# Patient Record
Sex: Female | Born: 1970 | Race: Black or African American | Hispanic: No | Marital: Single | Smoking: Never smoker
Health system: Southern US, Community
[De-identification: ages and names within clinical notes are randomized; demographics above are authoritative.]

## PROBLEM LIST (undated history)

## (undated) DIAGNOSIS — R12 Heartburn: Secondary | ICD-10-CM

## (undated) DIAGNOSIS — M349 Systemic sclerosis, unspecified: Secondary | ICD-10-CM

## (undated) DIAGNOSIS — K5792 Diverticulitis of intestine, part unspecified, without perforation or abscess without bleeding: Secondary | ICD-10-CM

## (undated) DIAGNOSIS — A6009 Herpesviral infection of other urogenital tract: Secondary | ICD-10-CM

## (undated) HISTORY — DX: Diverticulitis of intestine, part unspecified, without perforation or abscess without bleeding: K57.92

## (undated) HISTORY — DX: Systemic sclerosis, unspecified: M34.9

## (undated) HISTORY — PX: DILATION AND CURETTAGE OF UTERUS: SHX78

## (undated) HISTORY — PX: COLONOSCOPY: SHX174

---

## 2000-12-19 ENCOUNTER — Emergency Department (HOSPITAL_COMMUNITY): Admission: EM | Admit: 2000-12-19 | Discharge: 2000-12-19 | Payer: Self-pay | Admitting: Emergency Medicine

## 2000-12-19 ENCOUNTER — Encounter: Payer: Self-pay | Admitting: Emergency Medicine

## 2002-12-27 ENCOUNTER — Encounter: Payer: Self-pay | Admitting: Emergency Medicine

## 2002-12-27 ENCOUNTER — Emergency Department (HOSPITAL_COMMUNITY): Admission: EM | Admit: 2002-12-27 | Discharge: 2002-12-27 | Payer: Self-pay | Admitting: Emergency Medicine

## 2003-04-03 ENCOUNTER — Emergency Department (HOSPITAL_COMMUNITY): Admission: EM | Admit: 2003-04-03 | Discharge: 2003-04-03 | Payer: Self-pay | Admitting: Emergency Medicine

## 2004-10-22 ENCOUNTER — Emergency Department (HOSPITAL_COMMUNITY): Admission: EM | Admit: 2004-10-22 | Discharge: 2004-10-23 | Payer: Self-pay | Admitting: Emergency Medicine

## 2005-10-23 ENCOUNTER — Emergency Department (HOSPITAL_COMMUNITY): Admission: EM | Admit: 2005-10-23 | Discharge: 2005-10-24 | Payer: Self-pay | Admitting: *Deleted

## 2007-04-08 IMAGING — CT CT ABDOMEN W/ CM
1 of 3 series · 14 of 32 positions shown, 19 images · IV contrast (CONTRAST)
Comparison: None

ABDOMEN CT WITH CONTRAST

CLINICAL DATA: Abdominal pain
TECHNIQUE: Multidetector CT imaging of the abdomen and pelvis was performed
following the standard protocol during bolus administration of intravenous
contrast.

Contrast:  100 cc Omnipaque 300

[Series 528: — · axial · 0.63mm/px · z∈[+1219,+1579]mm · 14 of 82 slices shown, 19 images]
[im 5/82  soft-tissue]
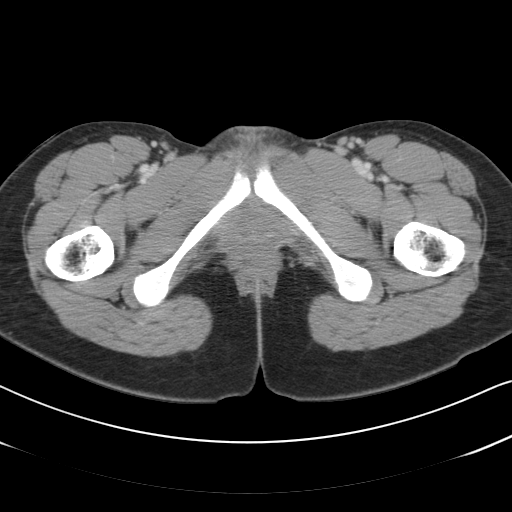
[im 5/82  bone]
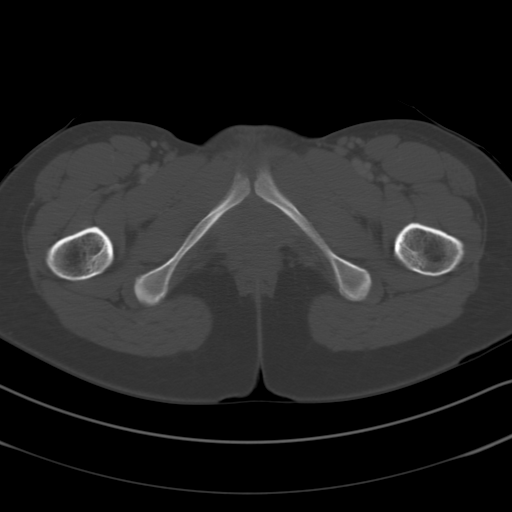
[im 10/82  soft-tissue]
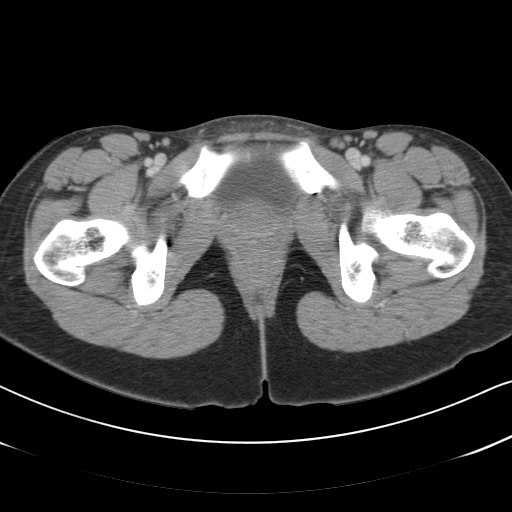
[im 20/82  soft-tissue]
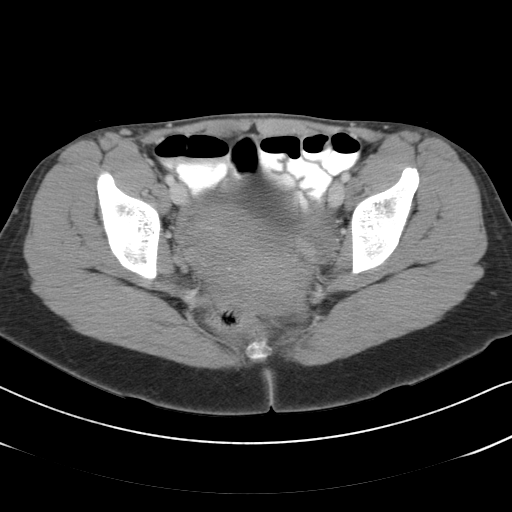
[im 24/82  soft-tissue]
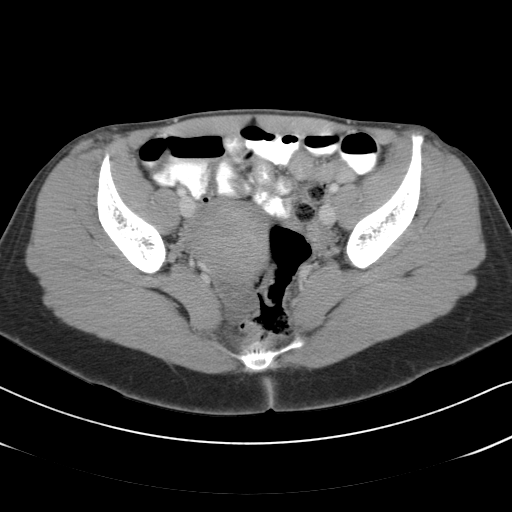
[im 29/82  soft-tissue]
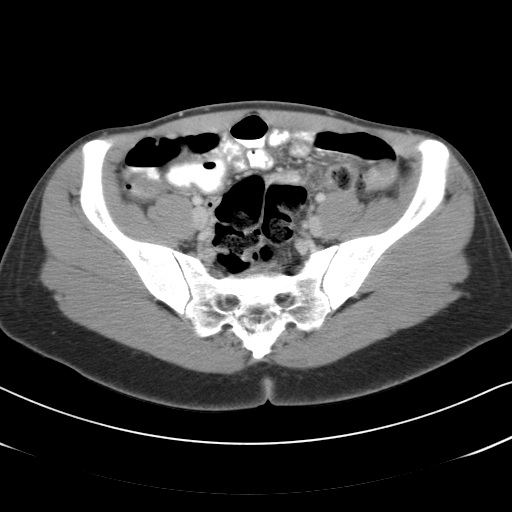
[im 34/82  soft-tissue]
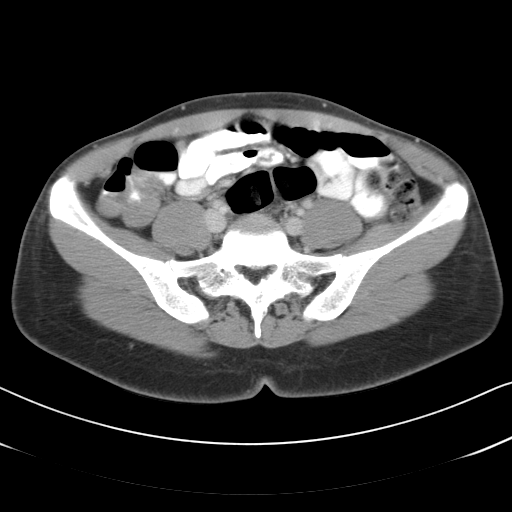
[im 43/82  soft-tissue]
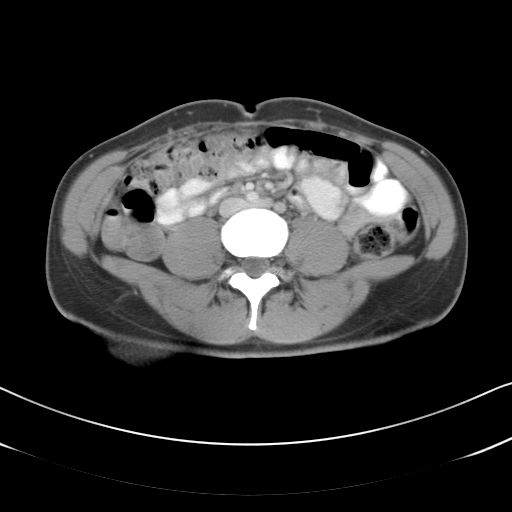
[im 48/82  soft-tissue]
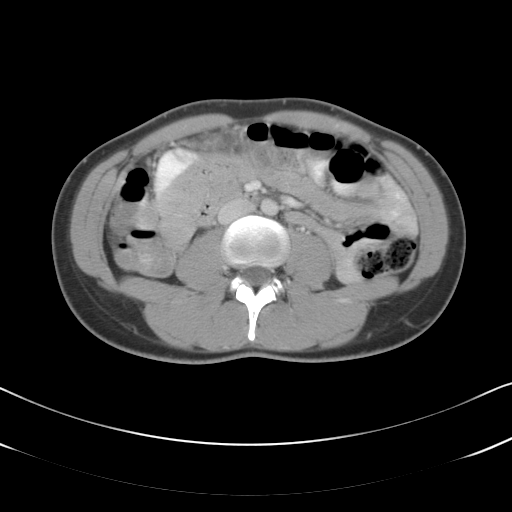
[im 53/82  soft-tissue]
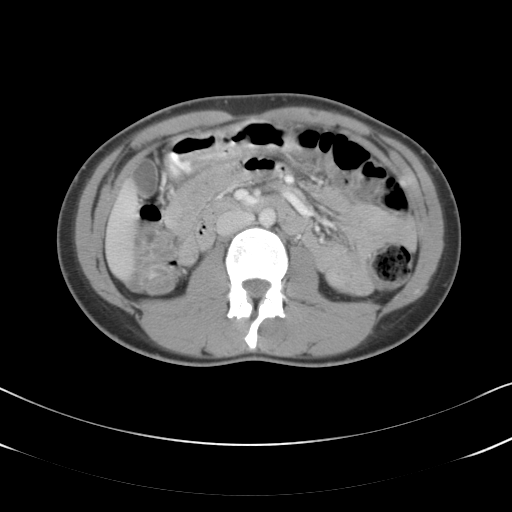
[im 53/82  bone]
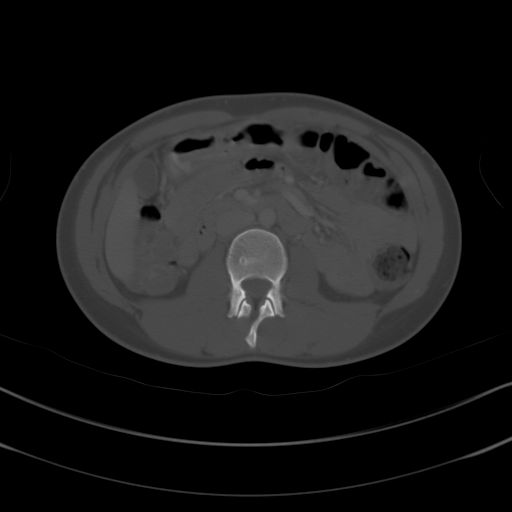
[im 58/82  soft-tissue]
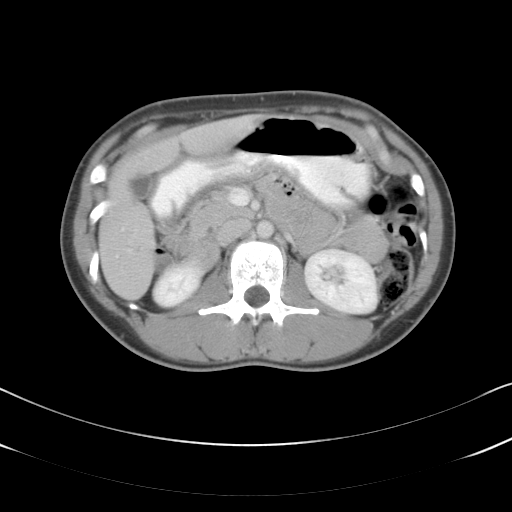
[im 62/82  soft-tissue]
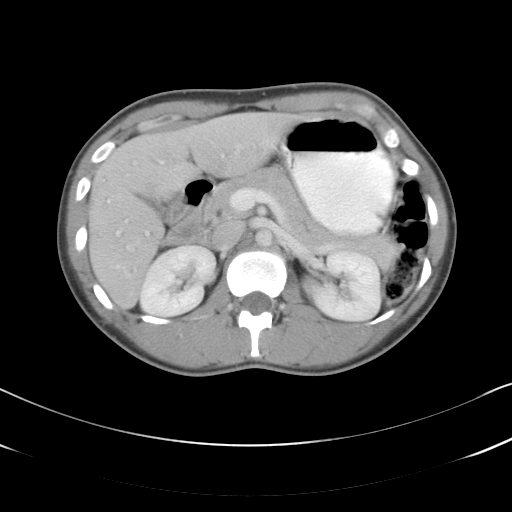
[im 62/82  lung]
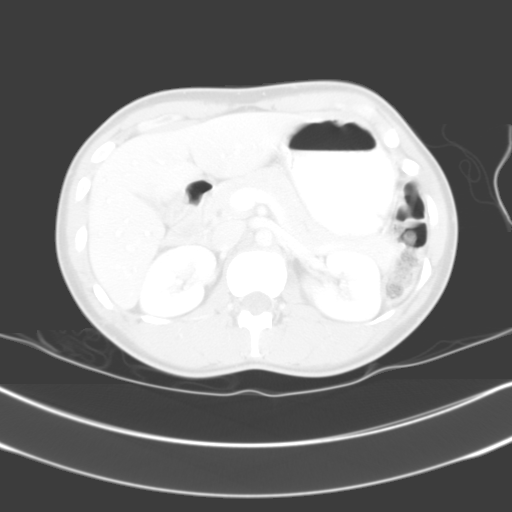
[im 67/82  lung]
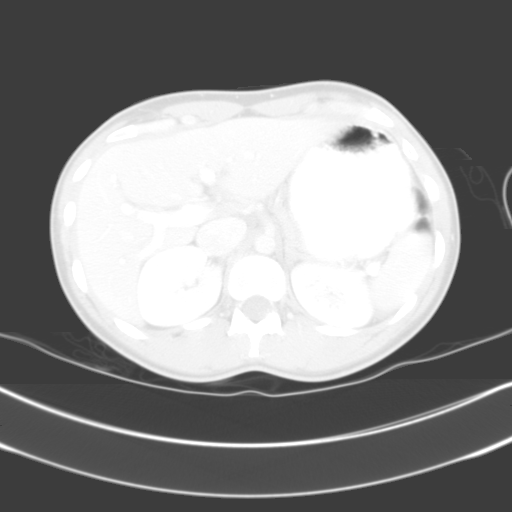
[im 72/82  soft-tissue]
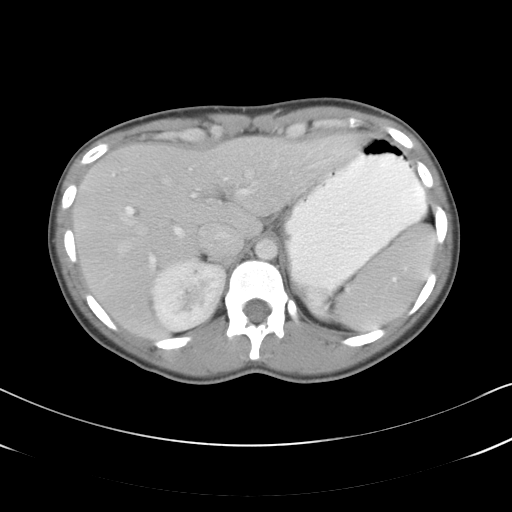
[im 72/82  lung]
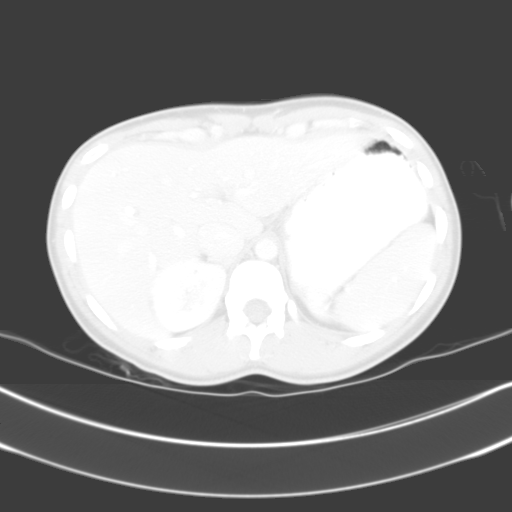
[im 77/82  soft-tissue]
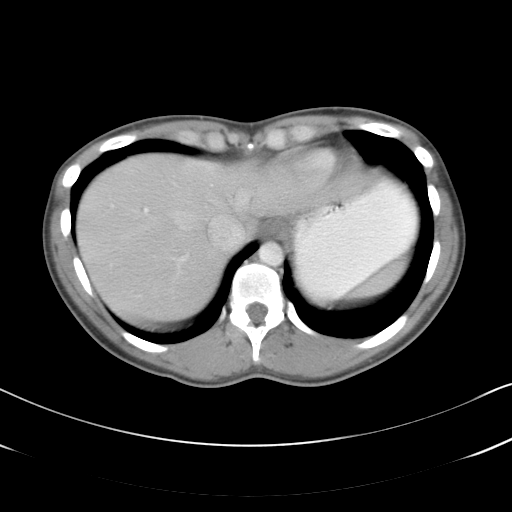
[im 77/82  lung]
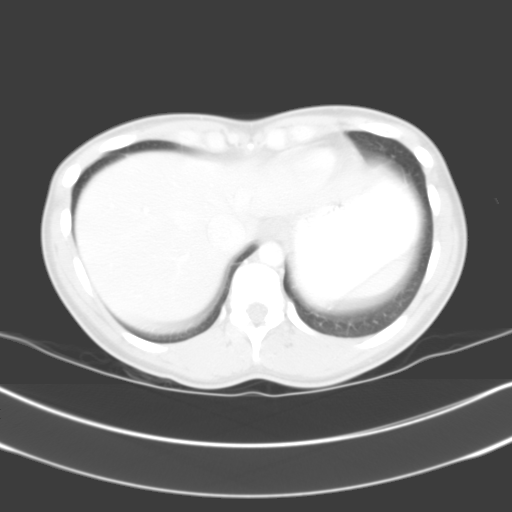

[14 of 32 positions shown; findings below may reference images not displayed]

FINDINGS: Solid organs have an unremarkable appearance. Gallbladder mildly
contracted but grossly unremarkable. Lung bases clear.

Although the colon is Decompressed, there appears to be mild wall thickening
within the right side of the colon and the transverse colon suggesting the
possibility of mild colitis. Moderate amount of stool within the left side of
the colon. No free fluid or free air.

IMPRESSION

Question mild colonic wall thickening in the right side of the colon and
transverse colon. The colon is decompressed and difficult to evaluate.

PELVIS CT WITH CONTRAST
FINDINGS: Appendix is filled with gas and is normal. There is a small to
moderate amount of free fluid in the pelvis. Within the right ovary, there
appears to be a partially collapsed cyst which could represent a ruptured cyst.
This measures 1.4 cm in greatest diameter. Uterus and left adnexa unremarkable.
Bowel grossly unremarkable within the pelvis.

IMPRESSION

Collapsing or ruptured right ovarian cyst. Small to moderate free fluid.

## 2010-04-26 ENCOUNTER — Emergency Department (HOSPITAL_COMMUNITY): Admission: EM | Admit: 2010-04-26 | Discharge: 2010-04-26 | Payer: Self-pay | Admitting: Emergency Medicine

## 2011-06-28 NOTE — L&D Delivery Note (Signed)
Delivery Note At 4:56 PM a viable female was delivered via Vaginal, Spontaneous Delivery (Presentation: Left Occiput Anterior).  APGAR: 8, 9; weight .   Placenta status: Intact, Spontaneous.  Cord: 3 vessels with the following complications: .    Anesthesia: Epidural  Episiotomy: None Lacerations: None Suture Repair: none Est. Blood Loss (mL): 300  Mom to postpartum.  Baby to mother's skin and stable.  Felix Pacini 05/02/2012, 5:30 PM

## 2011-10-31 ENCOUNTER — Other Ambulatory Visit (HOSPITAL_COMMUNITY)
Admission: RE | Admit: 2011-10-31 | Discharge: 2011-10-31 | Disposition: A | Discharge status: Home / Self Care | Payer: Medicaid Other | Source: Ambulatory Visit | Attending: Obstetrics and Gynecology | Admitting: Obstetrics and Gynecology

## 2011-10-31 DIAGNOSIS — Z1159 Encounter for screening for other viral diseases: Secondary | ICD-10-CM | POA: Insufficient documentation

## 2011-10-31 DIAGNOSIS — Z113 Encounter for screening for infections with a predominantly sexual mode of transmission: Secondary | ICD-10-CM | POA: Insufficient documentation

## 2011-10-31 DIAGNOSIS — Z01419 Encounter for gynecological examination (general) (routine) without abnormal findings: Secondary | ICD-10-CM | POA: Insufficient documentation

## 2011-10-31 LAB — OB RESULTS CONSOLE HEPATITIS B SURFACE ANTIGEN: Hepatitis B Surface Ag: NEGATIVE

## 2011-10-31 LAB — OB RESULTS CONSOLE ANTIBODY SCREEN: Antibody Screen: NEGATIVE

## 2011-10-31 LAB — OB RESULTS CONSOLE GBS: GBS: NEGATIVE

## 2011-10-31 LAB — OB RESULTS CONSOLE ABO/RH: RH Type: NEGATIVE

## 2011-10-31 LAB — OB RESULTS CONSOLE GC/CHLAMYDIA: Gonorrhea: NEGATIVE

## 2011-10-31 LAB — OB RESULTS CONSOLE RUBELLA ANTIBODY, IGM: Rubella: IMMUNE

## 2012-05-02 ENCOUNTER — Inpatient Hospital Stay (HOSPITAL_COMMUNITY)
Admission: AD | Admit: 2012-05-02 | Discharge: 2012-05-04 | DRG: 767 | Disposition: A | Discharge status: Home / Self Care | Payer: Medicaid Other | Source: Ambulatory Visit | Attending: Family Medicine | Admitting: Family Medicine

## 2012-05-02 ENCOUNTER — Encounter (HOSPITAL_COMMUNITY): Payer: Self-pay

## 2012-05-02 ENCOUNTER — Encounter (HOSPITAL_COMMUNITY): Payer: Self-pay | Admitting: Anesthesiology

## 2012-05-02 ENCOUNTER — Inpatient Hospital Stay (HOSPITAL_COMMUNITY): Payer: Medicaid Other | Admitting: Anesthesiology

## 2012-05-02 DIAGNOSIS — O09529 Supervision of elderly multigravida, unspecified trimester: Secondary | ICD-10-CM

## 2012-05-02 DIAGNOSIS — O4290 Premature rupture of membranes, unspecified as to length of time between rupture and onset of labor, unspecified weeks of gestation: Principal | ICD-10-CM | POA: Diagnosis present

## 2012-05-02 DIAGNOSIS — Z302 Encounter for sterilization: Secondary | ICD-10-CM

## 2012-05-02 HISTORY — DX: Herpesviral infection of other urogenital tract: A60.09

## 2012-05-02 LAB — POCT FERN TEST: POCT Fern Test: POSITIVE

## 2012-05-02 LAB — CBC
Hemoglobin: 11 g/dL — ABNORMAL LOW (ref 12.0–15.0)
RBC: 3.68 MIL/uL — ABNORMAL LOW (ref 3.87–5.11)
WBC: 8.7 10*3/uL (ref 4.0–10.5)

## 2012-05-02 LAB — RPR: RPR Ser Ql: NONREACTIVE

## 2012-05-02 MED ORDER — IBUPROFEN 600 MG PO TABS: 600.0000 mg | ORAL_TABLET | Freq: Four times a day (QID) | ORAL | Status: DC | PRN

## 2012-05-02 MED ORDER — EPHEDRINE 5 MG/ML INJ
10.0000 mg | INTRAVENOUS | Status: DC | PRN
  Filled 2012-05-02: qty 4

## 2012-05-02 MED ORDER — DIPHENHYDRAMINE HCL 25 MG PO CAPS: 25.0000 mg | ORAL_CAPSULE | Freq: Four times a day (QID) | ORAL | Status: DC | PRN

## 2012-05-02 MED ORDER — ACETAMINOPHEN 325 MG PO TABS
650.0000 mg | ORAL_TABLET | ORAL | Status: DC | PRN
Start: 2012-05-02 — End: 2012-05-02

## 2012-05-02 MED ORDER — LACTATED RINGERS IV SOLN
INTRAVENOUS | Status: DC
  Administered 2012-05-02 (×3): via INTRAVENOUS
  Administered 2012-05-02: 300 mL/h via INTRAVENOUS

## 2012-05-02 MED ORDER — PHENYLEPHRINE 40 MCG/ML (10ML) SYRINGE FOR IV PUSH (FOR BLOOD PRESSURE SUPPORT): 80.0000 ug | PREFILLED_SYRINGE | INTRAVENOUS | Status: DC | PRN

## 2012-05-02 MED ORDER — LIDOCAINE HCL (PF) 1 % IJ SOLN
INTRAMUSCULAR | Status: DC | PRN
  Administered 2012-05-02 (×4): 4 mL

## 2012-05-02 MED ORDER — FENTANYL 2.5 MCG/ML BUPIVACAINE 1/10 % EPIDURAL INFUSION (WH - ANES)
14.0000 mL/h | INTRAMUSCULAR | Status: DC
  Administered 2012-05-02: 14 mL/h via EPIDURAL
  Filled 2012-05-02: qty 125

## 2012-05-02 MED ORDER — PHENYLEPHRINE 40 MCG/ML (10ML) SYRINGE FOR IV PUSH (FOR BLOOD PRESSURE SUPPORT)
80.0000 ug | PREFILLED_SYRINGE | INTRAVENOUS | Status: DC | PRN
  Filled 2012-05-02: qty 5

## 2012-05-02 MED ORDER — WITCH HAZEL-GLYCERIN EX PADS: 1.0000 "application " | MEDICATED_PAD | CUTANEOUS | Status: DC | PRN

## 2012-05-02 MED ORDER — SENNOSIDES-DOCUSATE SODIUM 8.6-50 MG PO TABS
2.0000 | ORAL_TABLET | Freq: Every day | ORAL | Status: DC
  Administered 2012-05-02 – 2012-05-03 (×2): 2 via ORAL

## 2012-05-02 MED ORDER — LANOLIN HYDROUS EX OINT: TOPICAL_OINTMENT | CUTANEOUS | Status: DC | PRN

## 2012-05-02 MED ORDER — OXYCODONE-ACETAMINOPHEN 5-325 MG PO TABS: 1.0000 | ORAL_TABLET | ORAL | Status: DC | PRN

## 2012-05-02 MED ORDER — TETANUS-DIPHTH-ACELL PERTUSSIS 5-2.5-18.5 LF-MCG/0.5 IM SUSP: 0.5000 mL | Freq: Once | INTRAMUSCULAR | Status: DC

## 2012-05-02 MED ORDER — ONDANSETRON HCL 4 MG/2ML IJ SOLN: 4.0000 mg | Freq: Four times a day (QID) | INTRAMUSCULAR | Status: DC | PRN

## 2012-05-02 MED ORDER — OXYTOCIN 40 UNITS IN LACTATED RINGERS INFUSION - SIMPLE MED
1.0000 m[IU]/min | INTRAVENOUS | Status: DC
  Administered 2012-05-02: 2 m[IU]/min via INTRAVENOUS
  Filled 2012-05-02: qty 1000

## 2012-05-02 MED ORDER — LACTATED RINGERS IV SOLN: 500.0000 mL | Freq: Once | INTRAVENOUS | Status: DC

## 2012-05-02 MED ORDER — OXYTOCIN BOLUS FROM INFUSION: 500.0000 mL | INTRAVENOUS | Status: DC

## 2012-05-02 MED ORDER — LACTATED RINGERS IV SOLN: 500.0000 mL | INTRAVENOUS | Status: DC | PRN

## 2012-05-02 MED ORDER — ONDANSETRON HCL 4 MG PO TABS: 4.0000 mg | ORAL_TABLET | ORAL | Status: DC | PRN

## 2012-05-02 MED ORDER — IBUPROFEN 600 MG PO TABS
600.0000 mg | ORAL_TABLET | Freq: Four times a day (QID) | ORAL | Status: DC
  Administered 2012-05-02 – 2012-05-04 (×5): 600 mg via ORAL
  Filled 2012-05-02 (×5): qty 1

## 2012-05-02 MED ORDER — FLEET ENEMA 7-19 GM/118ML RE ENEM: 1.0000 | ENEMA | RECTAL | Status: DC | PRN

## 2012-05-02 MED ORDER — BENZOCAINE-MENTHOL 20-0.5 % EX AERO: 1.0000 "application " | INHALATION_SPRAY | CUTANEOUS | Status: DC | PRN

## 2012-05-02 MED ORDER — ZOLPIDEM TARTRATE 5 MG PO TABS: 5.0000 mg | ORAL_TABLET | Freq: Every evening | ORAL | Status: DC | PRN

## 2012-05-02 MED ORDER — ONDANSETRON HCL 4 MG/2ML IJ SOLN: 4.0000 mg | INTRAMUSCULAR | Status: DC | PRN

## 2012-05-02 MED ORDER — PRENATAL MULTIVITAMIN CH
1.0000 | ORAL_TABLET | Freq: Every day | ORAL | Status: DC
  Administered 2012-05-03 – 2012-05-04 (×2): 1 via ORAL
  Filled 2012-05-02 (×4): qty 1

## 2012-05-02 MED ORDER — DIPHENHYDRAMINE HCL 50 MG/ML IJ SOLN: 12.5000 mg | INTRAMUSCULAR | Status: DC | PRN

## 2012-05-02 MED ORDER — CITRIC ACID-SODIUM CITRATE 334-500 MG/5ML PO SOLN: 30.0000 mL | ORAL | Status: DC | PRN

## 2012-05-02 MED ORDER — LIDOCAINE HCL (PF) 1 % IJ SOLN
30.0000 mL | INTRAMUSCULAR | Status: DC | PRN
  Filled 2012-05-02: qty 30

## 2012-05-02 MED ORDER — EPHEDRINE 5 MG/ML INJ: 10.0000 mg | INTRAVENOUS | Status: DC | PRN

## 2012-05-02 MED ORDER — DIBUCAINE 1 % RE OINT: 1.0000 "application " | TOPICAL_OINTMENT | RECTAL | Status: DC | PRN

## 2012-05-02 MED ORDER — TERBUTALINE SULFATE 1 MG/ML IJ SOLN: 0.2500 mg | Freq: Once | INTRAMUSCULAR | Status: DC | PRN

## 2012-05-02 MED ORDER — SIMETHICONE 80 MG PO CHEW
80.0000 mg | CHEWABLE_TABLET | ORAL | Status: DC | PRN
Start: 2012-05-02 — End: 2012-05-04

## 2012-05-02 MED ORDER — OXYTOCIN 40 UNITS IN LACTATED RINGERS INFUSION - SIMPLE MED: 62.5000 mL/h | INTRAVENOUS | Status: DC

## 2012-05-02 NOTE — MAU Provider Note (Signed)
History     CSN: [id number]  Arrival date and time: 05/02/12 0234   None     Chief Complaint  Patient presents with  . Labor Eval   HPI Patient is a 41 yo G3P1011 at 39.0 presenting for ROM. Patient states she had a gush of clear fluid at 0100 and has been leaking since then. She denies contractions other than some irregular cramping. Good fetal movement, no frank bleeding. Patient is followed by [facility]. She is GBS negative, Rh negative and HSV positive. She did receive her 28 week RhoGam and has been on Valtrex since 34 weeks with no sign of an outbreak. We will need to obtain updated prenatal records from [facility] during clinic hours as patient states she has signed BTL papers.   She would like to breast feed. Her Pediatrician is in [place], [place]. She desires BTL for contraception.   OB History    Grav Para Term Preterm Abortions TAB SAB Ect Mult Living   3 1 1  1  1          Past Medical History  Diagnosis Date  . Herpes simplex of female genitalia     Past Surgical History  Procedure Date  . Dilation and curettage of uterus     History reviewed. No pertinent family history.  History  Substance Use Topics  . Smoking status: Never Smoker   . Smokeless tobacco: Not on file  . Alcohol Use: No    Allergies:  Allergies  Allergen Reactions  . Penicillins Swelling    Prescriptions prior to admission  Medication Sig Dispense Refill  . ferrous fumarate (FERRO-SEQUELS) 50 MG CR tablet Take 50 mg by mouth 3 (three) times daily with meals.      Marland Kitchen omeprazole (PRILOSEC) 10 MG capsule Take 10 mg by mouth daily.      . Prenatal Vit-Fe Fumarate-FA (MULTIVITAMIN-PRENATAL) 27-0.8 MG TABS Take 1 tablet by mouth daily.      . valACYclovir (VALTREX) 500 MG tablet Take 500 mg by mouth 2 (two) times daily.        Review of Systems  Constitutional: Negative for fever and chills.  Respiratory: Negative for shortness of breath.   Cardiovascular: Negative for chest  pain.  Gastrointestinal: Negative for nausea and vomiting.  Genitourinary: Negative for dysuria.  Musculoskeletal: Positive for back pain.  Skin: Negative for rash.  Neurological: Negative for dizziness and headaches.   Physical Exam   Blood pressure 125/78, pulse 88, temperature 97.8 F (36.6 C), temperature source Oral, resp. rate 18, height 5\' 5"  (1.651 m), weight 75.116 kg (165 lb 9.6 oz), last menstrual period 08/03/2011, SpO2 100.00%.  Physical Exam  Constitutional: She is oriented to person, place, and time. She appears well-developed and well-nourished. No distress.  HENT:  Head: Normocephalic and atraumatic.  Cardiovascular: Normal rate and regular rhythm.   Respiratory: Effort normal and breath sounds normal.  GI: Soft.       Gravid. Toco in place.  Musculoskeletal: Normal range of motion. She exhibits no edema and no tenderness.  Neurological: She is alert and oriented to person, place, and time.  Skin: Skin is dry.  Psychiatric: She has a normal mood and affect.    MAU Course  Procedures  Results for orders placed during the hospital encounter of 05/02/12 (from the past 24 hour(s))  POCT FERN TEST     Status: Normal   Collection Time   05/02/12  3:23 AM      Component  Value Range   POCT Fern Test Positive = ruptured amniotic membanes     SVE deferred since she is grossly ruptured without any pressure. Bedside ultrasound confirms vertex.  MDM Will admit to L&D  Assessment and Plan  41 yo G3P1011 at 39.0 admitted for ROM  Admit to L&D, attending Dr. Shawnie Pons Will need RhoGam for RhNeg Will get remainder of prenatal records during [facility] business hours for BTL papers and confirm GBS Expectant management, anticipate NSVD Discussed with Philipp Deputy, CNM   HAIRFORD, AMBER 05/02/2012, 3:44 AM   I have seen and examined this patient and I agree with the above. Cam Hai 7:38 AM 05/02/2012

## 2012-05-02 NOTE — H&P (Signed)
Chart reviewed and agree with management and plan.  

## 2012-05-02 NOTE — Progress Notes (Signed)
Patient ID: Erin Higgins, female   DOB: February 21, 1971, 41 y.o.   MRN: [id number] Erin Higgins is a 41 y.o. G3P1011 at [redacted]w[redacted]d by ultrasound admitted for rupture of membranes  Subjective: Patient is comfortable with epidural.   Objective: BP 101/66  Pulse 95  Temp 98 F (36.7 C) (Oral)  Resp 18  Ht 5\' 5"  (1.651 m)  Wt 75.099 kg (165 lb 9 oz)  BMI 27.55 kg/m2  SpO2 100%  LMP 08/03/2011     FHT:  FHR: 130's bpm, variability: moderate,  accelerations:  Present,  decelerations:  Absent UC:   irregular SVE:   Dilation: 6 Effacement (%): 80 Station: 0 Exam by:: B.Smith RN  Labs: Lab Results  Component Value Date   WBC 8.7 05/02/2012   HGB 11.0* 05/02/2012   HCT 32.1* 05/02/2012   MCV 87.2 05/02/2012   PLT 202 05/02/2012    Assessment / Plan: PROM Pitocin augmentation started on admission  Labor: Progressing normally and pitocin  Preeclampsia:  labs stable Fetal Wellbeing:  Category I Pain Control: epidural I/D:  n/a Anticipated MOD:  NSVD  Erin Higgins 05/02/2012, 4:34 PM

## 2012-05-02 NOTE — MAU Provider Note (Signed)
Chart reviewed and agree with management and plan.  

## 2012-05-02 NOTE — MAU Note (Signed)
Pt states she thinks her water broke around 0100. Pt states the fluid was clear with a tinge of blood. States she is having some contractions about every 15 minutes

## 2012-05-02 NOTE — Anesthesia Procedure Notes (Signed)
Epidural Patient location during procedure: OB Start time: 05/02/2012 1:37 PM  Staffing Performed by: anesthesiologist   Preanesthetic Checklist Completed: patient identified, site marked, surgical consent, pre-op evaluation, timeout performed, IV checked, risks and benefits discussed and monitors and equipment checked  Epidural Patient position: sitting Prep: site prepped and draped and DuraPrep Patient monitoring: continuous pulse ox and blood pressure Approach: midline Injection technique: LOR air  Needle:  Needle type: Tuohy  Needle gauge: 17 G Needle length: 9 cm and 9 Needle insertion depth: 5 cm cm Catheter type: closed end flexible Catheter size: 19 Gauge Catheter at skin depth: 10 cm Test dose: negative  Assessment Events: blood not aspirated, injection not painful, no injection resistance, negative IV test and no paresthesia  Additional Notes Discussed risk of headache, infection, bleeding, nerve injury and failed or incomplete block.  Patient voices understanding and wishes to proceed. Reason for block:procedure for pain

## 2012-05-02 NOTE — Progress Notes (Signed)
Erin Higgins is a 41 y.o. G3P1011 at [redacted]w[redacted]d by ultrasound admitted for rupture of membranes  Subjective: Patient is comfortable. Anticipating delivery, her last delivery was 12 years ago.   Objective: BP 112/71  Pulse 85  Temp 98.4 F (36.9 C) (Oral)  Resp 18  Ht 5\' 5"  (1.651 m)  Wt 75.099 kg (165 lb 9 oz)  BMI 27.55 kg/m2  SpO2 100%  LMP 08/03/2011      FHT:  FHR: 130's bpm, variability: moderate,  accelerations:  Present,  decelerations:  Absent UC:   irregular SVE:   Dilation: 2 Effacement (%): 50 Station: -3 Exam by:: B.Smith RN  Labs: Lab Results  Component Value Date   WBC 8.7 05/02/2012   HGB 11.0* 05/02/2012   HCT 32.1* 05/02/2012   MCV 87.2 05/02/2012   PLT 202 05/02/2012    Assessment / Plan: PROM Start Pitocin augmantation  Labor: pitocin started Preeclampsia:  labs stable Fetal Wellbeing:  Category I Pain Control:  IV pain meds available  I/D:  n/a Anticipated MOD:  NSVD  Kuneff, Renee 05/02/2012, 11:01 AM

## 2012-05-02 NOTE — Anesthesia Preprocedure Evaluation (Signed)
Anesthesia Evaluation  Patient identified by MRN, date of birth, ID band Patient awake    Reviewed: Allergy & Precautions, H&P , NPO status , Patient's Chart, lab work & pertinent test results, reviewed documented beta blocker date and time   History of Anesthesia Complications Negative for: history of anesthetic complications  Airway Mallampati: III TM Distance: >3 FB Neck ROM: full    Dental  (+) Teeth Intact   Pulmonary neg pulmonary ROS,  breath sounds clear to auscultation        Cardiovascular negative cardio ROS  Rhythm:regular Rate:Normal     Neuro/Psych negative neurological ROS  negative psych ROS   GI/Hepatic Neg liver ROS, GERD-  Medicated,  Endo/Other  negative endocrine ROS  Renal/GU negative Renal ROS     Musculoskeletal   Abdominal   Peds  Hematology negative hematology ROS (+)   Anesthesia Other Findings   Reproductive/Obstetrics (+) Pregnancy                           Anesthesia Physical Anesthesia Plan  ASA: II  Anesthesia Plan: Epidural   Post-op Pain Management:    Induction:   Airway Management Planned:   Additional Equipment:   Intra-op Plan:   Post-operative Plan:   Informed Consent: I have reviewed the patients History and Physical, chart, labs and discussed the procedure including the risks, benefits and alternatives for the proposed anesthesia with the patient or authorized representative who has indicated his/her understanding and acceptance.     Plan Discussed with:   Anesthesia Plan Comments:         Anesthesia Quick Evaluation  

## 2012-05-02 NOTE — H&P (Signed)
Erin Higgins is a 41 y.o. female G3P1011 at 39.0 presenting for ROM. Patient states she had a gush of clear fluid at 0100 and has been leaking since then. She denies contractions other than some irregular cramping. Good fetal movement, no frank bleeding. Patient is followed by [facility]. She is GBS negative, Rh negative and HSV positive. She did receive her 28 week RhoGam and has been on Valtrex since 34 weeks with no sign of an outbreak. We will need to obtain updated prenatal records from [facility] during clinic hours as patient states she has signed BTL papers.   She would like to breast feed. Her Pediatrician is in [place], [place]. She desires BTL for contraception. . Maternal Medical History:  Reason for admission: Reason for Admission:   nausea  OB History    Grav Para Term Preterm Abortions TAB SAB Ect Mult Living   3 1 1  1  1   1      Past Medical History  Diagnosis Date  . Herpes simplex of female genitalia    Past Surgical History  Procedure Date  . Dilation and curettage of uterus    Family History: family history is not on file. Social History:  reports that she has never smoked. She does not have any smokeless tobacco history on file. She reports that she does not drink alcohol or use illicit drugs.  Prenatal Transfer Tool  Maternal Diabetes: No Genetic Screening: Normal Maternal Ultrasounds/Referrals: Normal Fetal Ultrasounds or other Referrals:  None Maternal Substance Abuse:  No Significant Maternal Medications:  None Significant Maternal Lab Results:  Lab values include: Group B Strep negative, Rh negative, Other: HSV positive Other Comments:  None  Review of Systems  Constitutional: Negative for fever and chills.  Respiratory: Negative for shortness of breath.   Cardiovascular: Negative for chest pain.  Gastrointestinal: Negative for nausea and vomiting.  Genitourinary: Negative for dysuria.  Musculoskeletal: Positive for back pain.  Skin:  Negative for rash.  Neurological: Negative for dizziness and headaches.      Blood pressure 125/78, pulse 88, temperature 97.8 F (36.6 C), temperature source Oral, resp. rate 18, height 5\' 5"  (1.651 m), weight 75.116 kg (165 lb 9.6 oz), last menstrual period 08/03/2011, SpO2 100.00%. Exam Physical Exam  Constitutional: She is oriented to person, place, and time. She appears well-developed and well-nourished. No distress.  HENT:  Head: Normocephalic and atraumatic.  Neck: Normal range of motion.  Cardiovascular: Normal rate and regular rhythm.   No murmur heard. Respiratory: Effort normal and breath sounds normal. She has no wheezes.  GI: Soft.       Gravid. Toco in place.  Musculoskeletal: Normal range of motion. She exhibits no edema and no tenderness.  Neurological: She is alert and oriented to person, place, and time. No cranial nerve deficit.  Skin: Skin is dry. No rash noted.    Prenatal labs: ABO, Rh:  O neg (received 28 week Rhogam) Antibody:  Neg Rubella:  Immune RPR:   Neg HBsAg:   Neg HIV:   Neg GBS:   Neg  Assessment/Plan: 41 yo G3P1011 at 39.0 admitted for ROM Admit to L&D, attending Dr. Andrey Cota confirmed with bedside ultrasound Will need RhoGam for RhNeg  Will get remainder of prenatal records during [facility] business hours for BTL papers and confirm GBS  Breast feed Faculty peds Would like IV pain medications Expectant management, anticipate NSVD  Discussed with Philipp Deputy, CNM   HAIRFORD, AMBER 05/02/2012, 4:08  AM  I have seen and examined this patient and I agree with the above. Cam Hai 7:33 AM 05/02/2012

## 2012-05-03 ENCOUNTER — Inpatient Hospital Stay (HOSPITAL_COMMUNITY): Payer: Medicaid Other | Admitting: Anesthesiology

## 2012-05-03 ENCOUNTER — Encounter (HOSPITAL_COMMUNITY)
Admission: AD | Disposition: A | Discharge status: Home / Self Care | Payer: Self-pay | Source: Ambulatory Visit | Attending: Family Medicine

## 2012-05-03 ENCOUNTER — Encounter (HOSPITAL_COMMUNITY): Payer: Self-pay | Admitting: Anesthesiology

## 2012-05-03 DIAGNOSIS — Z302 Encounter for sterilization: Secondary | ICD-10-CM

## 2012-05-03 HISTORY — PX: TUBAL LIGATION: SHX77

## 2012-05-03 LAB — ABO/RH: ABO/RH(D): O NEG

## 2012-05-03 LAB — CBC
MCH: 27.9 pg (ref 26.0–34.0)
MCHC: 32.1 g/dL (ref 30.0–36.0)
MCV: 86.8 fL (ref 78.0–100.0)
Platelets: 192 10*3/uL (ref 150–400)
RDW: 15.5 % (ref 11.5–15.5)

## 2012-05-03 SURGERY — LIGATION, FALLOPIAN TUBE, POSTPARTUM
Anesthesia: Epidural | Site: Abdomen | Laterality: Bilateral | Wound class: Clean

## 2012-05-03 MED ORDER — MIDAZOLAM HCL 2 MG/2ML IJ SOLN: 0.5000 mg | Freq: Once | INTRAMUSCULAR | Status: DC | PRN

## 2012-05-03 MED ORDER — MIDAZOLAM HCL 5 MG/5ML IJ SOLN
INTRAMUSCULAR | Status: DC | PRN
  Administered 2012-05-03: 2 mg via INTRAVENOUS

## 2012-05-03 MED ORDER — MEPERIDINE HCL 25 MG/ML IJ SOLN: 6.2500 mg | INTRAMUSCULAR | Status: DC | PRN

## 2012-05-03 MED ORDER — LIDOCAINE-EPINEPHRINE (PF) 2 %-1:200000 IJ SOLN
INTRAMUSCULAR | Status: AC
  Filled 2012-05-03: qty 20

## 2012-05-03 MED ORDER — SODIUM BICARBONATE 8.4 % IV SOLN
INTRAVENOUS | Status: AC
  Filled 2012-05-03: qty 50

## 2012-05-03 MED ORDER — PROMETHAZINE HCL 25 MG/ML IJ SOLN: 6.2500 mg | INTRAMUSCULAR | Status: DC | PRN

## 2012-05-03 MED ORDER — METOCLOPRAMIDE HCL 10 MG PO TABS
10.0000 mg | ORAL_TABLET | Freq: Once | ORAL | Status: AC
  Administered 2012-05-03: 10 mg via ORAL
  Filled 2012-05-03: qty 1

## 2012-05-03 MED ORDER — SODIUM CHLORIDE 0.9 % IV SOLN: INTRAVENOUS | Status: DC

## 2012-05-03 MED ORDER — BUPIVACAINE HCL (PF) 0.5 % IJ SOLN
INTRAMUSCULAR | Status: AC
  Filled 2012-05-03: qty 30

## 2012-05-03 MED ORDER — SODIUM BICARBONATE 8.4 % IV SOLN
INTRAVENOUS | Status: DC | PRN
  Administered 2012-05-03: 5 mL via EPIDURAL
  Administered 2012-05-03: 3 mL via EPIDURAL

## 2012-05-03 MED ORDER — KETOROLAC TROMETHAMINE 30 MG/ML IJ SOLN: 15.0000 mg | Freq: Once | INTRAMUSCULAR | Status: DC | PRN

## 2012-05-03 MED ORDER — BUPIVACAINE HCL 0.5 % IJ SOLN
INTRAMUSCULAR | Status: DC | PRN
  Administered 2012-05-03: 10 mL

## 2012-05-03 MED ORDER — 0.9 % SODIUM CHLORIDE (POUR BTL) OPTIME
TOPICAL | Status: DC | PRN
  Administered 2012-05-03: 1000 mL

## 2012-05-03 MED ORDER — KETOROLAC TROMETHAMINE 30 MG/ML IJ SOLN
INTRAMUSCULAR | Status: DC | PRN
  Administered 2012-05-03: 30 mg via INTRAVENOUS

## 2012-05-03 MED ORDER — KETOROLAC TROMETHAMINE 30 MG/ML IJ SOLN
INTRAMUSCULAR | Status: AC
  Filled 2012-05-03: qty 1

## 2012-05-03 MED ORDER — RHO D IMMUNE GLOBULIN 1500 UNIT/2ML IJ SOLN
300.0000 ug | Freq: Once | INTRAMUSCULAR | Status: AC
  Administered 2012-05-03: 300 ug via INTRAMUSCULAR
  Filled 2012-05-03: qty 2

## 2012-05-03 MED ORDER — FAMOTIDINE 20 MG PO TABS
40.0000 mg | ORAL_TABLET | Freq: Once | ORAL | Status: AC
  Administered 2012-05-03: 40 mg via ORAL
  Filled 2012-05-03 (×2): qty 1

## 2012-05-03 MED ORDER — FENTANYL CITRATE 0.05 MG/ML IJ SOLN: 25.0000 ug | INTRAMUSCULAR | Status: DC | PRN

## 2012-05-03 MED ORDER — MIDAZOLAM HCL 2 MG/2ML IJ SOLN
INTRAMUSCULAR | Status: AC
  Filled 2012-05-03: qty 2

## 2012-05-03 MED ORDER — LACTATED RINGERS IV SOLN
INTRAVENOUS | Status: DC
  Administered 2012-05-03: 15:00:00 via INTRAVENOUS

## 2012-05-03 SURGICAL SUPPLY — 26 items
ADH SKN CLS APL DERMABOND .7 (GAUZE/BANDAGES/DRESSINGS) ×1
APL SKNCLS STERI-STRIP NONHPOA (GAUZE/BANDAGES/DRESSINGS) ×1
BENZOIN TINCTURE PRP APPL 2/3 (GAUZE/BANDAGES/DRESSINGS) ×1 IMPLANT
CATH ROBINSON RED A/P 16FR (CATHETERS) ×2 IMPLANT
CHLORAPREP W/TINT 26ML (MISCELLANEOUS) ×2 IMPLANT
CLIP FILSHIE TUBAL LIGA STRL (Clip) ×1 IMPLANT
CLOTH BEACON ORANGE TIMEOUT ST (SAFETY) ×2 IMPLANT
CONTAINER PREFILL 10% NBF 15ML (MISCELLANEOUS) ×2 IMPLANT
DERMABOND ADVANCED (GAUZE/BANDAGES/DRESSINGS) ×1
DERMABOND ADVANCED .7 DNX12 (GAUZE/BANDAGES/DRESSINGS) ×1 IMPLANT
DRSG COVADERM PLUS 2X2 (GAUZE/BANDAGES/DRESSINGS) ×1 IMPLANT
GLOVE ECLIPSE 7.0 STRL STRAW (GLOVE) ×2 IMPLANT
GLOVE INDICATOR 7.0 STRL GRN (GLOVE) ×4 IMPLANT
GOWN PREVENTION PLUS LG XLONG (DISPOSABLE) ×2 IMPLANT
GOWN STRL REIN XL XLG (GOWN DISPOSABLE) ×2 IMPLANT
NDL HYPO 25X1 1.5 SAFETY (NEEDLE) IMPLANT
NEEDLE HYPO 25X1 1.5 SAFETY (NEEDLE) ×2 IMPLANT
NS IRRIG 1000ML POUR BTL (IV SOLUTION) ×2 IMPLANT
PACK ABDOMINAL MINOR (CUSTOM PROCEDURE TRAY) ×2 IMPLANT
STRIP CLOSURE SKIN 1/4X4 (GAUZE/BANDAGES/DRESSINGS) ×1 IMPLANT
SUT VIC AB 0 CT1 27 (SUTURE) ×2
SUT VIC AB 0 CT1 27XBRD ANBCTR (SUTURE) ×1 IMPLANT
SUT VIC AB 4-0 PS2 27 (SUTURE) ×2 IMPLANT
SYR CONTROL 10ML LL (SYRINGE) ×1 IMPLANT
TOWEL OR 17X24 6PK STRL BLUE (TOWEL DISPOSABLE) ×4 IMPLANT
WATER STERILE IRR 1000ML POUR (IV SOLUTION) ×1 IMPLANT

## 2012-05-03 NOTE — Anesthesia Postprocedure Evaluation (Signed)
Anesthesia Post Note  Patient: Erin Higgins  Procedure(s) Performed: Procedure(s) (LRB): POST PARTUM TUBAL LIGATION (Bilateral)  Anesthesia type: Epidural  Patient location: PACU  Post pain: Pain level controlled  Post assessment: Post-op Vital signs reviewed  Last Vitals:  Filed Vitals:   05/03/12 1700  BP: 96/60  Pulse: 64  Temp:   Resp: 17    Post vital signs: Reviewed  Level of consciousness: awake  Complications: No apparent anesthesia complications

## 2012-05-03 NOTE — Op Note (Signed)
05/02/2012 - 05/03/2012  3:57 PM  PATIENT:  Erin Higgins  41 y.o. female  PRE-OPERATIVE DIAGNOSIS:  desire sterilization  POST-OPERATIVE DIAGNOSIS:  desire sterilization  PROCEDURE:  Procedure(s) (LRB) with comments: POST PARTUM TUBAL LIGATION (Bilateral) - with filshie clips  SURGEON:  Surgeon(s) and Role:    * Willodean Rosenthal, MD - Primary  PHYSICIAN ASSISTANT:   ASSISTANTS: none  ANESTHESIA:   epidural  EBL:  Total I/O In: 400 [I.V.:400] Out: 603 [Urine:600; Blood:3]  BLOOD ADMINISTERED:none  DRAINS: none   LOCAL MEDICATIONS USED:  MARCAINE     SPECIMEN:  No Specimen  DISPOSITION OF SPECIMEN:  N/A  COUNTS:  YES  TOURNIQUET:  * No tourniquets in log *  DICTATION: .Note written in EPIC  PLAN OF CARE: transfer back to mothe/baby unit    PATIENT DISPOSITION:  PACU - hemodynamically stable.   Delay start of Pharmacological VTE agent (>24hrs) due to surgical blood loss or risk of bleeding: yes  URINE OUTPUT:  100 ml of clear urine.  INDICATIONS: 41 y.o. [id number]  with undesired fertility,status post vaginal delivery, desires permanent sterilization.  Other reversible forms of contraception were discussed with patient; she declines all other modalities. Risks of procedure discussed with patient including but not limited to: risk of regret, permanence of method, bleeding, infection, injury to surrounding organs and need for additional procedures.  Failure risk of 0.5-1% with increased risk of ectopic gestation if pregnancy occurs was also discussed with patient.     FINDINGS:  Normal uterus, tubes, and ovaries.  PROCEDURE DETAILS: The patient was taken to the operating room where her epidural anesthesia was dosed up to surgical level and found to be adequate.  She was then placed in the dorsal supine position and prepped and draped in sterile fashion.  After an adequate timeout was performed, attention was turned to the patient's abdomen where a small  transverse skin incision was made under the umbilical fold. The incision was taken down to the layer of fascia using the scalpel, and fascia was incised, and extended bilaterally using Mayo scissors. The peritoneum was entered in a sharp fashion. Attention was then turned to the patient's uterus, and left fallopian tube was identified and followed out to the fimbriated end.  A Filshie clip was placed on the left fallopian tube about 3 cm from the cornual attachment, with care given to incorporate the underlying mesosalpinx.  A similar process was carried out on the right side allowing for bilateral tubal sterilization.  Good hemostasis was noted overall.  The instruments were then removed from the patient's abdomen and the fascial incision was repaired with 0 Vicryl, and the skin was closed with a 4-0 Vicryl subcuticular stitch. The patient tolerated the procedure well.  Instrument, sponge, and needle counts were correct times two.  The patient was then taken to the recovery room awake and in stable condition.

## 2012-05-03 NOTE — Transfer of Care (Signed)
Immediate Anesthesia Transfer of Care Note  Patient: Erin Higgins  Procedure(s) Performed: Procedure(s) (LRB) with comments: POST PARTUM TUBAL LIGATION (Bilateral) - with filshie clips  Patient Location: PACU  Anesthesia Type:Epidural  Level of Consciousness: oriented, sedated and patient cooperative  Airway & Oxygen Therapy: Patient Spontanous Breathing  Post-op Assessment: Report given to PACU RN and Post -op Vital signs reviewed and stable  Post vital signs: Reviewed and stable  Complications: No apparent anesthesia complications

## 2012-05-03 NOTE — Progress Notes (Signed)
Post Partum Day 1 Subjective: no complaints, up ad lib, voiding and NPO for Tubal ligation this afternoon at 2:30 pm. Consent signed and 30-day papers copy in chart. Patient confirms her  Desire for permanent sterilization.   Objective: Blood pressure 102/69, pulse 79, temperature 97.9 F (36.6 C), temperature source Oral, resp. rate 18, height 5\' 5"  (1.651 m), weight 165 lb 9 oz (75.099 kg), last menstrual period 08/03/2011, SpO2 100.00%, unknown if currently breastfeeding.  Physical Exam:  General: alert, cooperative and no distress Lochia: appropriate Uterine Fundus: deep in pelvis, u-4 Incision: n/a DVT Evaluation: No evidence of DVT seen on physical exam.   Basename 05/03/12 0535 05/02/12 0445  HGB 9.5* 11.0*  HCT 29.6* 32.1*    Assessment/Plan: Plan for discharge tomorrow and Contraception Tubal ligation this p.m. at 2:30.   LOS: 1 day   Erin Higgins V 05/03/2012, 7:37 AM

## 2012-05-03 NOTE — Progress Notes (Signed)
UR chart review completed.  

## 2012-05-03 NOTE — Plan of Care (Signed)
Problem: Consults Goal: Skin Care Protocol Initiated - if Braden Score 18 or less If consults are not indicated, leave blank or document N/A  Outcome: Completed/Met Date Met:  05/03/12 Small tubal incision covered with 2x2's

## 2012-05-03 NOTE — Anesthesia Preprocedure Evaluation (Signed)
Anesthesia Evaluation  Patient identified by MRN, date of birth, ID band Patient awake    Reviewed: Allergy & Precautions, H&P , NPO status , Patient's Chart, lab work & pertinent test results, reviewed documented beta blocker date and time   History of Anesthesia Complications Negative for: history of anesthetic complications  Airway Mallampati: II TM Distance: >3 FB Neck ROM: full    Dental  (+) Teeth Intact   Pulmonary neg pulmonary ROS,  breath sounds clear to auscultation  Pulmonary exam normal       Cardiovascular negative cardio ROS  Rhythm:regular Rate:Normal     Neuro/Psych negative neurological ROS  negative psych ROS   GI/Hepatic Neg liver ROS, GERD-  Medicated,  Endo/Other  negative endocrine ROS  Renal/GU negative Renal ROS     Musculoskeletal   Abdominal   Peds  Hematology negative hematology ROS (+)   Anesthesia Other Findings   Reproductive/Obstetrics (+) Pregnancy                           Anesthesia Physical  Anesthesia Plan  ASA: II  Anesthesia Plan: Epidural   Post-op Pain Management:    Induction:   Airway Management Planned:   Additional Equipment:   Intra-op Plan:   Post-operative Plan:   Informed Consent: I have reviewed the patients History and Physical, chart, labs and discussed the procedure including the risks, benefits and alternatives for the proposed anesthesia with the patient or authorized representative who has indicated his/her understanding and acceptance.   Dental Advisory Given  Plan Discussed with: CRNA and Surgeon  Anesthesia Plan Comments:         Anesthesia Quick Evaluation

## 2012-05-04 ENCOUNTER — Encounter (HOSPITAL_COMMUNITY): Payer: Self-pay | Admitting: Obstetrics & Gynecology

## 2012-05-04 LAB — RH IG WORKUP (INCLUDES ABO/RH): Unit division: 0

## 2012-05-04 MED ORDER — DIBUCAINE 1 % RE OINT: 1.0000 "application " | TOPICAL_OINTMENT | RECTAL | Status: DC | PRN

## 2012-05-04 MED ORDER — LANOLIN HYDROUS EX OINT: 1.0000 "application " | TOPICAL_OINTMENT | CUTANEOUS | Status: DC | PRN

## 2012-05-04 MED ORDER — BENZOCAINE-MENTHOL 20-0.5 % EX AERO: 1.0000 "application " | INHALATION_SPRAY | CUTANEOUS | Status: DC | PRN

## 2012-05-04 MED ORDER — WITCH HAZEL-GLYCERIN EX PADS: 1.0000 "application " | MEDICATED_PAD | CUTANEOUS | Status: DC | PRN

## 2012-05-04 MED ORDER — IBUPROFEN 600 MG PO TABS: 600.0000 mg | ORAL_TABLET | Freq: Four times a day (QID) | ORAL | Status: DC

## 2012-05-04 NOTE — Discharge Summary (Signed)
Obstetric Discharge Summary Reason for Admission: rupture of membranes Prenatal Procedures: ultrasound Intrapartum Procedures: spontaneous vaginal delivery Postpartum Procedures: P.P. tubal ligation Complications-Operative and Postpartum: none Hemoglobin  Date Value Range Status  05/03/2012 9.5* 12.0 - 15.0 g/dL Final     HCT  Date Value Range Status  05/03/2012 29.6* 36.0 - 46.0 % Final    Physical Exam:  General: alert and cooperative Lochia: appropriate Uterine Fundus: firm Incision: healing well, no significant drainage, no dehiscence, no significant erythema (BTL) DVT Evaluation: No evidence of DVT seen on physical exam. Negative Homan's sign. No cords or calf tenderness.  Discharge Diagnoses: Term Pregnancy-delivered  Discharge Information: Date: 05/04/2012 Activity: pelvic rest Diet: routine Medications: Ibuprofen Condition: stable Instructions: AVS Discharge to: home Follow-up Information    Schedule an appointment as soon as possible for a visit in 5 weeks to follow up. (make an appt wit your provider in 5-6 weeks)          Newborn Data: Live born female  Birth Weight: 6 lb 4 oz (2835 g) APGAR: 8, 9 Outpatient circ  Home with mother.  Felix Pacini 05/04/2012, 7:50 AM

## 2012-05-04 NOTE — Discharge Summary (Signed)
Attestation of Attending Supervision of Advanced Practitioner (CNM/NP): Evaluation and management procedures were performed by the Advanced Practitioner under my supervision and collaboration.  I have reviewed the Advanced Practitioner's note and chart, and I agree with the management and plan.  HARRAWAY-SMITH, Natasja Niday 8:00 AM     

## 2012-05-04 NOTE — Anesthesia Postprocedure Evaluation (Signed)
  Anesthesia Post-op Note  Patient: Erin Higgins  Procedure(s) Performed: * No procedures listed *  Patient Location: Mother/Baby  Anesthesia Type:Epidural  Level of Consciousness: awake  Airway and Oxygen Therapy: Patient Spontanous Breathing  Post-op Pain: none  Post-op Assessment: Patient's Cardiovascular Status Stable, Respiratory Function Stable, Patent Airway, No signs of Nausea or vomiting, Adequate PO intake, Pain level controlled, No headache, No backache, No residual numbness and No residual motor weakness  Post-op Vital Signs: Reviewed and stable  Complications: No apparent anesthesia complications

## 2012-05-18 DIAGNOSIS — Z302 Encounter for sterilization: Secondary | ICD-10-CM

## 2013-01-28 ENCOUNTER — Encounter (HOSPITAL_COMMUNITY): Payer: Self-pay | Admitting: *Deleted

## 2013-01-28 ENCOUNTER — Emergency Department (HOSPITAL_COMMUNITY)
Admission: EM | Admit: 2013-01-28 | Discharge: 2013-01-28 | Disposition: A | Discharge status: Home / Self Care | Payer: Self-pay | Attending: Emergency Medicine | Admitting: Emergency Medicine

## 2013-01-28 ENCOUNTER — Emergency Department (HOSPITAL_COMMUNITY): Payer: Self-pay

## 2013-01-28 DIAGNOSIS — R12 Heartburn: Secondary | ICD-10-CM | POA: Insufficient documentation

## 2013-01-28 DIAGNOSIS — Z880 Allergy status to penicillin: Secondary | ICD-10-CM | POA: Insufficient documentation

## 2013-01-28 DIAGNOSIS — R109 Unspecified abdominal pain: Secondary | ICD-10-CM | POA: Insufficient documentation

## 2013-01-28 DIAGNOSIS — Z3202 Encounter for pregnancy test, result negative: Secondary | ICD-10-CM | POA: Insufficient documentation

## 2013-01-28 DIAGNOSIS — Z8619 Personal history of other infectious and parasitic diseases: Secondary | ICD-10-CM | POA: Insufficient documentation

## 2013-01-28 DIAGNOSIS — N390 Urinary tract infection, site not specified: Secondary | ICD-10-CM | POA: Insufficient documentation

## 2013-01-28 DIAGNOSIS — Z79899 Other long term (current) drug therapy: Secondary | ICD-10-CM | POA: Insufficient documentation

## 2013-01-28 HISTORY — DX: Heartburn: R12

## 2013-01-28 LAB — COMPREHENSIVE METABOLIC PANEL WITH GFR
ALT: 10 U/L (ref 0–35)
Alkaline Phosphatase: 69 U/L (ref 39–117)
CO2: 26 meq/L (ref 19–32)
Calcium: 9.3 mg/dL (ref 8.4–10.5)
Chloride: 105 meq/L (ref 96–112)
GFR calc Af Amer: >90 mL/min (ref >90)
GFR calc non Af Amer: >90 mL/min (ref >90)
Glucose, Bld: 87 mg/dL (ref 70–99)
Potassium: 3.7 meq/L (ref 3.5–5.1)
Sodium: 140 meq/L (ref 135–145)
Total Bilirubin: 0.7 mg/dL (ref 0.3–1.2)

## 2013-01-28 LAB — CBC WITH DIFFERENTIAL/PLATELET
Basophils Absolute: 0 10*3/uL (ref 0.0–0.1)
Basophils Relative: 0 % (ref 0–1)
Eosinophils Absolute: 0 10*3/uL (ref 0.0–0.7)
Eosinophils Relative: 0 % (ref 0–5)
HCT: 36.5 % (ref 36.0–46.0)
Hemoglobin: 12.1 g/dL (ref 12.0–15.0)
Lymphocytes Relative: 29 % (ref 12–46)
Lymphs Abs: 1.4 K/uL (ref 0.7–4.0)
MCH: 28.7 pg (ref 26.0–34.0)
MCHC: 33.2 g/dL (ref 30.0–36.0)
MCV: 86.5 fL (ref 78.0–100.0)
Monocytes Absolute: 0.5 10*3/uL (ref 0.1–1.0)
Monocytes Relative: 11 % (ref 3–12)
Neutro Abs: 3 K/uL (ref 1.7–7.7)
Neutrophils Relative %: 60 % (ref 43–77)
Platelets: 271 K/uL (ref 150–400)
RBC: 4.22 MIL/uL (ref 3.87–5.11)
RDW: 13.5 % (ref 11.5–15.5)
WBC: 5 K/uL (ref 4.0–10.5)

## 2013-01-28 LAB — COMPREHENSIVE METABOLIC PANEL
AST: 15 U/L (ref 0–37)
Albumin: 3.1 g/dL — ABNORMAL LOW (ref 3.5–5.2)
BUN: 9 mg/dL (ref 6–23)
Creatinine, Ser: 0.67 mg/dL (ref 0.50–1.10)
Total Protein: 7.8 g/dL (ref 6.0–8.3)

## 2013-01-28 LAB — URINALYSIS, ROUTINE W REFLEX MICROSCOPIC
Glucose, UA: NEGATIVE mg/dL
Hgb urine dipstick: NEGATIVE
Ketones, ur: 15 mg/dL — AB
Nitrite: NEGATIVE
Protein, ur: 30 mg/dL — AB
Specific Gravity, Urine: 1.034 — ABNORMAL HIGH (ref 1.005–1.030)
Urobilinogen, UA: 2 mg/dL — ABNORMAL HIGH (ref 0.0–1.0)
pH: 6 (ref 5.0–8.0)

## 2013-01-28 LAB — URINE MICROSCOPIC-ADD ON

## 2013-01-28 LAB — LIPASE, BLOOD: Lipase: 27 U/L (ref 11–59)

## 2013-01-28 LAB — POCT PREGNANCY, URINE: Preg Test, Ur: NEGATIVE

## 2013-01-28 MED ORDER — CEPHALEXIN 500 MG PO CAPS: 500.0000 mg | ORAL_CAPSULE | Freq: Four times a day (QID) | ORAL | Status: DC

## 2013-01-28 MED ORDER — PANTOPRAZOLE SODIUM 20 MG PO TBEC
20.0000 mg | DELAYED_RELEASE_TABLET | Freq: Once | ORAL | Status: AC
  Administered 2013-01-28: 20 mg via ORAL
  Filled 2013-01-28: qty 1

## 2013-01-28 MED ORDER — DOCUSATE SODIUM 100 MG PO CAPS: 100.0000 mg | ORAL_CAPSULE | Freq: Two times a day (BID) | ORAL | Status: DC

## 2013-01-28 NOTE — ED Notes (Signed)
Pt c/o diffuse abd pain x 2 weeks.  Originally started as if it was gas.  It has progressed to where her entire abd is tender to touch.  Pt takes nexium.

## 2013-01-28 NOTE — ED Provider Notes (Signed)
CSN: [id number]     Arrival date & time 01/28/13  1114 History     First MD Initiated Contact with Patient 01/28/13 1130     Chief Complaint  Patient presents with  . Abdominal Pain   (Consider location/radiation/quality/duration/timing/severity/associated sxs/prior Treatment) HPI Comments: 42 year old female the past medical history of heartburn presents to the emergency department complaining of generalized upper abdominal pain over the past 2 weeks. Patient states 2 weeks ago she began her menses, had normal menstrual cycle, but she does have menstrual cramping, however).this can be continued. Describes the pain as "gassy feeling" and cramping, radiating across her upper abdomen. She tried taking Nexium without relief. She is currentlyv her baby. Denies eating any spicy or acidic foods. Mom has a history of gallbladder issues. Recently gave birth. Denies associated nausea, vomiting, fever or chills. States she did have some diarrhea last week, however now her stools are more hard, she is not constipated but states her stool seems more hard.  Patient is a 42 y.o. female presenting with abdominal pain. The history is provided by the patient.  Abdominal Pain Associated symptoms include abdominal pain. Pertinent negatives include no chills, fever, nausea or vomiting.    Past Medical History  Diagnosis Date  . Herpes simplex of female genitalia   . Heartburn    Past Surgical History  Procedure Laterality Date  . Dilation and curettage of uterus    . Tubal ligation  05/03/2012    Procedure: POST PARTUM TUBAL LIGATION;  Surgeon: Willodean Rosenthal, MD;  Location: [facility];  Service: Gynecology;  Laterality: Bilateral;  with filshie clips   No family history on file. History  Substance Use Topics  . Smoking status: Never Smoker   . Smokeless tobacco: Not on file  . Alcohol Use: No   OB History   Grav Para Term Preterm Abortions TAB SAB Ect Mult Living   3 2 2  1  1   2      Review  of Systems  Constitutional: Negative for fever and chills.  Gastrointestinal: Positive for abdominal pain. Negative for nausea and vomiting.  Genitourinary: Negative for dysuria, urgency, frequency, vaginal bleeding, vaginal discharge, difficulty urinating, vaginal pain and menstrual problem.  All other systems reviewed and are negative.    Allergies  Penicillins  Home Medications   Current Outpatient Rx  Name  Route  Sig  Dispense  Refill  . benzocaine-Menthol (DERMOPLAST) 20-0.5 % AERO   Topical   Apply 1 application topically as needed (perineal discomfort).         . dibucaine (NUPERCAINAL) 1 % OINT   Rectal   Place 1 application rectally as needed.         Marland Kitchen ibuprofen (ADVIL,MOTRIN) 600 MG tablet   Oral   Take 1 tablet (600 mg total) by mouth every 6 (six) hours.   60 tablet   0   . lanolin OINT   Topical   Apply 1 application topically as needed (for breast care).         Marland Kitchen omeprazole (PRILOSEC) 10 MG capsule   Oral   Take 10 mg by mouth daily.         . valACYclovir (VALTREX) 500 MG tablet   Oral   Take 500 mg by mouth 2 (two) times daily.         Marland Kitchen witch hazel-glycerin (TUCKS) pad   Topical   Apply 1 application topically as needed.   40 each  BP 129/77  Temp(Src) 98.2 F (36.8 C) (Oral)  Resp 20  SpO2 97%  LMP 01/14/2013  Breastfeeding? Yes Physical Exam  Nursing note and vitals reviewed. Constitutional: She is oriented to person, place, and time. She appears well-developed and well-nourished. No distress.  HENT:  Head: Normocephalic and atraumatic.  Mouth/Throat: Oropharynx is clear and moist.  Eyes: Conjunctivae are normal.  Neck: Normal range of motion. Neck supple.  Cardiovascular: Normal rate, regular rhythm and normal heart sounds.   Pulmonary/Chest: Effort normal and breath sounds normal.  Abdominal: Soft. Normal appearance and bowel sounds are normal. She exhibits no distension and no mass. There is tenderness. There is  no rigidity, no rebound, no guarding, no tenderness at McBurney's point and negative Murphy's sign.    No peritoneal signs.  Musculoskeletal: Normal range of motion. She exhibits no edema.  Neurological: She is alert and oriented to person, place, and time.  Skin: Skin is warm and dry. She is not diaphoretic.  Psychiatric: She has a normal mood and affect. Her behavior is normal.    ED Course   Procedures (including critical care time)  Labs Reviewed  COMPREHENSIVE METABOLIC PANEL - Abnormal; Notable for the following:    Albumin 3.1 (*)    All other components within normal limits  URINALYSIS, ROUTINE W REFLEX MICROSCOPIC - Abnormal; Notable for the following:    Color, Urine AMBER (*)    APPearance CLOUDY (*)    Specific Gravity, Urine 1.034 (*)    Bilirubin Urine SMALL (*)    Ketones, ur 15 (*)    Protein, ur 30 (*)    Urobilinogen, UA 2.0 (*)    Leukocytes, UA MODERATE (*)    All other components within normal limits  URINE MICROSCOPIC-ADD ON - Abnormal; Notable for the following:    Squamous Epithelial / LPF MANY (*)    Bacteria, UA MANY (*)    All other components within normal limits  URINE CULTURE  CBC WITH DIFFERENTIAL  LIPASE, BLOOD  POCT PREGNANCY, URINE   US Abdomen Complete  01/28/2013   *RADIOLOGY REPORT*  Abdominal ultrasound  History:  Abdominal pain  Comparison:  CT abdomen October 23, 2005  Findings:  Gallbladder is visualized in multiple projections. There are no gallstones, gallbladder wall thickening, pericholecystic fluid collection.  There is no intrahepatic, common hepatic, common bile duct dilatation.  Pancreas is normal in sonographic appearance.  No focal liver lesions are identified. Spleen is normal in size and homogeneous in echotexture.  Kidneys bilaterally appear normal.  There is no ascites.  Aorta is nonaneurysmal.  Inferior vena cava appears normal.  Conclusion:  Study within normal limits.   Original Report Authenticated By: Bretta Bang,  M.D.   1. Abdominal pain   2. UTI (urinary tract infection)     MDM  Patient with upper abdominal pain, moreso on R. Recently gave birth. She is in NAD, normal vital signs. Negative Murphy's. She is at increased risk of gallbladder problems, will obtain abdominal US. Labs pending- cbc, cmp, lipase, ua. 1:29 PM Abdominal US unremarkable. Labs normal, urine with slight infection. Will treat with 5 days of keflex. For her abdominal pain, advised ibuprofen, increased fiber and stool softeners. Abdomen soft, some improvement in tenderness, no peritoneal signs. She is stable for discharge. Return precautions discussed. Patient states understanding of plan and is agreeable.  Allergy noted to keflex. Will treat with macrobid as she is breastfeeding.  Trevor Mace, PA-C 01/28/13 1331  Trevor Mace, PA-C 01/28/13  1459 

## 2013-01-30 LAB — URINE CULTURE

## 2013-01-30 NOTE — ED Provider Notes (Signed)
Medical screening examination/treatment/procedure(s) were performed by non-physician practitioner and as supervising physician I was immediately available for consultation/collaboration.  Candyce Churn, MD 01/30/13 [phone]

## 2014-04-28 ENCOUNTER — Encounter (HOSPITAL_COMMUNITY): Payer: Self-pay | Admitting: *Deleted

## 2014-07-14 IMAGING — US US ABDOMEN COMPLETE
1 series · 14 of 25 positions shown · non-contrast
Comparison: CT abdomen October 23, 2005

Abdominal ultrasound
HISTORY: Abdominal pain

[Series 1: us abdomen complete · 0.20mm/px · 14 of 77 slices shown]
[im 1/77]
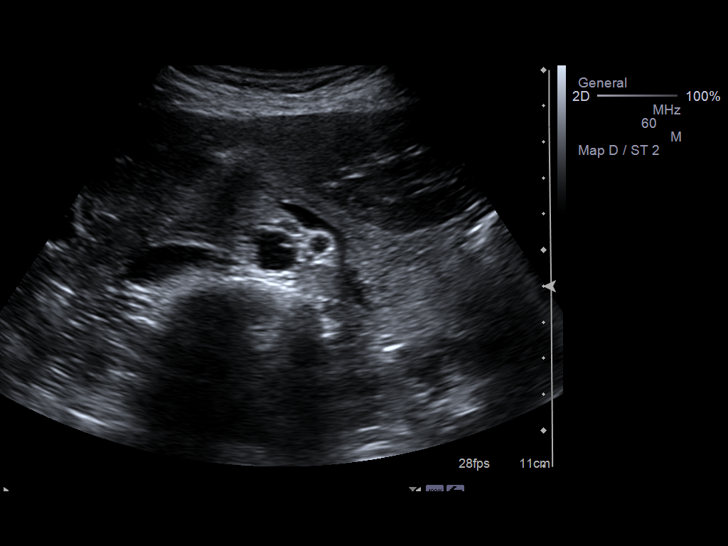
[im 7/77]
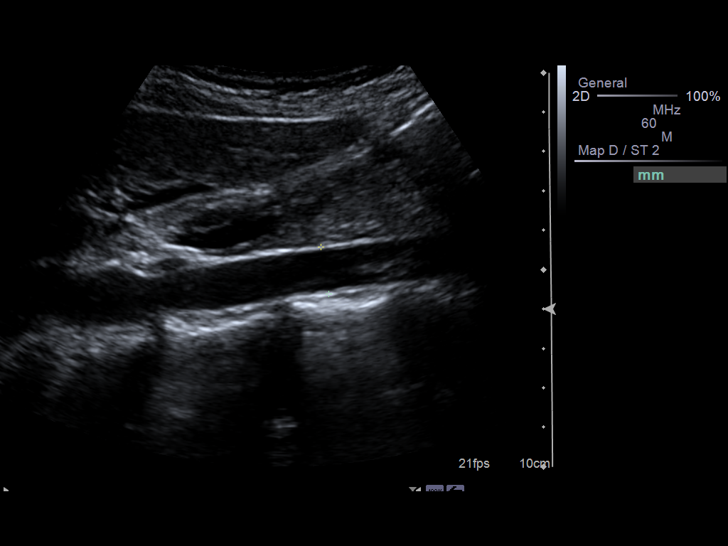
[im 13/77]
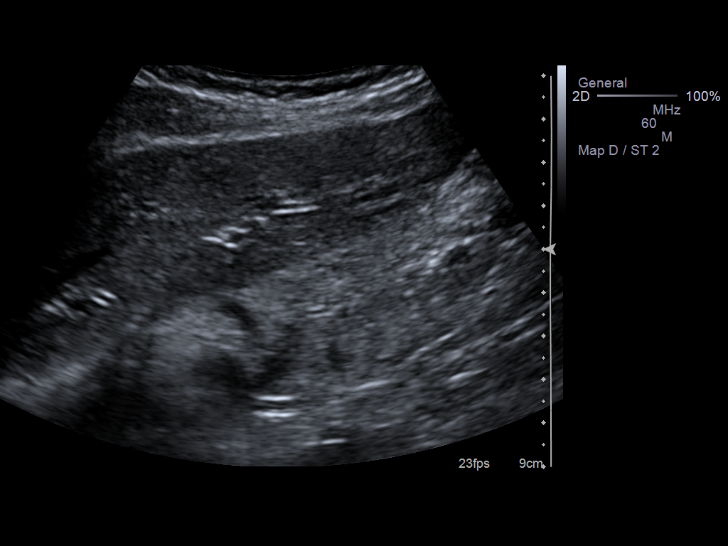
[im 20/77]
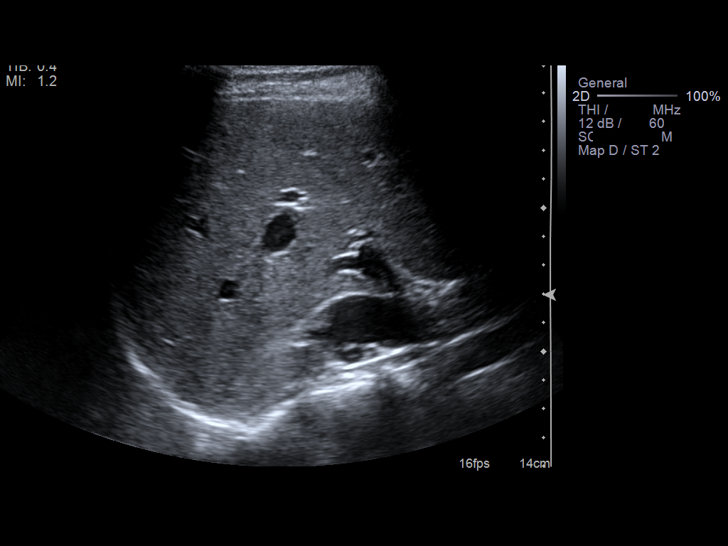
[im 26/77]
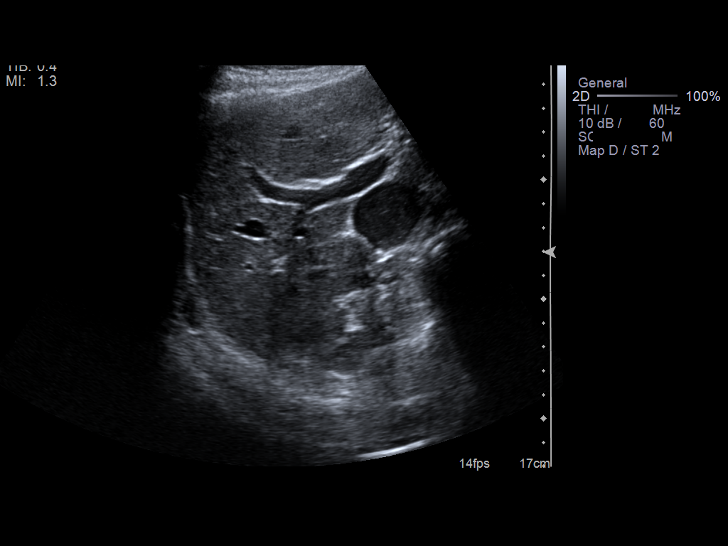
[im 29/77]
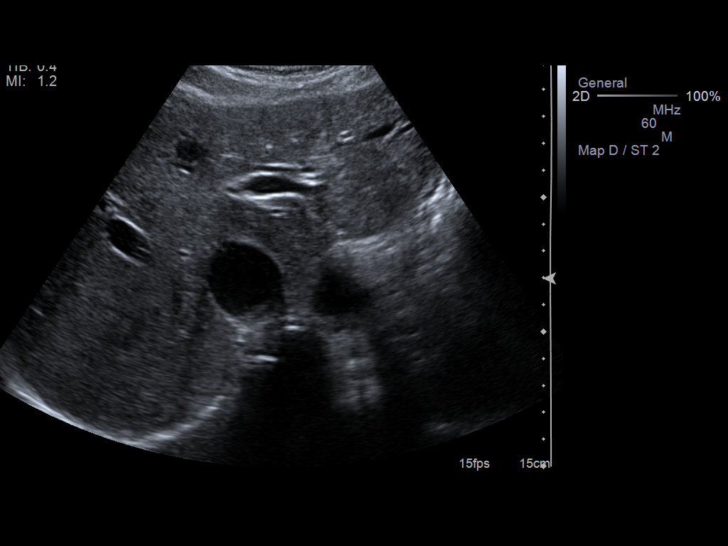
[im 35/77]
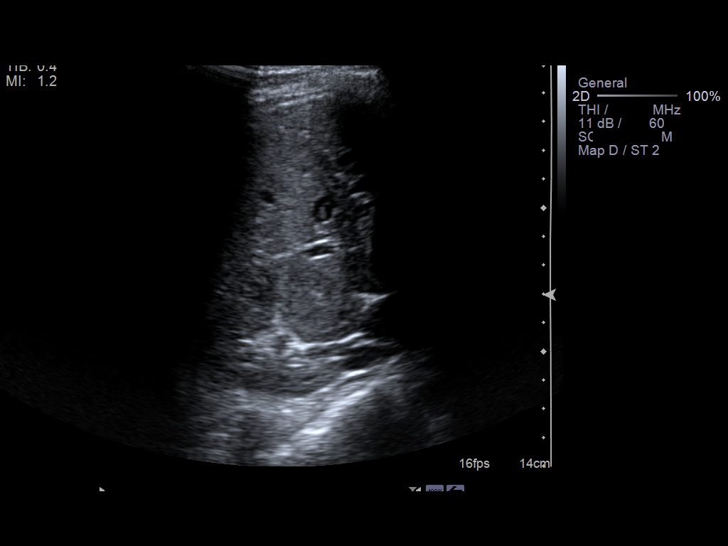
[im 42/77]
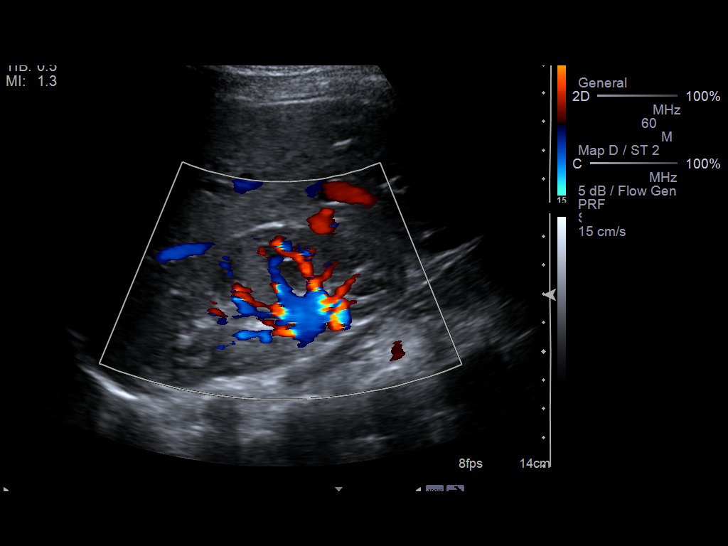
[im 48/77]
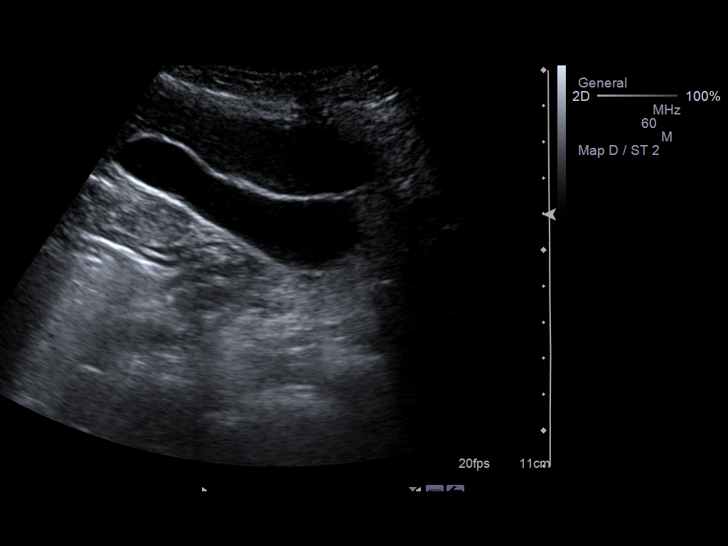
[im 51/77]
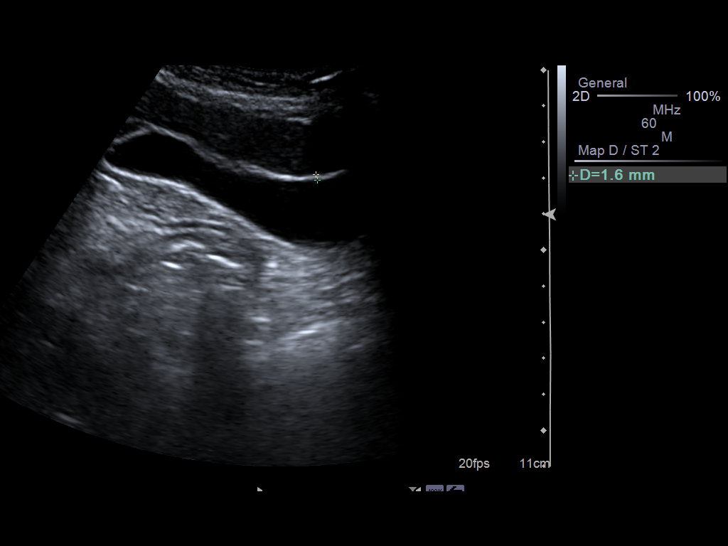
[im 58/77]
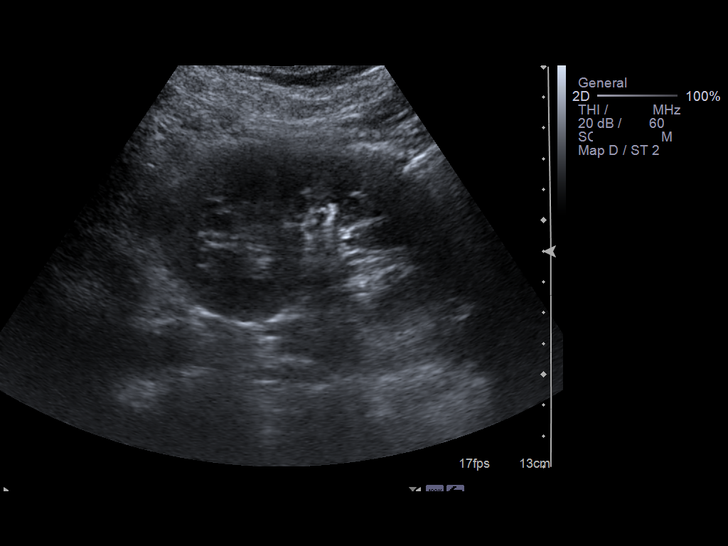
[im 64/77]
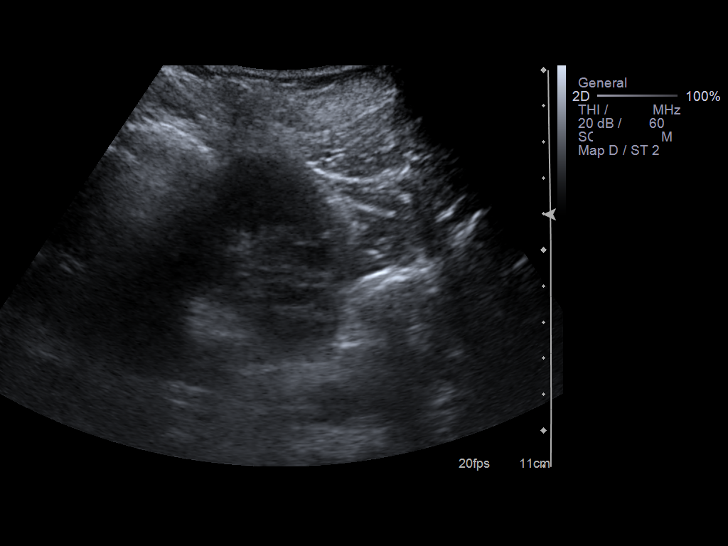
[im 70/77]
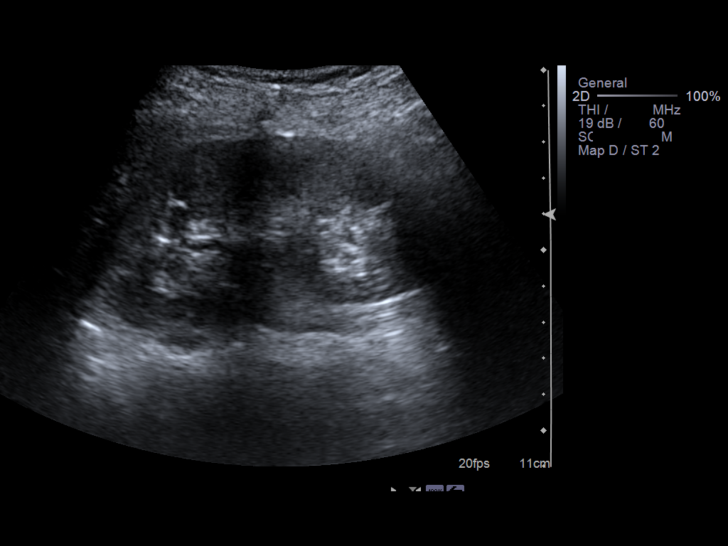
[im 77/77]
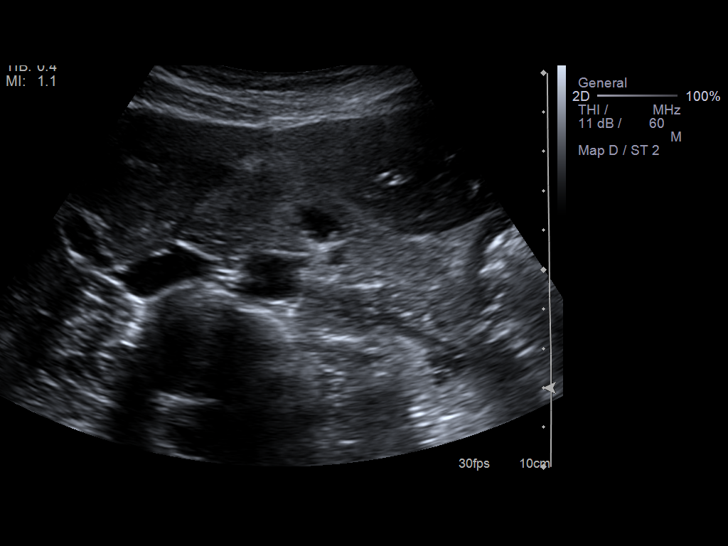

[14 of 25 positions shown; findings below may reference images not displayed]

FINDINGS: Gallbladder is visualized in multiple projections.
There are no gallstones, gallbladder wall thickening,
pericholecystic fluid collection.  There is no intrahepatic, common
hepatic, common bile duct dilatation.  Pancreas is normal in
sonographic appearance.  No focal liver lesions are identified.
Spleen is normal in size and homogeneous in echotexture.  Kidneys
bilaterally appear normal.  There is no ascites.  Aorta is
nonaneurysmal.  Inferior vena cava appears normal.
CONCLUSION: Study within normal limits.

## 2016-07-11 ENCOUNTER — Emergency Department (HOSPITAL_COMMUNITY)
Admission: EM | Admit: 2016-07-11 | Discharge: 2016-07-12 | Disposition: A | Discharge status: Home / Self Care | Payer: Self-pay | Attending: Emergency Medicine | Admitting: Emergency Medicine

## 2016-07-11 ENCOUNTER — Emergency Department (HOSPITAL_COMMUNITY): Payer: Self-pay

## 2016-07-11 ENCOUNTER — Encounter (HOSPITAL_COMMUNITY): Payer: Self-pay | Admitting: Emergency Medicine

## 2016-07-11 DIAGNOSIS — R079 Chest pain, unspecified: Secondary | ICD-10-CM | POA: Insufficient documentation

## 2016-07-11 DIAGNOSIS — R197 Diarrhea, unspecified: Secondary | ICD-10-CM | POA: Insufficient documentation

## 2016-07-11 DIAGNOSIS — Z79899 Other long term (current) drug therapy: Secondary | ICD-10-CM | POA: Insufficient documentation

## 2016-07-11 DIAGNOSIS — R1032 Left lower quadrant pain: Secondary | ICD-10-CM | POA: Insufficient documentation

## 2016-07-11 DIAGNOSIS — R109 Unspecified abdominal pain: Secondary | ICD-10-CM

## 2016-07-11 DIAGNOSIS — R112 Nausea with vomiting, unspecified: Secondary | ICD-10-CM

## 2016-07-11 DIAGNOSIS — R0981 Nasal congestion: Secondary | ICD-10-CM | POA: Insufficient documentation

## 2016-07-11 LAB — URINALYSIS, ROUTINE W REFLEX MICROSCOPIC
BILIRUBIN URINE: NEGATIVE
Glucose, UA: NEGATIVE mg/dL
Hgb urine dipstick: NEGATIVE
KETONES UR: NEGATIVE mg/dL
Leukocytes, UA: NEGATIVE
NITRITE: NEGATIVE
Protein, ur: NEGATIVE mg/dL
Specific Gravity, Urine: 1.003 — ABNORMAL LOW (ref 1.005–1.030)
pH: 7 (ref 5.0–8.0)

## 2016-07-11 LAB — COMPREHENSIVE METABOLIC PANEL
ALBUMIN: 3.4 g/dL — AB (ref 3.5–5.0)
ALK PHOS: 37 U/L — AB (ref 38–126)
ALT: 13 U/L — ABNORMAL LOW (ref 14–54)
AST: 19 U/L (ref 15–41)
Anion gap: 4 — ABNORMAL LOW (ref 5–15)
BILIRUBIN TOTAL: 0.3 mg/dL (ref 0.3–1.2)
BUN: 7 mg/dL (ref 6–20)
CALCIUM: 9.1 mg/dL (ref 8.9–10.3)
CO2: 28 mmol/L (ref 22–32)
CREATININE: 0.8 mg/dL (ref 0.44–1.00)
Chloride: 105 mmol/L (ref 101–111)
GFR calc Af Amer: >60 mL/min (ref >60)
GFR calc non Af Amer: >60 mL/min (ref >60)
GLUCOSE: 88 mg/dL (ref 65–99)
Potassium: 3.8 mmol/L (ref 3.5–5.1)
Sodium: 137 mmol/L (ref 135–145)
TOTAL PROTEIN: 6.8 g/dL (ref 6.5–8.1)

## 2016-07-11 LAB — CBC
HCT: 31.1 % — ABNORMAL LOW (ref 36.0–46.0)
Hemoglobin: 10.2 g/dL — ABNORMAL LOW (ref 12.0–15.0)
MCH: 27.1 pg (ref 26.0–34.0)
MCHC: 32.8 g/dL (ref 30.0–36.0)
MCV: 82.7 fL (ref 78.0–100.0)
Platelets: 261 10*3/uL (ref 150–400)
RBC: 3.76 MIL/uL — ABNORMAL LOW (ref 3.87–5.11)
RDW: 14 % (ref 11.5–15.5)
WBC: 4.6 10*3/uL (ref 4.0–10.5)

## 2016-07-11 LAB — PREGNANCY, URINE: Preg Test, Ur: NEGATIVE

## 2016-07-11 LAB — LIPASE, BLOOD: Lipase: 22 U/L (ref 11–51)

## 2016-07-11 MED ORDER — IOPAMIDOL (ISOVUE-300) INJECTION 61%
100.0000 mL | Freq: Once | INTRAVENOUS | Status: AC | PRN
  Administered 2016-07-11: 100 mL via INTRAVENOUS

## 2016-07-11 MED ORDER — MORPHINE SULFATE (PF) 4 MG/ML IV SOLN
4.0000 mg | Freq: Once | INTRAVENOUS | Status: AC
  Administered 2016-07-11: 4 mg via INTRAVENOUS
  Filled 2016-07-11: qty 1

## 2016-07-11 MED ORDER — FENTANYL CITRATE (PF) 100 MCG/2ML IJ SOLN
50.0000 ug | Freq: Once | INTRAMUSCULAR | Status: AC
Start: 2016-07-11 — End: 2016-07-11
  Administered 2016-07-11: 50 ug via INTRAVENOUS
  Filled 2016-07-11: qty 2

## 2016-07-11 MED ORDER — SODIUM CHLORIDE 0.9 % IV BOLUS (SEPSIS)
1000.0000 mL | Freq: Once | INTRAVENOUS | Status: AC
  Administered 2016-07-11: 1000 mL via INTRAVENOUS

## 2016-07-11 MED ORDER — IOPAMIDOL (ISOVUE-300) INJECTION 61%
INTRAVENOUS | Status: AC
  Administered 2016-07-11: 30 mL
  Filled 2016-07-11: qty 30

## 2016-07-11 MED ORDER — ONDANSETRON HCL 4 MG/2ML IJ SOLN
4.0000 mg | Freq: Once | INTRAMUSCULAR | Status: AC
  Administered 2016-07-11: 4 mg via INTRAVENOUS
  Filled 2016-07-11: qty 2

## 2016-07-11 NOTE — ED Provider Notes (Signed)
[facility] Provider Note   CSN: [id number] Arrival date & time: 07/11/16  1804     History   Chief Complaint Chief Complaint  Patient presents with  . Abdominal Pain    HPI Erin Higgins is a 46 y.o. female.  HPI  Patient presents with nausea vomiting diarrhea and abdominal pain. Has had it for around the last 5 days. States started with the nausea vomiting diarrhea then quickly had increased pain. States the pain felt like 1000 needles stabbing her on the inside. Also had some pain felt like her menses. States his initiated anything she would have diarrhea.  Eating increase the pain. She had some chills. Continued pains. Pain is worse in her lower left abdomen But she states that the pain is everywhere on her abdomen.. She has a son that it had diarrhea also. No recent antibiotics. No recent travel. No vaginal bl   Past Medical History:  Diagnosis Date  . Heartburn   . Herpes simplex of female genitalia     Patient Active Problem List   Diagnosis Date Noted  . Sterilization 05/03/2012    Past Surgical History:  Procedure Laterality Date  . DILATION AND CURETTAGE OF UTERUS    . TUBAL LIGATION  05/03/2012   Procedure: POST PARTUM TUBAL LIGATION;  Surgeon: Willodean Rosenthal, MD;  Location: [facility];  Service: Gynecology;  Laterality: Bilateral;  with filshie clips    OB History    Gravida Para Term Preterm AB Living   3 2 2   1 2    SAB TAB Ectopic Multiple Live Births   1       1       Home Medications    Prior to Admission medications   Medication Sig Start Date End Date Taking? Authorizing Provider  Phenyleph-CPM-DM-Aspirin (ALKA-SELTZER PLUS COLD & COUGH) 7.01-26-09-325 MG TBEF Take 1 tablet by mouth once as needed (for symptoms).   Yes Historical Provider, MD    Family History History reviewed. No pertinent family history.  Social History Social History  Substance Use Topics  . Smoking status: Never Smoker  . Smokeless tobacco: Never Used    . Alcohol use Yes     Comment: occasionally     Allergies   Penicillins   Review of Systems Review of Systems  Constitutional: Positive for appetite change.  HENT: Positive for congestion.   Respiratory: Negative for shortness of breath.   Cardiovascular: Positive for chest pain.  Gastrointestinal: Positive for abdominal pain, diarrhea, nausea and vomiting.  Genitourinary: Negative for dysuria.  Musculoskeletal: Negative for back pain.  Skin: Negative for wound.  Neurological: Negative for numbness.  Hematological: Negative for adenopathy.  Psychiatric/Behavioral: Negative for confusion.     Physical Exam Updated Vital Signs BP 100/88   Pulse (!) 56   Temp 98.1 F (36.7 C) (Oral)   Resp 18   Ht 5\' 5"  (1.651 m)   Wt 120 lb (54.4 kg)   LMP 07/04/2016   SpO2 100%   BMI 19.97 kg/m   Physical Exam  Constitutional: She appears well-developed.  HENT:  Head: Normocephalic.  Eyes: Pupils are equal, round, and reactive to light.  Neck: Neck supple.  Cardiovascular: Normal rate.   Pulmonary/Chest: Effort normal.  Abdominal: There is tenderness.  Mild diffuse tenderness, worse in the left lower quadrant.  Musculoskeletal: She exhibits no edema.  Neurological: She is alert.  Skin: Skin is warm. Capillary refill takes less than 2 seconds.     ED Treatments /  Results  Labs (all labs ordered are listed, but only abnormal results are displayed) Labs Reviewed  COMPREHENSIVE METABOLIC PANEL - Abnormal; Notable for the following:       Result Value   Albumin 3.4 (*)    ALT 13 (*)    Alkaline Phosphatase 37 (*)    Anion gap 4 (*)    All other components within normal limits  CBC - Abnormal; Notable for the following:    RBC 3.76 (*)    Hemoglobin 10.2 (*)    HCT 31.1 (*)    All other components within normal limits  URINALYSIS, ROUTINE W REFLEX MICROSCOPIC - Abnormal; Notable for the following:    Color, Urine COLORLESS (*)    Specific Gravity, Urine 1.003 (*)     All other components within normal limits  LIPASE, BLOOD    EKG  EKG Interpretation None       Radiology No results found.  Procedures Procedures (including critical care time)  Medications Ordered in ED Medications  sodium chloride 0.9 % bolus 1,000 mL (1,000 mLs Intravenous New Bag/Given 07/11/16 2110)  iopamidol (ISOVUE-300) 61 % injection (not administered)  sodium chloride 0.9 % bolus 1,000 mL (0 mLs Intravenous Stopped 07/11/16 2035)  ondansetron (ZOFRAN) injection 4 mg (4 mg Intravenous Given 07/11/16 1935)  fentaNYL (SUBLIMAZE) injection 50 mcg (50 mcg Intravenous Given 07/11/16 2110)     Initial Impression / Assessment and Plan / ED Course  I have reviewed the triage vital signs and the nursing notes.  Pertinent labs & imaging results that were available during my care of the patient were reviewed by me and considered in my medical decision making (see chart for details).  Clinical Course     Patient with nausea vomiting diarrhea. Also abdominal pain. Lab work reassuring but continues to have pain. With the amount pain will get CT scan. Care turned over to Dr. Juleen ChinaKohut.  Final Clinical Impressions(s) / ED Diagnoses   Final diagnoses:  Nausea vomiting and diarrhea  Abdominal pain, unspecified abdominal location    New Prescriptions New Prescriptions   No medications on file     Benjiman CoreNathan Loden Laurent, MD 07/11/16 2116

## 2016-07-11 NOTE — ED Notes (Signed)
Patient requesting something for pain.  States pain increased after drinking po contrast. New verbal orders given by Dr. Juleen ChinaKohut.

## 2016-07-11 NOTE — ED Triage Notes (Signed)
Patient complaining of abdominal pain x 5 days. Also complaining of nausea.

## 2016-07-12 MED ORDER — OXYCODONE-ACETAMINOPHEN 5-325 MG PO TABS
ORAL_TABLET | ORAL | Status: AC
  Filled 2016-07-12: qty 2

## 2016-07-12 MED ORDER — TRAMADOL HCL 50 MG PO TABS: 50.0000 mg | ORAL_TABLET | Freq: Four times a day (QID) | ORAL | Status: DC | PRN

## 2016-07-12 MED ORDER — ONDANSETRON HCL 4 MG PO TABS: 4.0000 mg | ORAL_TABLET | Freq: Four times a day (QID) | ORAL | 0 refills | Status: DC

## 2016-07-12 MED ORDER — TRAMADOL HCL 50 MG PO TABS: 50.0000 mg | ORAL_TABLET | Freq: Four times a day (QID) | ORAL | 0 refills | Status: DC | PRN

## 2016-07-12 MED ORDER — OXYCODONE-ACETAMINOPHEN 5-325 MG PO TABS
2.0000 | ORAL_TABLET | Freq: Once | ORAL | Status: AC
  Administered 2016-07-12: 2 via ORAL

## 2016-07-12 NOTE — ED Notes (Signed)
In to discharge pt, pt states she "feels worse and my pain is worse than when I got here". Kohut notified. Med orders received and given, see EMAR. Dr. Juleen ChinaKohut in room speaking with patient.

## 2017-03-28 ENCOUNTER — Emergency Department (HOSPITAL_COMMUNITY)
Admission: EM | Admit: 2017-03-28 | Discharge: 2017-03-28 | Disposition: A | Discharge status: Home Health | Payer: Self-pay | Attending: Emergency Medicine | Admitting: Emergency Medicine

## 2017-03-28 ENCOUNTER — Encounter (HOSPITAL_COMMUNITY): Payer: Self-pay

## 2017-03-28 ENCOUNTER — Emergency Department (HOSPITAL_COMMUNITY): Payer: Self-pay

## 2017-03-28 DIAGNOSIS — F419 Anxiety disorder, unspecified: Secondary | ICD-10-CM | POA: Insufficient documentation

## 2017-03-28 DIAGNOSIS — Z79899 Other long term (current) drug therapy: Secondary | ICD-10-CM | POA: Insufficient documentation

## 2017-03-28 DIAGNOSIS — R0789 Other chest pain: Secondary | ICD-10-CM | POA: Insufficient documentation

## 2017-03-28 LAB — CBC WITH DIFFERENTIAL/PLATELET
Basophils Absolute: 0 10*3/uL (ref 0.0–0.1)
Basophils Relative: 0 %
EOS ABS: 0 10*3/uL (ref 0.0–0.7)
Eosinophils Relative: 0 %
HEMATOCRIT: 36.2 % (ref 36.0–46.0)
HEMOGLOBIN: 11.5 g/dL — AB (ref 12.0–15.0)
LYMPHS PCT: 51 %
Lymphs Abs: 2.3 10*3/uL (ref 0.7–4.0)
MCH: 26.3 pg (ref 26.0–34.0)
MCHC: 31.8 g/dL (ref 30.0–36.0)
MCV: 82.8 fL (ref 78.0–100.0)
MONOS PCT: 7 %
Monocytes Absolute: 0.3 10*3/uL (ref 0.1–1.0)
NEUTROS ABS: 1.9 10*3/uL (ref 1.7–7.7)
NEUTROS PCT: 42 %
Platelets: 258 10*3/uL (ref 150–400)
RBC: 4.37 MIL/uL (ref 3.87–5.11)
RDW: 14.4 % (ref 11.5–15.5)
WBC: 4.5 10*3/uL (ref 4.0–10.5)

## 2017-03-28 LAB — BASIC METABOLIC PANEL
Anion gap: 7 (ref 5–15)
BUN: 10 mg/dL (ref 6–20)
CHLORIDE: 106 mmol/L (ref 101–111)
CO2: 24 mmol/L (ref 22–32)
CREATININE: 0.81 mg/dL (ref 0.44–1.00)
Calcium: 9.1 mg/dL (ref 8.9–10.3)
GFR calc Af Amer: >60 mL/min (ref >60)
GFR calc non Af Amer: >60 mL/min (ref >60)
GLUCOSE: 95 mg/dL (ref 65–99)
POTASSIUM: 3.6 mmol/L (ref 3.5–5.1)
Sodium: 137 mmol/L (ref 135–145)

## 2017-03-28 LAB — TROPONIN I

## 2017-03-28 LAB — D-DIMER, QUANTITATIVE: D-Dimer, Quant: <0.27 ug/mL-FEU (ref 0.00–0.50)

## 2017-03-28 MED ORDER — GI COCKTAIL ~~LOC~~
30.0000 mL | Freq: Once | ORAL | Status: AC
  Administered 2017-03-28: 30 mL via ORAL
  Filled 2017-03-28: qty 30

## 2017-03-28 MED ORDER — HYDROXYZINE HCL 25 MG PO TABS
25.0000 mg | ORAL_TABLET | Freq: Once | ORAL | Status: AC
  Administered 2017-03-28: 25 mg via ORAL
  Filled 2017-03-28: qty 1

## 2017-03-28 MED ORDER — IBUPROFEN 600 MG PO TABS: 600.0000 mg | ORAL_TABLET | Freq: Four times a day (QID) | ORAL | 0 refills | Status: DC | PRN

## 2017-03-28 MED ORDER — HYDROXYZINE HCL 25 MG PO TABS: 25.0000 mg | ORAL_TABLET | Freq: Four times a day (QID) | ORAL | 0 refills | Status: DC | PRN

## 2017-03-28 NOTE — Discharge Instructions (Signed)
You have been evaluated for your chest discomfort.  No evidence to suggest that you are having a heart attack or having blood clot in your lungs.  Take vistaril as needed as this may be an anxiety attack.  Follow up with a primary care provider for further care.  Return if your condition worsen or if you have other concerns.

## 2017-03-28 NOTE — ED Triage Notes (Signed)
Pt reports has been pulling up carpet for the past 3 days and woke up at 5 am feeling like someone is standing on her chest.  Reports pain is worse with movement and radiates down left arm.  Reports sob.  Pt tearful.  Pt also says is under a lot of stress.
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
## 2018-09-11 IMAGING — DX DG CHEST 2V
2 series · 2 of 2 positions shown · non-contrast
Comparison: None.

CLINICAL DATA: Chest pain

EXAM:
CHEST  2 VIEW

[chest pa]
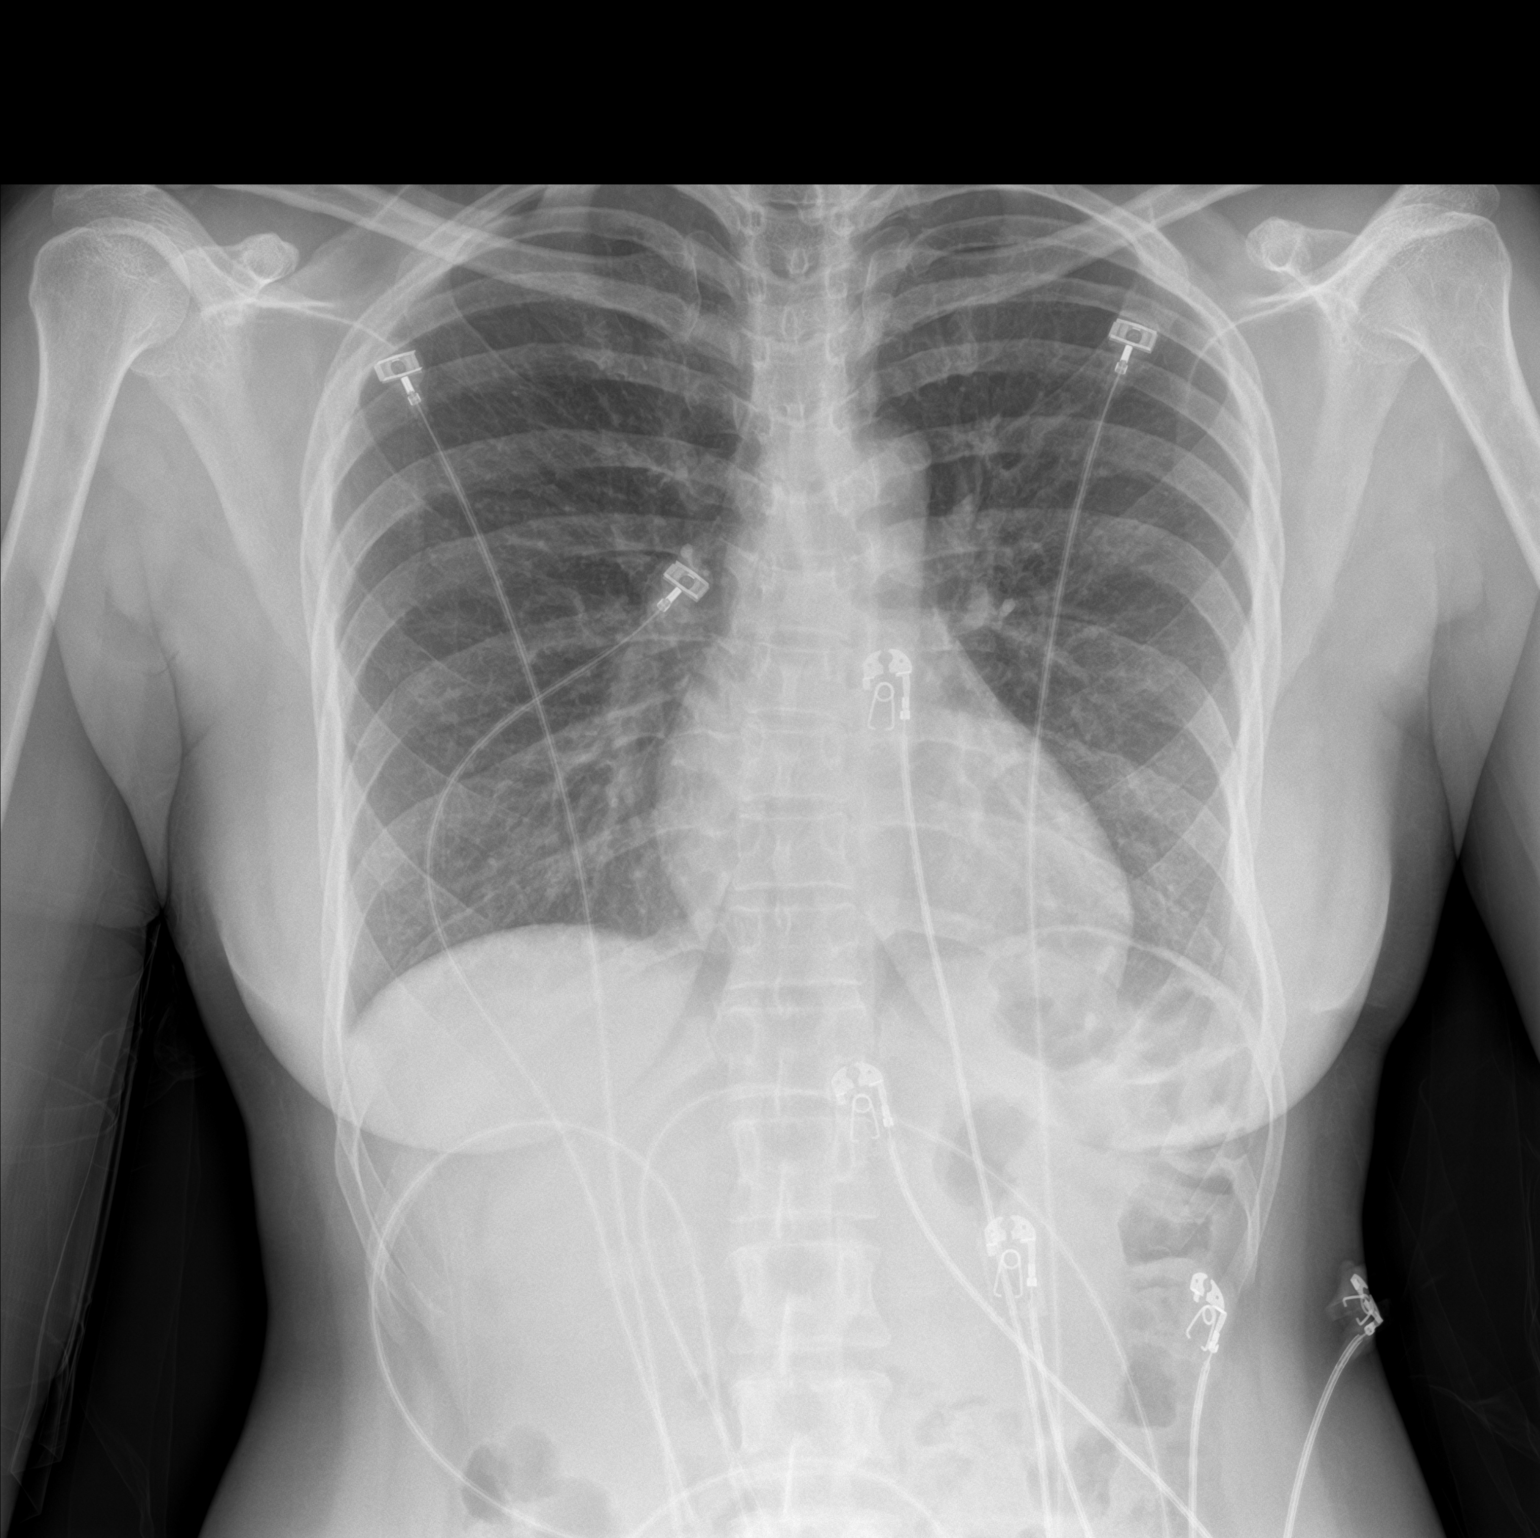

[chest lat]
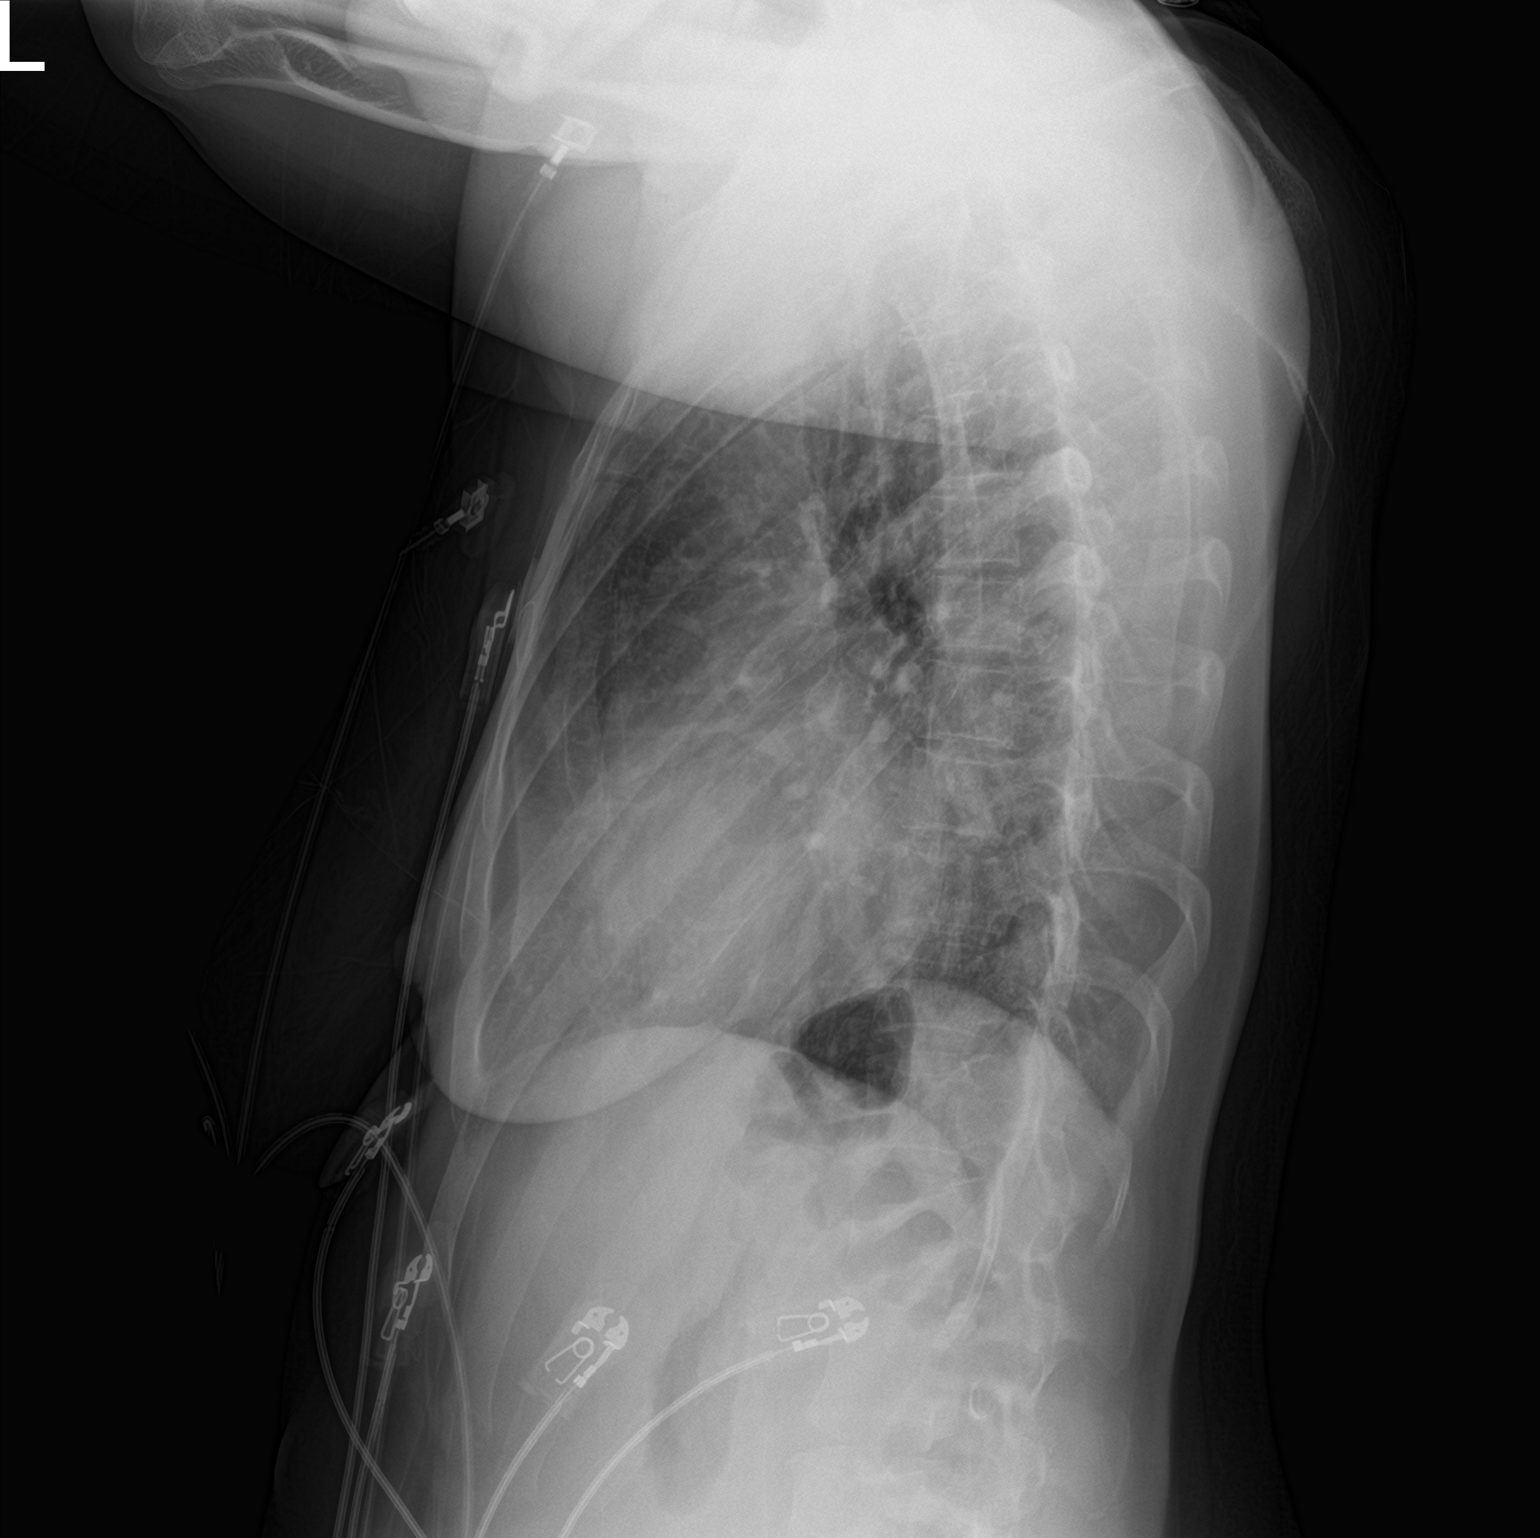

[2 of 2 positions shown; findings below may reference images not displayed]

FINDINGS: Lungs are clear. Heart size and pulmonary vascularity are normal. No
adenopathy. No pneumothorax. No bone lesions.
IMPRESSION: No edema or consolidation.

## 2021-11-25 ENCOUNTER — Encounter (HOSPITAL_COMMUNITY): Payer: Self-pay | Admitting: Emergency Medicine

## 2021-11-25 ENCOUNTER — Emergency Department (HOSPITAL_COMMUNITY)
Admission: EM | Admit: 2021-11-25 | Discharge: 2021-11-26 | Disposition: A | Discharge status: Home / Self Care | Payer: Self-pay | Attending: Emergency Medicine | Admitting: Emergency Medicine

## 2021-11-25 ENCOUNTER — Other Ambulatory Visit: Payer: Self-pay

## 2021-11-25 DIAGNOSIS — Z7982 Long term (current) use of aspirin: Secondary | ICD-10-CM | POA: Insufficient documentation

## 2021-11-25 DIAGNOSIS — D649 Anemia, unspecified: Secondary | ICD-10-CM | POA: Insufficient documentation

## 2021-11-25 DIAGNOSIS — R609 Edema, unspecified: Secondary | ICD-10-CM

## 2021-11-25 DIAGNOSIS — R600 Localized edema: Secondary | ICD-10-CM | POA: Insufficient documentation

## 2021-11-25 NOTE — ED Notes (Signed)
Pt not seen in room  

## 2021-11-25 NOTE — ED Triage Notes (Signed)
Pt c/o intermittent swelling noted to feet/hands and under eyes with dark circles x 5 months. Swelling noted to top of feet at this time. Mild swelling noted under eyes. Denies pain/sob

## 2021-11-26 LAB — URINALYSIS, ROUTINE W REFLEX MICROSCOPIC
Bilirubin Urine: NEGATIVE
Glucose, UA: NEGATIVE mg/dL
Hgb urine dipstick: NEGATIVE
Ketones, ur: NEGATIVE mg/dL
Leukocytes,Ua: NEGATIVE
Nitrite: NEGATIVE
Protein, ur: NEGATIVE mg/dL
Specific Gravity, Urine: 1.013 (ref 1.005–1.030)
pH: 5 (ref 5.0–8.0)

## 2021-11-26 LAB — COMPREHENSIVE METABOLIC PANEL
ALT: 16 U/L (ref 0–44)
AST: 19 U/L (ref 15–41)
Albumin: 3.6 g/dL (ref 3.5–5.0)
Alkaline Phosphatase: 45 U/L (ref 38–126)
Anion gap: 5 (ref 5–15)
BUN: 12 mg/dL (ref 6–20)
CO2: 25 mmol/L (ref 22–32)
Calcium: 8.8 mg/dL — ABNORMAL LOW (ref 8.9–10.3)
Chloride: 108 mmol/L (ref 98–111)
Creatinine, Ser: 0.66 mg/dL (ref 0.44–1.00)
GFR, Estimated: >60 mL/min (ref >60)
Glucose, Bld: 99 mg/dL (ref 70–99)
Potassium: 4.3 mmol/L (ref 3.5–5.1)
Sodium: 138 mmol/L (ref 135–145)
Total Bilirubin: 0.6 mg/dL (ref 0.3–1.2)
Total Protein: 6.9 g/dL (ref 6.5–8.1)

## 2021-11-26 LAB — CBC WITH DIFFERENTIAL/PLATELET
Abs Immature Granulocytes: 0.01 10*3/uL (ref 0.00–0.07)
Basophils Absolute: 0 10*3/uL (ref 0.0–0.1)
Basophils Relative: 0 %
Eosinophils Absolute: 0.1 10*3/uL (ref 0.0–0.5)
Eosinophils Relative: 1 %
HCT: 37.1 % (ref 36.0–46.0)
Hemoglobin: 11.8 g/dL — ABNORMAL LOW (ref 12.0–15.0)
Immature Granulocytes: 0 %
Lymphocytes Relative: 37 %
Lymphs Abs: 2.6 10*3/uL (ref 0.7–4.0)
MCH: 28 pg (ref 26.0–34.0)
MCHC: 31.8 g/dL (ref 30.0–36.0)
MCV: 87.9 fL (ref 80.0–100.0)
Monocytes Absolute: 0.9 10*3/uL (ref 0.1–1.0)
Monocytes Relative: 13 %
Neutro Abs: 3.3 10*3/uL (ref 1.7–7.7)
Neutrophils Relative %: 49 %
Platelets: 269 10*3/uL (ref 150–400)
RBC: 4.22 MIL/uL (ref 3.87–5.11)
RDW: 13.9 % (ref 11.5–15.5)
WBC: 6.8 10*3/uL (ref 4.0–10.5)
nRBC: 0 % (ref 0.0–0.2)

## 2021-11-26 LAB — PREGNANCY, URINE: Preg Test, Ur: NEGATIVE

## 2021-11-26 NOTE — ED Provider Notes (Signed)
[facility] EMERGENCY DEPARTMENT Provider Note   CSN: [id number] Arrival date & time: 11/25/21  2108     History  Chief Complaint  Patient presents with   Foot Swelling    Erin Higgins is a 51 y.o. female.  The history is provided by the patient and a parent.  Patient presents for multiple complaints She reports since November she has had swelling in her hands and feet.  She also reports she has been developing dark circles under both eyes.  Prior to this occurring she was on no medications.  For the past month she has used a variety of supplements over-the-counter that have improved her symptoms.  No chest pain or shortness of breath.  No night sweats or weight loss.  No travel. Her family became concerned about her tonight and asked her to be evaluated in the ER No previous cardiac disease.  No history of VTE    Home Medications Prior to Admission medications   Medication Sig Start Date End Date Taking? Authorizing Provider  aspirin 81 MG chewable tablet Chew 81 mg by mouth daily.    [provider]      Allergies    Penicillins    Review of Systems   Review of Systems  Constitutional:  Negative for diaphoresis, fatigue, fever and unexpected weight change.  Respiratory:  Negative for shortness of breath.   Cardiovascular:  Positive for leg swelling. Negative for chest pain.  Gastrointestinal:  Negative for blood in stool, diarrhea and vomiting.  Genitourinary:  Negative for vaginal bleeding.       Last menstrual cycle March 2022   Physical Exam Updated Vital Signs BP 130/75 (BP Location: Right Arm)   Pulse 73   Temp 98 F (36.7 C) (Oral)   Resp 17   LMP 07/28/2020   SpO2 100%  Physical Exam CONSTITUTIONAL: Well developed/well nourished HEAD: Normocephalic/atraumatic EYES: EOMI/PERRL, darkening noted under both eyes, but no edema No scleral icterus ENMT: Mucous membranes moist NECK: supple no meningeal signs SPINE/BACK:entire spine  nontender CV: S1/S2 noted, no murmurs/rubs/gallops noted LUNGS: Lungs are clear to auscultation bilaterally, no apparent distress ABDOMEN: soft, nontender, no distention NEURO: Pt is awake/alert/appropriate, moves all extremitiesx4.  No facial droop.   EXTREMITIES: pulses normal/equal, full ROM, minimal symmetric edema noted to bilateral lower extremities. There is no calf tenderness SKIN: warm, color normal PSYCH: no abnormalities of mood noted, alert and oriented to situation  ED Results / Procedures / Treatments   Labs (all labs ordered are listed, but only abnormal results are displayed) Labs Reviewed  CBC WITH DIFFERENTIAL/PLATELET - Abnormal; Notable for the following components:      Result Value   Hemoglobin 11.8 (*)    All other components within normal limits  COMPREHENSIVE METABOLIC PANEL - Abnormal; Notable for the following components:   Calcium 8.8 (*)    All other components within normal limits  URINALYSIS, ROUTINE W REFLEX MICROSCOPIC - Abnormal; Notable for the following components:   APPearance HAZY (*)    All other components within normal limits  PREGNANCY, URINE    EKG None  Radiology No results found.  Procedures Procedures    Medications Ordered in ED Medications - No data to display  ED Course/ Medical Decision Making/ A&P                           Medical Decision Making Amount and/or Complexity of Data Reviewed Labs: ordered.   This  patient presents to the ED for concern of leg swelling, this involves an extensive number of treatment options, and is a complaint that carries with it a high risk of complications and morbidity.  The differential diagnosis includes but is not limited to CHF, nephrotic syndrome, liver disease, DVT  Social Determinants of Health: Patient's impaired access to primary care  increases the complexity of managing their presentation  Additional history obtained: Additional history obtained from family   Lab  Tests: I Ordered, and personally interpreted labs.  The pertinent results include: Mild anemia   After the interventions noted above, I reevaluated the patient and found that they have :stayed the same  Complexity of problems addressed: Patient's presentation is most consistent with  acute complicated illness/injury requiring diagnostic workup  Disposition: After consideration of the diagnostic results and the patient's response to treatment,  I feel that the patent would benefit from discharge   .   Labs overall reassuring.  Patient is in no acute distress.  She was given outpatient resources.  She is appropriate for discharge home        Final Clinical Impression(s) / ED Diagnoses Final diagnoses:  Peripheral edema    Rx / DC Orders ED Discharge Orders     None         Zadie Rhine, MD 11/26/21 [phone]

## 2022-03-04 ENCOUNTER — Encounter (HOSPITAL_COMMUNITY): Payer: Self-pay | Admitting: *Deleted

## 2022-03-04 ENCOUNTER — Other Ambulatory Visit: Payer: Self-pay

## 2022-03-04 DIAGNOSIS — S0993XA Unspecified injury of face, initial encounter: Secondary | ICD-10-CM | POA: Insufficient documentation

## 2022-03-04 DIAGNOSIS — L03211 Cellulitis of face: Secondary | ICD-10-CM | POA: Insufficient documentation

## 2022-03-04 DIAGNOSIS — Y92000 Kitchen of unspecified non-institutional (private) residence as  the place of occurrence of the external cause: Secondary | ICD-10-CM | POA: Insufficient documentation

## 2022-03-04 DIAGNOSIS — W19XXXA Unspecified fall, initial encounter: Secondary | ICD-10-CM | POA: Insufficient documentation

## 2022-03-04 NOTE — ED Triage Notes (Signed)
Pt fell Monday causing loose teeth to left lower and left lower lip swelling, swelling and pain is worse. Pt states she has made an appt with dentist.  Pt fell when the dog stepped on the back of her flip flop causing pt to fall against the wall. Denies any LOC

## 2022-03-05 ENCOUNTER — Emergency Department (HOSPITAL_COMMUNITY): Payer: Self-pay

## 2022-03-05 ENCOUNTER — Emergency Department (HOSPITAL_COMMUNITY)
Admission: EM | Admit: 2022-03-05 | Discharge: 2022-03-05 | Disposition: A | Discharge status: Home / Self Care | Payer: Self-pay | Attending: Emergency Medicine | Admitting: Emergency Medicine

## 2022-03-05 DIAGNOSIS — L03211 Cellulitis of face: Secondary | ICD-10-CM

## 2022-03-05 DIAGNOSIS — S0993XA Unspecified injury of face, initial encounter: Secondary | ICD-10-CM

## 2022-03-05 MED ORDER — ONDANSETRON 4 MG PO TBDP
4.0000 mg | ORAL_TABLET | Freq: Once | ORAL | Status: AC
  Administered 2022-03-05: 4 mg via ORAL
  Filled 2022-03-05: qty 1

## 2022-03-05 MED ORDER — HYDROMORPHONE HCL 1 MG/ML IJ SOLN
1.0000 mg | Freq: Once | INTRAMUSCULAR | Status: AC
  Administered 2022-03-05: 1 mg via INTRAMUSCULAR
  Filled 2022-03-05: qty 1

## 2022-03-05 MED ORDER — OXYCODONE HCL 5 MG PO TABS: 5.0000 mg | ORAL_TABLET | ORAL | 0 refills | Status: DC | PRN

## 2022-03-05 MED ORDER — NAPROXEN 500 MG PO TABS: 500.0000 mg | ORAL_TABLET | Freq: Two times a day (BID) | ORAL | 0 refills | Status: DC

## 2022-03-05 MED ORDER — ONDANSETRON 4 MG PO TBDP: 4.0000 mg | ORAL_TABLET | Freq: Three times a day (TID) | ORAL | 0 refills | Status: DC | PRN

## 2022-03-05 MED ORDER — AMOXICILLIN-POT CLAVULANATE 875-125 MG PO TABS
1.0000 | ORAL_TABLET | Freq: Once | ORAL | Status: AC
  Administered 2022-03-05: 1 via ORAL
  Filled 2022-03-05: qty 1

## 2022-03-05 MED ORDER — AMOXICILLIN-POT CLAVULANATE 875-125 MG PO TABS: 1.0000 | ORAL_TABLET | Freq: Two times a day (BID) | ORAL | 0 refills | Status: AC

## 2022-03-05 NOTE — ED Notes (Signed)
Patient transported to CT 

## 2022-03-05 NOTE — ED Provider Notes (Signed)
[facility]-EMERGENCY DEPT [facility] Emergency Department Provider Note MRN:  [id number]  Arrival date & time: 03/05/22     Chief Complaint   Fall   History of Present Illness   Erin Higgins is a 51 y.o. year-old female with no pertinent past medical history presenting to the ED with chief complaint of fall.  There was a crowded kitchen with dogs and family members and patient's niece stepped on her flip-flop causing her to fall forward striking her face on the wall.  She has pain to the lower lip, her jaw has a lot of pain and does not feel properly aligned, she feels like some of her teeth are loose.  This trauma occurred several days ago.  No headache or vomiting.  No neck pain, no other injuries or complaints.  Review of Systems  A thorough review of systems was obtained and all systems are negative except as noted in the HPI and PMH.   Patient's Health History    Past Medical History:  Diagnosis Date   Heartburn    Herpes simplex of female genitalia     Past Surgical History:  Procedure Laterality Date   DILATION AND CURETTAGE OF UTERUS     TUBAL LIGATION  05/03/2012   Procedure: POST PARTUM TUBAL LIGATION;  Surgeon: Willodean Rosenthal, MD;  Location: [facility];  Service: Gynecology;  Laterality: Bilateral;  with filshie clips    History reviewed. No pertinent family history.  Social History   Socioeconomic History   Marital status: Single    Spouse name: Not on file   Number of children: Not on file   Years of education: Not on file   Highest education level: Not on file  Occupational History   Not on file  Tobacco Use   Smoking status: Never   Smokeless tobacco: Never  Substance and Sexual Activity   Alcohol use: Yes    Comment: occasionally   Drug use: No   Sexual activity: Yes  Other Topics Concern   Not on file  Social History Narrative   Not on file   Social Determinants of Health   Financial Resource Strain: Not on file  Food Insecurity: Not  on file  Transportation Needs: Not on file  Physical Activity: Not on file  Stress: Not on file  Social Connections: Not on file  Intimate Partner Violence: Not on file     Physical Exam   Vitals:   03/05/22 0300 03/05/22 0330  BP: 129/77 110/75  Pulse: 98 86  Resp:  18  Temp:    SpO2: 91% 95%    CONSTITUTIONAL: Well-appearing, NAD NEURO/PSYCH:  Alert and oriented x 3, no focal deficits EYES:  eyes equal and reactive ENT/NECK:  no LAD, no JVD CARDIO: Regular rate, well-perfused, normal S1 and S2 PULM:  CTAB no wheezing or rhonchi GI/GU:  non-distended, non-tender MSK/SPINE:  No gross deformities, no edema SKIN: The left lower lip is prominently swollen and tender to palpation, mildly erythematous   *Additional and/or pertinent findings included in MDM below  Diagnostic and Interventional Summary    EKG Interpretation  Date/Time:    Ventricular Rate:    PR Interval:    QRS Duration:   QT Interval:    QTC Calculation:   R Axis:     Text Interpretation:         Labs Reviewed - No data to display  CT HEAD WO CONTRAST ( )  Final Result    CT MAXILLOFACIAL WO CONTRAST  Final  Result      Medications  HYDROmorphone (DILAUDID) injection 1 mg (1 mg Intramuscular Given 03/05/22 0241)  amoxicillin-clavulanate (AUGMENTIN) 875-125 MG per tablet 1 tablet (1 tablet Oral Given 03/05/22 0350)     Procedures  /  Critical Care Procedures  ED Course and Medical Decision Making  Initial Impression and Ddx Differential diagnosis includes mandible fracture, clinically she has a facial or lip cellulitis that will need to be treated with antibiotics.  Awaiting imaging.  Past medical/surgical history that increases complexity of ED encounter: None  Interpretation of Diagnostics CT imaging is reassuring, no fractures or intracranial bleeding.  No signs of abscess within the lip.  Patient Reassessment and Ultimate Disposition/Management     Patient is sitting comfortably  on reassessment with normal vital signs, appropriate for discharge on antibiotics for facial cellulitis.  Patient management required discussion with the following services or consulting groups:  None  Complexity of Problems Addressed Acute illness or injury that poses threat of life of bodily function  Additional Data Reviewed and Analyzed Further history obtained from: None  Additional Factors Impacting ED Encounter Risk Prescriptions  Elmer Sow. Pilar Plate, MD [facility] Emergency Medicine [facility] [email or web address]  Final Clinical Impressions(s) / ED Diagnoses     ICD-10-CM   1. Facial injury, initial encounter  S09.93XA     2. Cellulitis of face  L03.211       ED Discharge Orders          Ordered    naproxen (NAPROSYN) 500 MG tablet  2 times daily        03/05/22 0410    amoxicillin-clavulanate (AUGMENTIN) 875-125 MG tablet  Every 12 hours        03/05/22 0410    oxyCODONE (ROXICODONE) 5 MG immediate release tablet  Every 4 hours PRN        03/05/22 0410             Discharge Instructions Discussed with and Provided to Patient:     Discharge Instructions      You were evaluated in the Emergency Department and after careful evaluation, we did not find any emergent condition requiring admission or further testing in the hospital.  Your exam/testing today is overall reassuring.  Swelling seems to be due to an infection.  Take the Naprosyn twice daily for pain.  Take the Augmentin antibiotic as directed to clear the infection.  Can use the oxycodone for more significant pain keeping you from sleeping at night.  Please return to the Emergency Department if you experience any worsening of your condition.   Thank you for allowing [place] to be a part of your care.       Sabas Sous, MD 03/05/22 [phone]

## 2022-03-05 NOTE — Discharge Instructions (Signed)
You were evaluated in the Emergency Department and after careful evaluation, we did not find any emergent condition requiring admission or further testing in the hospital.  Your exam/testing today is overall reassuring.  Swelling seems to be due to an infection.  Take the Naprosyn twice daily for pain.  Take the Augmentin antibiotic as directed to clear the infection.  Can use the oxycodone for more significant pain keeping you from sleeping at night.  Please return to the Emergency Department if you experience any worsening of your condition.   Thank you for allowing [place] to be a part of your care.

## 2022-03-05 NOTE — ED Notes (Signed)
Patient verbalizes understanding of discharge instructions. Opportunity for questioning and answers were provided. Armband removed by staff, pt discharged from ED. Wheeled out to lobby, leaving with family

## 2022-03-05 NOTE — ED Notes (Signed)
Patient states that she got up to walk and started vomiting. Told patient to go back to room and would make RN aware

## 2022-05-23 ENCOUNTER — Emergency Department (HOSPITAL_COMMUNITY)
Admission: EM | Admit: 2022-05-23 | Discharge: 2022-05-23 | Disposition: A | Discharge status: Home / Self Care | Payer: Self-pay | Attending: Emergency Medicine | Admitting: Emergency Medicine

## 2022-05-23 ENCOUNTER — Emergency Department (HOSPITAL_COMMUNITY): Payer: Self-pay

## 2022-05-23 ENCOUNTER — Other Ambulatory Visit: Payer: Self-pay

## 2022-05-23 DIAGNOSIS — R609 Edema, unspecified: Secondary | ICD-10-CM

## 2022-05-23 DIAGNOSIS — R600 Localized edema: Secondary | ICD-10-CM | POA: Insufficient documentation

## 2022-05-23 LAB — BASIC METABOLIC PANEL
Anion gap: 5 (ref 5–15)
BUN: 12 mg/dL (ref 6–20)
CO2: 26 mmol/L (ref 22–32)
Calcium: 9 mg/dL (ref 8.9–10.3)
Chloride: 108 mmol/L (ref 98–111)
Creatinine, Ser: 0.72 mg/dL (ref 0.44–1.00)
GFR, Estimated: >60 mL/min (ref >60)
Glucose, Bld: 100 mg/dL — ABNORMAL HIGH (ref 70–99)
Potassium: 3.7 mmol/L (ref 3.5–5.1)
Sodium: 139 mmol/L (ref 135–145)

## 2022-05-23 LAB — BRAIN NATRIURETIC PEPTIDE: B Natriuretic Peptide: 55 pg/mL (ref 0.0–100.0)

## 2022-05-23 LAB — URINALYSIS, ROUTINE W REFLEX MICROSCOPIC
Bilirubin Urine: NEGATIVE
Glucose, UA: NEGATIVE mg/dL
Hgb urine dipstick: NEGATIVE
Ketones, ur: NEGATIVE mg/dL
Leukocytes,Ua: NEGATIVE
Nitrite: NEGATIVE
Protein, ur: NEGATIVE mg/dL
Specific Gravity, Urine: 1.015 (ref 1.005–1.030)
pH: 5 (ref 5.0–8.0)

## 2022-05-23 LAB — HEPATIC FUNCTION PANEL
ALT: 12 U/L (ref 0–44)
AST: 17 U/L (ref 15–41)
Albumin: 3.6 g/dL (ref 3.5–5.0)
Alkaline Phosphatase: 43 U/L (ref 38–126)
Bilirubin, Direct: 0.1 mg/dL (ref 0.0–0.2)
Indirect Bilirubin: 0.7 mg/dL (ref 0.3–0.9)
Total Bilirubin: 0.8 mg/dL (ref 0.3–1.2)
Total Protein: 7.4 g/dL (ref 6.5–8.1)

## 2022-05-23 LAB — CBC WITH DIFFERENTIAL/PLATELET
Abs Immature Granulocytes: 0.01 10*3/uL (ref 0.00–0.07)
Basophils Absolute: 0 10*3/uL (ref 0.0–0.1)
Basophils Relative: 0 %
Eosinophils Absolute: 0 10*3/uL (ref 0.0–0.5)
Eosinophils Relative: 1 %
HCT: 37.7 % (ref 36.0–46.0)
Hemoglobin: 11.7 g/dL — ABNORMAL LOW (ref 12.0–15.0)
Immature Granulocytes: 0 %
Lymphocytes Relative: 38 %
Lymphs Abs: 2.3 10*3/uL (ref 0.7–4.0)
MCH: 27 pg (ref 26.0–34.0)
MCHC: 31 g/dL (ref 30.0–36.0)
MCV: 86.9 fL (ref 80.0–100.0)
Monocytes Absolute: 0.7 10*3/uL (ref 0.1–1.0)
Monocytes Relative: 12 %
Neutro Abs: 2.9 10*3/uL (ref 1.7–7.7)
Neutrophils Relative %: 49 %
Platelets: 271 10*3/uL (ref 150–400)
RBC: 4.34 MIL/uL (ref 3.87–5.11)
RDW: 13.9 % (ref 11.5–15.5)
WBC: 5.9 10*3/uL (ref 4.0–10.5)
nRBC: 0 % (ref 0.0–0.2)

## 2022-05-23 LAB — TSH: TSH: 2.971 u[IU]/mL (ref 0.350–4.500)

## 2022-05-23 LAB — HEPATITIS PANEL, ACUTE
HCV Ab: NONREACTIVE
Hep A IgM: NONREACTIVE
Hep B C IgM: NONREACTIVE
Hepatitis B Surface Ag: NONREACTIVE

## 2022-05-23 LAB — PROTIME-INR
INR: 1 (ref 0.8–1.2)
Prothrombin Time: 13 seconds (ref 11.4–15.2)

## 2022-05-23 LAB — HCG, SERUM, QUALITATIVE: Preg, Serum: NEGATIVE

## 2022-05-23 LAB — LIPASE, BLOOD: Lipase: 32 U/L (ref 11–51)

## 2022-05-23 MED ORDER — FUROSEMIDE 20 MG PO TABS: 20.0000 mg | ORAL_TABLET | Freq: Every day | ORAL | 0 refills | Status: DC

## 2022-05-23 NOTE — Discharge Instructions (Addendum)
It was a pleasure caring for you today in the emergency department. ° °Please return to the emergency department for any worsening or worrisome symptoms. ° ° °

## 2022-05-23 NOTE — ED Notes (Signed)
Pt requested vitals before triage. O2 99% Pulse- 74 and BP-127/80

## 2022-05-23 NOTE — ED Triage Notes (Signed)
Pt presents with facial edema, hand and foot swelling, discoloration of skin overall, changes have been occurring over several months, pt doesn't have PCP.

## 2022-05-31 NOTE — ED Provider Notes (Signed)
[facility] EMERGENCY DEPARTMENT Provider Note   CSN: [id number] Arrival date & time: 05/23/22  0945     History  Chief Complaint  Patient presents with   Edema    Erin Higgins is a 51 y.o. female.  Patient as above with significant medical history as below, including gerd, herpes who presents to the ED with complaint of swelling Pt here with report of facial/extremity swelling over last 6 mos No dib, no weakness, no weight changes that she feels are significant, no recent medication changes No orthopnea No urination changes, no frothy urine  No abd pain, no flank pain Was seen for same complaint last June , discharged in stable condition, has not f/u with pcp since that visit      Past Medical History:  Diagnosis Date   Heartburn    Herpes simplex of female genitalia     Past Surgical History:  Procedure Laterality Date   DILATION AND CURETTAGE OF UTERUS     TUBAL LIGATION  05/03/2012   Procedure: POST PARTUM TUBAL LIGATION;  Surgeon: Willodean Rosenthal, MD;  Location: [facility];  Service: Gynecology;  Laterality: Bilateral;  with filshie clips     The history is provided by the patient and a relative. No language interpreter was used.       Home Medications Prior to Admission medications   Medication Sig Start Date End Date Taking? Authorizing Provider  furosemide (LASIX) 20 MG tablet Take 1 tablet (20 mg total) by mouth daily for 14 doses. 05/23/22 06/06/22 Yes Sloan Leiter, DO      Allergies    Penicillins    Review of Systems   Review of Systems  Constitutional:  Negative for chills and fever.  HENT:  Positive for facial swelling. Negative for trouble swallowing.   Eyes:  Negative for photophobia and visual disturbance.  Respiratory:  Negative for cough and shortness of breath.   Cardiovascular:  Negative for chest pain and palpitations.  Gastrointestinal:  Negative for abdominal pain, nausea and vomiting.  Endocrine: Negative for polydipsia  and polyuria.  Genitourinary:  Negative for difficulty urinating and hematuria.  Musculoskeletal:  Negative for gait problem and joint swelling.  Skin:  Negative for pallor and rash.  Neurological:  Negative for syncope and headaches.  Psychiatric/Behavioral:  Negative for agitation and confusion.     Physical Exam Updated Vital Signs BP 101/64   Pulse 84   Temp 98.5 F (36.9 C) (Oral)   Resp 16   Ht 5\' 5"  (1.651 m)   Wt 73.1 kg   SpO2 100%   BMI 26.83 kg/m  Physical Exam Vitals and nursing note reviewed.  Constitutional:      General: She is not in acute distress.    Appearance: Normal appearance.  HENT:     Head: Normocephalic and atraumatic.     Comments: No angioedema No sig facial swelling noted     Right Ear: External ear normal.     Left Ear: External ear normal.     Nose: Nose normal.     Mouth/Throat:     Mouth: Mucous membranes are moist.  Eyes:     General: No scleral icterus.       Right eye: No discharge.        Left eye: No discharge.  Cardiovascular:     Rate and Rhythm: Normal rate and regular rhythm.     Pulses: Normal pulses.     Heart sounds: Normal heart sounds.  Pulmonary:  Effort: Pulmonary effort is normal. No respiratory distress.     Breath sounds: Normal breath sounds.  Abdominal:     General: Abdomen is flat.     Tenderness: There is no abdominal tenderness. There is no guarding or rebound.  Musculoskeletal:        General: Normal range of motion.     Cervical back: Normal range of motion.     Right lower leg: No edema.     Left lower leg: No edema.     Comments: No lymphedema on exam No pitting edema No obvious swelling    Skin:    General: Skin is warm and dry.     Capillary Refill: Capillary refill takes less than 2 seconds.  Neurological:     Mental Status: She is alert.  Psychiatric:        Mood and Affect: Mood normal.        Behavior: Behavior normal.     ED Results / Procedures / Treatments   Labs (all labs  ordered are listed, but only abnormal results are displayed) Labs Reviewed  CBC WITH DIFFERENTIAL/PLATELET - Abnormal; Notable for the following components:      Result Value   Hemoglobin 11.7 (*)    All other components within normal limits  BASIC METABOLIC PANEL - Abnormal; Notable for the following components:   Glucose, Bld 100 (*)    All other components within normal limits  URINALYSIS, ROUTINE W REFLEX MICROSCOPIC  HEPATIC FUNCTION PANEL  LIPASE, BLOOD  PROTIME-INR  HCG, SERUM, QUALITATIVE  BRAIN NATRIURETIC PEPTIDE  TSH  HEPATITIS PANEL, ACUTE    EKG None  Radiology No results found.  Procedures Procedures    Medications Ordered in ED Medications - No data to display  ED Course/ Medical Decision Making/ A&P                           Medical Decision Making Amount and/or Complexity of Data Reviewed Labs: ordered. Radiology: ordered.  Risk Prescription drug management.   This patient presents to the ED with chief complaint(s) of swelling with pertinent past medical history of as above which further complicates the presenting complaint. The complaint involves an extensive differential diagnosis and also carries with it a high risk of complications and morbidity.    The differential diagnosis includes but not limited to renal abnormality, chf, endocrine, thyroid, metabolic, etc. Serious etiologies were considered.   The initial plan is to screening labs    Additional history obtained: Additional history obtained from family Records reviewed  prior ed visits, prior labs imaging  Independent labs interpretation:  The following labs were independently interpreted:  Cbc stable Bmp stable, cr wnl Ua wnl Bnp wnl Lipase wnl Hepatic function panel is also wnl Tsh wnl  Independent visualization of imaging: - I independently visualized the following imaging with scope of interpretation limited to determining acute life threatening conditions related to  emergency care: cxr, which revealed no acute process  Cardiac monitoring was reviewed and interpreted by myself which shows nsr  Treatment and Reassessment: Stayed the same  Consultation: - Consulted or discussed management/test interpretation w/ external professional: na  Consideration for admission or further workup: Admission was considered   Pt here with report of swelling to face and extremities, this is not visualized on exam. Lab workup here is stable. No resp complaints, no pulm edema on imaging. No unilateral leg swelling that would suggest dvt. Unclear etiology of her complaints  today but does not appear to be a life threatening event. Will start on trial dose of lasix and have her f/u with pcp. RTED if worse.   The patient improved significantly and was discharged in stable condition. Detailed discussions were had with the patient regarding current findings, and need for close f/u with PCP or on call doctor. The patient has been instructed to return immediately if the symptoms worsen in any way for re-evaluation. Patient verbalized understanding and is in agreement with current care plan. All questions answered prior to discharge.    Social Determinants of health: Social History   Tobacco Use   Smoking status: Never   Smokeless tobacco: Never  Substance Use Topics   Alcohol use: Yes    Comment: occasionally   Drug use: No            Final Clinical Impression(s) / ED Diagnoses Final diagnoses:  Edema, unspecified type    Rx / DC Orders ED Discharge Orders          Ordered    furosemide (LASIX) 20 MG tablet  Daily        05/23/22 1638              Sloan Leiter, DO 05/31/22 1628

## 2022-07-12 ENCOUNTER — Emergency Department (HOSPITAL_COMMUNITY): Payer: Medicaid Other

## 2022-07-12 ENCOUNTER — Other Ambulatory Visit: Payer: Self-pay

## 2022-07-12 ENCOUNTER — Emergency Department (HOSPITAL_COMMUNITY)
Admission: EM | Admit: 2022-07-12 | Discharge: 2022-07-12 | Disposition: A | Discharge status: Home / Self Care | Payer: Medicaid Other | Attending: Emergency Medicine | Admitting: Emergency Medicine

## 2022-07-12 ENCOUNTER — Encounter (HOSPITAL_COMMUNITY): Payer: Self-pay

## 2022-07-12 DIAGNOSIS — R0789 Other chest pain: Secondary | ICD-10-CM | POA: Diagnosis present

## 2022-07-12 DIAGNOSIS — I4510 Unspecified right bundle-branch block: Secondary | ICD-10-CM | POA: Insufficient documentation

## 2022-07-12 DIAGNOSIS — R079 Chest pain, unspecified: Secondary | ICD-10-CM | POA: Insufficient documentation

## 2022-07-12 LAB — BASIC METABOLIC PANEL
Anion gap: 9 (ref 5–15)
BUN: 15 mg/dL (ref 6–20)
CO2: 24 mmol/L (ref 22–32)
Calcium: 8.7 mg/dL — ABNORMAL LOW (ref 8.9–10.3)
Chloride: 103 mmol/L (ref 98–111)
Creatinine, Ser: 0.7 mg/dL (ref 0.44–1.00)
GFR, Estimated: >60 mL/min (ref >60)
Glucose, Bld: 84 mg/dL (ref 70–99)
Potassium: 3.7 mmol/L (ref 3.5–5.1)
Sodium: 136 mmol/L (ref 135–145)

## 2022-07-12 LAB — CBG MONITORING, ED: Glucose-Capillary: 96 mg/dL (ref 70–99)

## 2022-07-12 LAB — CBC
HCT: 37.2 % (ref 36.0–46.0)
Hemoglobin: 11.7 g/dL — ABNORMAL LOW (ref 12.0–15.0)
MCH: 27.2 pg (ref 26.0–34.0)
MCHC: 31.5 g/dL (ref 30.0–36.0)
MCV: 86.5 fL (ref 80.0–100.0)
Platelets: 225 10*3/uL (ref 150–400)
RBC: 4.3 MIL/uL (ref 3.87–5.11)
RDW: 15 % (ref 11.5–15.5)
WBC: 4.7 10*3/uL (ref 4.0–10.5)
nRBC: 0 % (ref 0.0–0.2)

## 2022-07-12 LAB — TROPONIN I (HIGH SENSITIVITY)
Troponin I (High Sensitivity): 7 ng/L (ref <18)
Troponin I (High Sensitivity): 7 ng/L (ref <18)

## 2022-07-12 LAB — D-DIMER, QUANTITATIVE: D-Dimer, Quant: 0.3 ug/mL-FEU (ref 0.00–0.50)

## 2022-07-12 LAB — POC URINE PREG, ED: Preg Test, Ur: NEGATIVE

## 2022-07-12 MED ORDER — KETOROLAC TROMETHAMINE 30 MG/ML IJ SOLN
15.0000 mg | Freq: Once | INTRAMUSCULAR | Status: AC
  Administered 2022-07-12: 15 mg via INTRAVENOUS
  Filled 2022-07-12: qty 1

## 2022-07-12 MED ORDER — IBUPROFEN 600 MG PO TABS: 600.0000 mg | ORAL_TABLET | Freq: Three times a day (TID) | ORAL | 0 refills | Status: DC

## 2022-07-12 MED ORDER — ALUM & MAG HYDROXIDE-SIMETH 200-200-20 MG/5ML PO SUSP
30.0000 mL | Freq: Once | ORAL | Status: AC
  Administered 2022-07-12: 30 mL via ORAL
  Filled 2022-07-12: qty 30

## 2022-07-12 MED ORDER — ASPIRIN 81 MG PO CHEW
324.0000 mg | CHEWABLE_TABLET | Freq: Once | ORAL | Status: AC
  Administered 2022-07-12: 324 mg via ORAL
  Filled 2022-07-12: qty 4

## 2022-07-12 NOTE — ED Triage Notes (Signed)
Pt presents to ED with complaints of constant pain under left breast, started this am about 0430, feels like pressure. Pt states she has been under a lot of stress lately and it may be from that

## 2022-07-12 NOTE — ED Provider Notes (Signed)
[facility] EMERGENCY DEPARTMENT Provider Note   CSN: [id number] Arrival date & time: 07/12/22  1011     History  Chief Complaint  Patient presents with   Chest Pain    Erin Higgins is a 52 y.o. female with no significant past medical history other than heartburn, herpes simplex, presenting for evaluation of left chest pressure which she noted when she first woke up today around 5 AM.  Her symptoms have been persistent without relief, she tried eating and then tried to burp, thinking this was gas collection, did not relieve her symptoms.  She has noted this symptom is more pronounced when she stretches backwards and raises her left arm, however denies reproducible chest wall pain.  She denies nausea vomiting, diaphoresis, palpitations, also no abdominal pain, shortness of breath or other complaints.  She denies any personal or family history of early cardiac disease or risk factors for cardiac disease.  She does endorse being under a lot of family stressors over the past several months.  The history is provided by the patient.       Home Medications Prior to Admission medications   Medication Sig Start Date End Date Taking? Authorizing Provider  ibuprofen (ADVIL) 600 MG tablet Take 1 tablet (600 mg total) by mouth 3 (three) times daily. 07/12/22  Yes Ritesh Opara, Almyra Free, PA-C  OVER THE COUNTER MEDICATION Take by mouth daily. Pt states that she takes 2 vitamin B12 gummies daily.   Yes [provider]  OVER THE COUNTER MEDICATION Take by mouth daily. Pt states that she takes 2 vitamin D3 gummies daily.   Yes [provider]      Allergies    Latex and Penicillins    Review of Systems   Review of Systems  Constitutional:  Negative for chills and fever.  HENT:  Negative for congestion and sore throat.   Eyes: Negative.   Respiratory:  Negative for cough, chest tightness and shortness of breath.   Cardiovascular:  Positive for chest pain. Negative for palpitations.   Gastrointestinal:  Negative for abdominal pain, nausea and vomiting.  Genitourinary: Negative.   Musculoskeletal:  Negative for arthralgias, joint swelling and neck pain.  Skin: Negative.  Negative for rash and wound.  Neurological:  Negative for dizziness, weakness, light-headedness, numbness and headaches.  Psychiatric/Behavioral: Negative.    All other systems reviewed and are negative.   Physical Exam Updated Vital Signs BP (!) 97/59   Pulse 82   Temp 97.8 F (36.6 C) (Oral)   Resp 18   Ht 5\' 4"  (1.626 m)   Wt 63.5 kg   SpO2 98%   BMI 24.03 kg/m  Physical Exam Vitals and nursing note reviewed.  Constitutional:      Appearance: She is well-developed.  HENT:     Head: Normocephalic and atraumatic.  Eyes:     Conjunctiva/sclera: Conjunctivae normal.  Cardiovascular:     Rate and Rhythm: Normal rate and regular rhythm.     Heart sounds: Normal heart sounds.  Pulmonary:     Effort: Pulmonary effort is normal.     Breath sounds: Normal breath sounds. No wheezing or rhonchi.  Abdominal:     General: Bowel sounds are normal.     Palpations: Abdomen is soft.     Tenderness: There is no abdominal tenderness.  Musculoskeletal:        General: Normal range of motion.     Cervical back: Normal range of motion.     Right lower leg: No  tenderness. No edema.     Left lower leg: No tenderness. No edema.  Skin:    General: Skin is warm and dry.  Neurological:     General: No focal deficit present.     Mental Status: She is alert.     ED Results / Procedures / Treatments   Labs (all labs ordered are listed, but only abnormal results are displayed) Labs Reviewed  BASIC METABOLIC PANEL - Abnormal; Notable for the following components:      Result Value   Calcium 8.7 (*)    All other components within normal limits  CBC - Abnormal; Notable for the following components:   Hemoglobin 11.7 (*)    All other components within normal limits  D-DIMER, QUANTITATIVE  CBG  MONITORING, ED  POC URINE PREG, ED  TROPONIN I (HIGH SENSITIVITY)  TROPONIN I (HIGH SENSITIVITY)    EKG None ED ECG REPORT   Date: 07/12/2022  Rate: 72  Rhythm: normal sinus rhythm  QRS Axis: normal  Intervals: normal  ST/T Wave abnormalities: nonspecific ST changes  Conduction Disutrbances:right bundle branch block  Narrative Interpretation: incomplete RBBB, unchanged from prior  Old EKG Reviewed: unchanged  I have personally reviewed the EKG tracing and agree with the computerized printout as noted.  Radiology DG Chest Portable 1 View  Result Date: 07/12/2022 CLINICAL DATA:  Chest pain.  Shortness of breath. EXAM: PORTABLE CHEST 1 VIEW COMPARISON:  Prior chest radiographs 05/23/2022 and earlier. FINDINGS: Heart size at the upper limits of normal. No appreciable airspace consolidation or pulmonary edema. No evidence of pleural effusion or pneumothorax. No acute bony abnormality identified. Levocurvature of the thoracic spine. IMPRESSION: No evidence of active cardiopulmonary disease. Levocurvature of the thoracic spine. Electronically Signed   By: Kellie Simmering D.O.   On: 07/12/2022 11:38    Procedures Procedures    Medications Ordered in ED Medications  aspirin chewable tablet 324 mg (324 mg Oral Given 07/12/22 1103)  ketorolac (TORADOL) 30 MG/ML injection 15 mg (15 mg Intravenous Given 07/12/22 1542)  alum & mag hydroxide-simeth (MAALOX/MYLANTA) 200-200-20 MG/5ML suspension 30 mL (30 mLs Oral Given 07/12/22 1710)    ED Course/ Medical Decision Making/ A&P                             Medical Decision Making Patient presenting with constant chest pressure since waking at 5 AM, not associated with shortness of breath, diaphoresis, palpitations.  She does endorse increased personal stressors,, history of GERD, differential diagnoses including ACS, unstable angina, GERD, she does not have shortness of breath, but would also consider pulmonary embolism, pneumonia.  Her vital signs  have remained stable here.  She has had negative delta troponins, negative D-dimer, her chest x-ray is clear.  Although I cannot reproduce her symptoms on exam, she did endorse stretching her torso tend to worsen her symptoms suggesting a possible musculoskeletal source.  She was given IV Toradol and she did endorse pain improving from a 7 to a 4.  We also gave her a dose of Maalox which did not further improve her symptoms.  She has a heart score of 2.  Therefore she is being sent home but with close follow-up with cardiology.  Referral was given, she would probably benefit from an exercise provocative test to ensure cardiac health.  She has been placed on ibuprofen.  We also discussed return here for any worsening or new symptoms in the interim.  Amount  and/or Complexity of Data Reviewed Labs: ordered.    Details: Reviewed, significant for negative delta troponins and a normal D-dimer. Radiology: ordered.    Details: Chest x-ray reviewed and I agree with interpretation, no acute cardiopulmonary findings.  Risk OTC drugs. Prescription drug management.           Final Clinical Impression(s) / ED Diagnoses Final diagnoses:  Nonspecific chest pain    Rx / DC Orders ED Discharge Orders          Ordered    ibuprofen (ADVIL) 600 MG tablet  3 times daily        07/12/22 1813    Ambulatory referral to Cardiology       Comments: If you have not heard from the Cardiology office within the next 72 hours please call [phone].   07/12/22 1814              Burgess Amor, PA-C 07/12/22 1931    Bethann Berkshire, MD 07/13/22 [phone]

## 2022-07-12 NOTE — Discharge Instructions (Signed)
As discussed, your labs, chest x-ray and EKG are reassuring.  Your response to the Toradol suggest this may be chest wall pain, but given your age, a follow-up test with cardiology, specifically an exercise treadmill test with negative findings would be reassuring that you are completely heart healthy.  The cardiology group should contact you within the next 1 to 2 days to arrange an office follow-up with them.  In the interim, return here for any escalation or worsened or new symptoms.

## 2022-08-08 ENCOUNTER — Encounter: Payer: Self-pay | Admitting: Internal Medicine

## 2022-08-08 ENCOUNTER — Ambulatory Visit: Payer: 59 | Attending: Internal Medicine | Admitting: Internal Medicine

## 2022-08-08 ENCOUNTER — Ambulatory Visit: Payer: Medicaid Other | Admitting: Internal Medicine

## 2022-08-08 VITALS — BP 116/75 | HR 76 | Ht 64.0 in | Wt 135.0 lb

## 2022-08-08 DIAGNOSIS — R079 Chest pain, unspecified: Secondary | ICD-10-CM | POA: Insufficient documentation

## 2022-08-08 DIAGNOSIS — I7300 Raynaud's syndrome without gangrene: Secondary | ICD-10-CM | POA: Diagnosis not present

## 2022-08-08 MED ORDER — AMLODIPINE BESYLATE 2.5 MG PO TABS: 2.5000 mg | ORAL_TABLET | Freq: Every day | ORAL | 3 refills | Status: DC

## 2022-08-08 MED ORDER — METOPROLOL TARTRATE 100 MG PO TABS: 100.0000 mg | ORAL_TABLET | Freq: Once | ORAL | 0 refills | Status: DC

## 2022-08-08 NOTE — Patient Instructions (Addendum)
Medication Instructions:  Your physician has recommended you make the following change in your medication:  Start amlodipine 2.5 mg daily Continue other medications the same  Labwork: none  Testing/Procedures: Coronary CTA-see instructions below  Follow-Up: Your physician recommends that you schedule a follow-up appointment in: as needed  Any Other Special Instructions Will Be Listed Below (If Applicable). You have been referred to Rheumatology  If you need a refill on your cardiac medications before your next appointment, please call your pharmacy.    Your cardiac CT will be scheduled at one of the below locations:   [facility] [address] [place], [place] [address] [phone]  If scheduled at [facility], please arrive at the [facility] Entrance (Entrance C2) of [facility] 30 minutes prior to test start time. You can use the FREE valet parking offered at entrance C (encouraged to control the heart rate for the test)  Proceed to the [facility] Radiology Department (first floor) to check-in and test prep.  All radiology patients and guests should use entrance C2 at [facility], accessed from [facility], even though the hospital's physical address listed is [address]    Please follow these instructions carefully (unless otherwise directed):  On the Night Before the Test: Be sure to Drink plenty of water. Do not consume any caffeinated/decaffeinated beverages or chocolate 12 hours prior to your test. Do not take any antihistamines 12 hours prior to your test. If the patient has contrast allergy: No contrast allergy  On the Day of the Test: Drink plenty of water until 1 hour prior to the test. Do not eat any food 1 hour prior to test. You may take your regular medications prior to the test.  Take metoprolol (Lopressor) 100 mg two hours prior to test. FEMALES- please wear underwire-free  bra if available, avoid dresses & tight clothing      After the Test: Drink plenty of water. After receiving IV contrast, you may experience a mild flushed feeling. This is normal. On occasion, you may experience a mild rash up to 24 hours after the test. This is not dangerous. If this occurs, you can take Benadryl 25 mg and increase your fluid intake. If you experience trouble breathing, this can be serious. If it is severe call [phone] IMMEDIATELY. If it is mild, please call our office.  We will call to schedule your test 2-4 weeks out understanding that some insurance companies will need an authorization prior to the service being performed.   For non-scheduling related questions, please contact the cardiac imaging nurse navigator should you have any questions/concerns: Marchia Bond, Cardiac Imaging Nurse Navigator Gordy Clement, Cardiac Imaging Nurse Navigator [facility] Direct Office Dial: [phone]   For scheduling needs, including cancellations and rescheduling, please call [place], [phone].

## 2022-08-08 NOTE — Progress Notes (Signed)
Cardiology Office Note  Date: 08/08/2022   ID: Erin Higgins, DOB Jun 12, 1971, MRN [id number]  PCP:  Patient, No Pcp Per  Cardiologist:  None Electrophysiologist:  None   Reason for Office Visit: Evaluation chest pain at the request of Idol, PA-C   History of Present Illness: Erin Higgins is a 52 y.o. female with no PMH was referred to cardiology clinic for evaluation of chest pain.  Patient had ER visit on 07/12/2022 with chest pressure/heaviness with severe intensity for which she was evaluated in the ER. EKG was within normal limits, troponins were within normal limits.  She reported that she fell down in August 2023 after which she started to have some chest pains in the left side of her chest and shoulder but not to the extent that she had in the recent ER visit on 07/12/2022.  She has some SOB with activities.  Otherwise when she underwent chest x-ray, nonspecific lesion was noted in her left humerus most likely bone infarct or enchondroma.  She was asking about this. She also has her hands/fingers turning pale, red and blue in color, wonders if it is from poor circulation. She wears gloves now. Also has spontaneous bruises in her lower extremities, healed.  Has joint pains.  Past Medical History:  Diagnosis Date   Heartburn    Herpes simplex of female genitalia     Past Surgical History:  Procedure Laterality Date   DILATION AND CURETTAGE OF UTERUS     TUBAL LIGATION  05/03/2012   Procedure: POST PARTUM TUBAL LIGATION;  Surgeon: Erin Drafts, MD;  Location: [facility];  Service: Gynecology;  Laterality: Bilateral;  with filshie clips    Current Outpatient Medications  Medication Sig Dispense Refill   ibuprofen (ADVIL) 600 MG tablet Take 1 tablet (600 mg total) by mouth 3 (three) times daily. 15 tablet 0   OVER THE COUNTER MEDICATION Take by mouth daily. Pt states that she takes 2 vitamin B12 gummies daily.     OVER THE COUNTER MEDICATION Take by mouth daily. Pt  states that she takes 2 vitamin D3 gummies daily.     No current facility-administered medications for this visit.   Allergies:  Latex and Penicillins   Social History: The patient  reports that she has never smoked. She has never used smokeless tobacco. She reports current alcohol use. She reports that she does not use drugs.   Family History: The patient's family history includes Breast cancer in her mother; Diabetes type II in her mother; Hyperlipidemia in her mother; Hypertension in her father and mother; Sleep apnea in her mother; Ulcerative colitis in her father.   ROS:  Please see the history of present illness. Otherwise, complete review of systems is positive for none.  All other systems are reviewed and negative.   Physical Exam: VS:  BP 116/75   Pulse 76   Ht 5' 4"$  (1.626 m)   Wt 135 lb (61.2 kg)   SpO2 97%   BMI 23.17 kg/m , BMI Body mass index is 23.17 kg/m.  Wt Readings from Last 3 Encounters:  08/08/22 135 lb (61.2 kg)  07/12/22 140 lb (63.5 kg)  05/23/22 161 lb 3.2 oz (73.1 kg)    General: Patient appears comfortable at rest. HEENT: Conjunctiva and lids normal, oropharynx clear with moist mucosa. Neck: Supple, no elevated JVP or carotid bruits, no thyromegaly. Lungs: Clear to auscultation, nonlabored breathing at rest. Cardiac: Regular rate and rhythm, no S3 or significant systolic murmur,  no pericardial rub. Abdomen: Soft, nontender, no hepatomegaly, bowel sounds present, no guarding or rebound. Extremities: No pitting edema, distal pulses 2+. Skin: Warm and dry. Musculoskeletal: No kyphosis. Neuropsychiatric: Alert and oriented x3, affect grossly appropriate.  ECG: Sinus rhythm  Recent Labwork: 05/23/2022: ALT 12; AST 17; B Natriuretic Peptide 55.0; TSH 2.971 07/12/2022: BUN 15; Creatinine, Ser 0.70; Hemoglobin 11.7; Platelets 225; Potassium 3.7; Sodium 136  No results found for: "CHOL", "TRIG", "HDL", "CHOLHDL", "VLDL", "LDLCALC", "LDLDIRECT"  Other  Studies Reviewed Today:   Assessment and Plan: Patient is a 52 year old F with no PMH was referred to cardiology clinic for evaluation of chest pressure.  # Chest pain -Obtain CT cardiac to rule out significant CAD, anomalous origin of coronaries and evaluation of aorta.  # Possible Raynaud's phenomenon -Patient has joint pains, spontaneous bruises (healed), Raynaud's.  Chest x-ray showed nonspecific lesion in the left humerus possibly bone infarct or enchondroma. Ambulatory referral to rheumatology for evaluation of any current tissue diseases. -Start amlodipine 2.5 mg once daily  I have spent a total of 45 minutes with patient reviewing chart, EKGs, labs and examining patient as well as establishing an assessment and plan that was discussed with the patient.  > 50% of time was spent in direct patient care.      Medication Adjustments/Labs and Tests Ordered: Current medicines are reviewed at length with the patient today.  Concerns regarding medicines are outlined above.   Tests Ordered: No orders of the defined types were placed in this encounter.   Medication Changes: No orders of the defined types were placed in this encounter.   Disposition:  Follow up prn  Signed Jep Dyas Fidel Levy, MD, 08/08/2022 1:14 PM    [facility], [place], [place] [address]

## 2022-09-16 ENCOUNTER — Telehealth (HOSPITAL_COMMUNITY): Payer: Self-pay | Admitting: *Deleted

## 2022-09-16 ENCOUNTER — Telehealth (HOSPITAL_COMMUNITY): Payer: Self-pay | Admitting: Emergency Medicine

## 2022-09-16 NOTE — Telephone Encounter (Signed)
Patient returning call about her upcoming cardiac imaging study; pt verbalizes understanding of appt date/time, parking situation and where to check in, pre-test NPO status and medications ordered, and verified current allergies; name and call back number provided for further questions should they arise  Gordy Clement RN Navigator Cardiac Imaging [facility] [phone] office [phone] cell  Patient to hold her amlodipine and take 100mg  metoprolol tartrate two hours prior to her cardiac CT scan.  She is aware to arrive at 2pm.

## 2022-09-16 NOTE — Telephone Encounter (Signed)
Attempted to call patient regarding upcoming cardiac CT appointment. °Left message on voicemail with name and callback number °Shylin Keizer RN Navigator Cardiac Imaging °[facility] °[phone] Office °[phone] Cell ° °

## 2022-09-19 ENCOUNTER — Ambulatory Visit (HOSPITAL_COMMUNITY)
Admission: RE | Admit: 2022-09-19 | Discharge: 2022-09-19 | Disposition: A | Discharge status: Home / Self Care | Payer: 59 | Source: Ambulatory Visit | Attending: Internal Medicine | Admitting: Internal Medicine

## 2022-09-19 DIAGNOSIS — R079 Chest pain, unspecified: Secondary | ICD-10-CM | POA: Diagnosis present

## 2022-09-19 DIAGNOSIS — R072 Precordial pain: Secondary | ICD-10-CM | POA: Diagnosis not present

## 2022-09-19 MED ORDER — NITROGLYCERIN 0.4 MG SL SUBL
0.8000 mg | SUBLINGUAL_TABLET | Freq: Once | SUBLINGUAL | Status: AC
  Administered 2022-09-19: 0.8 mg via SUBLINGUAL

## 2022-09-19 MED ORDER — NITROGLYCERIN 0.4 MG SL SUBL
SUBLINGUAL_TABLET | SUBLINGUAL | Status: AC
  Filled 2022-09-19: qty 2

## 2022-09-19 MED ORDER — METOPROLOL TARTRATE 5 MG/5ML IV SOLN: 5.0000 mg | Freq: Once | INTRAVENOUS | Status: DC

## 2022-09-19 MED ORDER — METOPROLOL TARTRATE 5 MG/5ML IV SOLN
INTRAVENOUS | Status: AC
  Filled 2022-09-19: qty 5

## 2022-09-19 MED ORDER — IOHEXOL 350 MG/ML SOLN
100.0000 mL | Freq: Once | INTRAVENOUS | Status: AC | PRN
  Administered 2022-09-19: 100 mL via INTRAVENOUS

## 2022-10-14 ENCOUNTER — Other Ambulatory Visit: Payer: Self-pay | Admitting: Internal Medicine
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
## 2022-12-19 ENCOUNTER — Ambulatory Visit: Payer: 59 | Attending: Internal Medicine | Admitting: Internal Medicine

## 2022-12-19 ENCOUNTER — Encounter: Payer: Self-pay | Admitting: Internal Medicine

## 2022-12-19 VITALS — BP 123/80 | HR 71 | Resp 14 | Ht 65.25 in | Wt 139.0 lb

## 2022-12-19 DIAGNOSIS — E559 Vitamin D deficiency, unspecified: Secondary | ICD-10-CM | POA: Diagnosis not present

## 2022-12-19 DIAGNOSIS — R21 Rash and other nonspecific skin eruption: Secondary | ICD-10-CM | POA: Insufficient documentation

## 2022-12-19 DIAGNOSIS — L943 Sclerodactyly: Secondary | ICD-10-CM | POA: Diagnosis not present

## 2022-12-19 DIAGNOSIS — I7300 Raynaud's syndrome without gangrene: Secondary | ICD-10-CM | POA: Diagnosis not present

## 2022-12-20 LAB — ANTI-SMITH ANTIBODY: ENA SM Ab Ser-aCnc: <1 AI

## 2022-12-20 LAB — PROTEIN / CREATININE RATIO, URINE: Protein/Creat Ratio: 180 mg/g creat (ref 24–184)

## 2022-12-21 LAB — ANTI-NUCLEAR AB-TITER (ANA TITER): ANA Titer 1: 1:1280 {titer} — ABNORMAL HIGH

## 2022-12-21 LAB — SJOGRENS SYNDROME-A EXTRACTABLE NUCLEAR ANTIBODY: SSA (Ro) (ENA) Antibody, IgG: <1 AI

## 2022-12-21 LAB — PROTEIN / CREATININE RATIO, URINE: Creatinine, Urine: 205 mg/dL (ref 20–275)

## 2022-12-21 LAB — ANA: Anti Nuclear Antibody (ANA): POSITIVE — AB

## 2022-12-25 LAB — SYSTEMIC SCLEROSIS (SCLERODERMA) 12 ANTIBODIES PANEL 2
Centromere protein A Ab: <11 SI (ref <11)
Centromere protein B Ab: <11 SI (ref <11)
Fibrillarin Ab: 35 SI — ABNORMAL HIGH (ref <11)
PM-SCL-100 Ab: <11 SI (ref <11)
PM-SCL-75 Ab: <11 SI (ref <11)
RNA polymerase III RP11 Ab: <11 SI (ref <11)
RNA polymerase III RP155 Ab: <11 SI (ref <11)
SCL-70 extractable nuclear Ab: <11 SI (ref <11)
Th-To Ab: <11 SI (ref <11)
U1 small nuclear ribonucleoprotein 70kD Ab: <11 SI (ref <11)
U1 small nuclear ribonucleoprotein A Ab: <11 SI (ref <11)
U1 small nuclear ribonucleoprotein C Ab: <11 SI (ref <11)

## 2022-12-25 LAB — C3 AND C4
C3 Complement: 113 mg/dL (ref 83–193)
C4 Complement: 26 mg/dL (ref 15–57)

## 2022-12-25 LAB — SEDIMENTATION RATE: Sed Rate: 17 mm/h (ref 0–20)

## 2022-12-25 LAB — ANTI-DNA ANTIBODY, DOUBLE-STRANDED: ds DNA Ab: 1 IU/mL

## 2022-12-25 LAB — PROTEIN / CREATININE RATIO, URINE
Protein/Creatinine Ratio: 0.18 mg/mg creat (ref 0.024–0.184)
Total Protein, Urine: 37 mg/dL — ABNORMAL HIGH (ref 5–24)

## 2022-12-25 LAB — ANTI-NUCLEAR AB-TITER (ANA TITER)

## 2022-12-25 LAB — C-REACTIVE PROTEIN: CRP: 16 mg/L — ABNORMAL HIGH (ref <8.0)

## 2023-01-18 ENCOUNTER — Encounter: Payer: Self-pay | Admitting: Internal Medicine

## 2023-01-18 ENCOUNTER — Ambulatory Visit: Payer: 59 | Attending: Internal Medicine | Admitting: Internal Medicine

## 2023-01-18 VITALS — BP 133/89 | HR 72 | Resp 14 | Ht 65.0 in | Wt 133.0 lb

## 2023-01-18 DIAGNOSIS — M349 Systemic sclerosis, unspecified: Secondary | ICD-10-CM | POA: Diagnosis not present

## 2023-01-18 DIAGNOSIS — I7300 Raynaud's syndrome without gangrene: Secondary | ICD-10-CM

## 2023-01-18 DIAGNOSIS — L943 Sclerodactyly: Secondary | ICD-10-CM

## 2023-01-18 MED ORDER — METHOTREXATE SODIUM 2.5 MG PO TABS
15.0000 mg | ORAL_TABLET | ORAL | 0 refills | Status: AC
Start: 2023-01-18 — End: ?

## 2023-01-18 MED ORDER — FOLIC ACID 1 MG PO TABS: 1.0000 mg | ORAL_TABLET | Freq: Every day | ORAL | 0 refills | Status: DC
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
## 2023-01-18 NOTE — Patient Instructions (Signed)
Methotrexate Tablets What is this medication? METHOTREXATE (METH oh TREX ate) treats autoimmune conditions, such as arthritis and psoriasis. It works by decreasing inflammation, which can reduce pain and prevent long-term injury to the joints and skin. It may also be used to treat some types of cancer. It works by slowing down the growth of cancer cells. This medicine may be used for other purposes; ask your health care provider or pharmacist if you have questions. COMMON BRAND NAME(S): Rheumatrex, Trexall What should I tell my care team before I take this medication? They need to know if you have any of these conditions: Dehydration Diabetes Fluid in the stomach area or lungs Frequently drink alcohol Having surgery, including dental surgery High cholesterol Immune system problems Inflammatory bowel disease, such as ulcerative colitis Kidney disease Liver disease Low blood cell levels (white cells, red cells, and platelets) Lung disease Recent or ongoing radiation Recent or upcoming vaccine Stomach ulcers, other stomach or intestine problems An unusual or allergic reaction to methotrexate, other medications, foods, dyes, or preservatives Pregnant or trying to get pregnant Breastfeeding How should I use this medication? Take this medication by mouth with water. Take it as directed on the prescription label. Do not take extra. Keep taking this medication until your care team tells you to stop. Know why you are taking this medication and how you should take it. To treat conditions such as arthritis and psoriasis, this medication is taken ONCE A WEEK as a single dose or divided into 3 smaller doses taken 12 hours apart (do not take more than 3 doses 12 hours apart each week). This medication is NEVER taken daily to treat conditions other than cancer. Taking this medication more often than directed can cause serious side effects, even death. Talk to your care team about why you are taking this  medication, how often you will take it, and what your dose is. Ask your care team to put the reason you take this medication on the prescription. If you take this medication ONCE A WEEK, choose a day of the week before you start. Ask your pharmacist to include the day of the week on the label. Avoid "Monday", which could be misread as "Morning". Handling this medication may be harmful. Talk to your care team about how to handle this medication. Special instructions may apply. Talk to your care team about the use of this medication in children. While it may be prescribed for selected conditions, precautions do apply. Overdosage: If you think you have taken too much of this medicine contact a poison control center or emergency room at once. NOTE: This medicine is only for you. Do not share this medicine with others. What if I miss a dose? If you miss a dose, talk with your care team. Do not take double or extra doses. What may interact with this medication? Do not take this medication with any of the following: Acitretin Live virus vaccines Probenecid This medication may also interact with the following: Alcohol Aspirin and aspirin-like medications Certain antibiotics, such as penicillin, neomycin, sulfamethoxazole; trimethoprim Certain medications for stomach problems, such as lansoprazole, omeprazole, pantoprazole Clozapine Cyclosporine Dapsone Folic acid Foscarnet NSAIDs, medications for pain and inflammation, such as ibuprofen or naproxen Phenytoin Pyrimethamine Steroid medications, such as prednisone or cortisone Tacrolimus Theophylline This list may not describe all possible interactions. Give your health care provider a list of all the medicines, herbs, non-prescription drugs, or dietary supplements you use. Also tell them if you smoke, drink alcohol, or use  illegal drugs. Some items may interact with your medicine. What should I watch for while using this medication? Visit your  care team for regular checks on your progress. It may be some time before you see the benefit from this medication. You may need blood work done while you are taking this medication. If your care team has also prescribed folic acid, they may instruct you to skip your folic acid dose on the day you take methotrexate. This medication can make you more sensitive to the sun. Keep out of the sun. If you cannot avoid being in the sun, wear protective clothing and sunscreen. Do not use sun lamps, tanning beds, or tanning booths. Check with your care team if you have severe diarrhea, nausea, and vomiting, or if you sweat a lot. The loss of too much body fluid may make it dangerous for you to take this medication. This medication may increase your risk of getting an infection. Call your care team for advice if you get a fever, chills, sore throat, or other symptoms of a cold or flu. Do not treat yourself. Try to avoid being around people who are sick. Talk to your care team about your risk of cancer. You may be more at risk for certain types of cancers if you take this medication. Talk to your care team if you or your partner may be pregnant. Serious birth defects can occur if you take this medication during pregnancy and for 6 months after the last dose. You will need a negative pregnancy test before starting this medication. Contraception is recommended while taking this medication and for 6 months after the last dose. Your care team can help you find the option that works for you. If your partner can get pregnant, use a condom during sex while taking this medication and for 3 months after the last dose. Do not breastfeed while taking this medication and for 1 week after the last dose. This medication may cause infertility. Talk to your care team if you are concerned about your fertility. What side effects may I notice from receiving this medication? Side effects that you should report to your care team as soon  as possible: Allergic reactions--skin rash, itching, hives, swelling of the face, lips, tongue, or throat Dry cough, shortness of breath or trouble breathing Infection--fever, chills, cough, sore throat, wounds that don't heal, pain or trouble when passing urine, general feeling of discomfort or being unwell Kidney injury--decrease in the amount of urine, swelling of the ankles, hands, or feet Liver injury--right upper belly pain, loss of appetite, nausea, light-colored stool, dark yellow or brown urine, yellowing skin or eyes, unusual weakness or fatigue Low red blood cell level--unusual weakness or fatigue, dizziness, headache, trouble breathing Pain, tingling, or numbness in the hands or feet, muscle weakness, change in vision, confusion or trouble speaking, loss of balance or coordination, trouble walking, seizures Redness, blistering, peeling, or loosening of the skin, including inside the mouth Stomach bleeding--bloody or black, tar-like stools, vomiting blood or brown material that looks like coffee grounds Stomach pain that is severe, does not away, or gets worse Unusual bruising or bleeding Side effects that usually do not require medical attention (report these to your care team if they continue or are bothersome): Diarrhea Dizziness Hair loss Nausea Pain, redness, or swelling with sores inside the mouth or throat Skin reactions on sun-exposed areas Vomiting This list may not describe all possible side effects. Call your doctor for medical advice about side effects. You  may report side effects to FDA at 1-800-FDA-1088. Where should I keep my medication? Keep out of the reach of children and pets. Store at room temperature between 20 and 25 degrees C (68 and 77 degrees F). Protect from light. Keep the container tightly closed. Get rid of any unused medication after the expiration date. To get rid of medications that are no longer needed or have expired: Take the medication to a  medication take-back program. Check with your pharmacy or law enforcement to find a location. If you cannot return the medication, ask your pharmacist or care team how to get rid of this medication safely. NOTE: This sheet is a summary. It may not cover all possible information. If you have questions about this medicine, talk to your doctor, pharmacist, or health care provider.  2024 [employer]vier/Gold Standard (2022-07-05 00:00:00)

## 2023-01-25 ENCOUNTER — Encounter: Payer: 59 | Admitting: Internal Medicine

## 2023-01-30 ENCOUNTER — Other Ambulatory Visit: Payer: Self-pay

## 2023-01-30 ENCOUNTER — Encounter (HOSPITAL_COMMUNITY): Payer: Self-pay | Admitting: Occupational Therapy

## 2023-01-30 ENCOUNTER — Ambulatory Visit (HOSPITAL_COMMUNITY): Payer: 59 | Attending: Internal Medicine | Admitting: Occupational Therapy

## 2023-01-30 DIAGNOSIS — R29898 Other symptoms and signs involving the musculoskeletal system: Secondary | ICD-10-CM | POA: Diagnosis present

## 2023-01-30 DIAGNOSIS — M79641 Pain in right hand: Secondary | ICD-10-CM | POA: Diagnosis present

## 2023-01-30 DIAGNOSIS — L943 Sclerodactyly: Secondary | ICD-10-CM | POA: Insufficient documentation

## 2023-01-30 DIAGNOSIS — M349 Systemic sclerosis, unspecified: Secondary | ICD-10-CM | POA: Diagnosis not present

## 2023-01-30 DIAGNOSIS — M79642 Pain in left hand: Secondary | ICD-10-CM | POA: Insufficient documentation

## 2023-01-30 NOTE — Patient Instructions (Signed)

## 2023-01-30 NOTE — Therapy (Signed)
OUTPATIENT OCCUPATIONAL THERAPY ORTHO EVALUATION  Patient Name: Erin Higgins MRN: [id number] DOB:[birth year], 52 y.o., female Today's Date: 01/30/2023    END OF SESSION:  OT End of Session - 01/30/23 1528     Visit Number 1    Number of Visits 6    Date for OT Re-Evaluation 03/13/23    Authorization Type [employer], $80 copay    Authorization Time Period no visit limit    OT Start Time 1434    OT Stop Time 1512    OT Time Calculation (min) 38 min    Activity Tolerance Patient tolerated treatment well    Behavior During Therapy WFL for tasks assessed/performed             Past Medical History:  Diagnosis Date   Heartburn    Herpes simplex of female genitalia    Past Surgical History:  Procedure Laterality Date   DILATION AND CURETTAGE OF UTERUS     TUBAL LIGATION  05/03/2012   Procedure: POST PARTUM TUBAL LIGATION;  Surgeon: Willodean Rosenthal, MD;  Location: [facility];  Service: Gynecology;  Laterality: Bilateral;  with filshie clips   Patient Active Problem List   Diagnosis Date Noted   Systemic sclerosis (HCC) 01/18/2023   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 Sheliah Hatch  ONSET DATE: approximately 1 year ago  REFERRING DIAG:  L94.3 (ICD-10-CM) - Sclerodactyly  M34.9 (ICD-10-CM) - Systemic sclerosis (HCC)    THERAPY DIAG:  Other symptoms and signs involving the musculoskeletal system  Pain in left hand  Pain in right hand  Rationale for Evaluation and Treatment: Rehabilitation  SUBJECTIVE:   SUBJECTIVE STATEMENT: S: This has been going on for about a year.  Pt accompanied by: family member  PERTINENT HISTORY: Pt is a 52 y/o female presenting for OT evaluation with dx of scerlodactyly and systemic sclerosis. Pt reports symptoms  have been present for approximately 1 year. Pt with edema, skin dryness and hardening, and generalized pain and weakness.   PRECAUTIONS: Fall  WEIGHT BEARING RESTRICTIONS: No  PAIN:  Are you having pain? Yes: NPRS scale: 8/10 Pain location: hands and thighs Pain description: aching, sore, tight Aggravating factors: cold weather Relieving factors: muscle relaxer, nothing helping hands  FALLS: Has patient fallen in last 6 months? Yes. Number of falls 1  PLOF: Independent  PATIENT GOALS: To improve independence and ability to complete tasks.   NEXT MD VISIT: 03/01/23  OBJECTIVE:   HAND DOMINANCE: Left  ADLs: Overall ADLs: Pt reports difficulty with getting in and out of the bed, reaching back to brush hair, reaching back to scratch her back and wash her back. Pt cannot lift pots and pans for cooking, tedious tasks are difficulty at times. Pt has difficulty sleeping due to pain.   UPPER EXTREMITY ROM:     Active ROM Right eval Left eval  Shoulder flexion 118 115  Shoulder abduction 112 101  Shoulder internal rotation 90 90  Shoulder external rotation 55 45  Wrist flexion 40 44  Wrist extension 38 45  Wrist ulnar deviation 28 15  Wrist radial deviation 12 18  Wrist pronation 90 90  Wrist supination 90 90  (Blank rows = not tested)  Active ROM Right eval Left eval  Thumb MCP (0-60) 55 52  Thumb IP (0-80)  52 48  Index MCP (0-90) 60  58  Index PIP (0-100) 75  76  Index DIP (0-70)  30  20  Long MCP (0-90)  66  62  Long PIP (0-100)  90  68  Long DIP (0-70)  38  40  Ring MCP (0-90)  75  68  Ring PIP (0-100)  80  80  Ring DIP (0-70)  41  28  Little MCP (0-90)  76  44  Little PIP (0-100)  80  70  Little DIP (0-70)  50  30  (Blank rows = not tested)   UPPER EXTREMITY MMT:     MMT Right eval Left eval  Shoulder flexion 4+/5 4+/5  Shoulder abduction 4+/5 4+/5  Shoulder internal rotation 5/5 5/5  Shoulder external rotation 5/5 4+/5  Elbow flexion 5/5 5/5  Elbow  extension 4/5 4/5  Wrist flexion 5/5 5/5  Wrist extension 5/5 5/5  Wrist ulnar deviation 5/5 4/5  Wrist radial deviation 5/5 4/5  Wrist pronation 5/5 5/5  Wrist supination 5/5 5/5  (Blank rows = not tested)  HAND FUNCTION: Grip strength: Right: 28 lbs; Left: 26 lbs, Lateral pinch: Right: 9 lbs, Left: 9 lbs, and 3 point pinch: Right: 8 lbs, Left: 9 lbs  COORDINATION: 9 Hole Peg test: Right: 24.31 sec; Left: 24.90 sec  SENSATION: Tingling in hands with cold; numb feeling in left foot  EDEMA: generalized-mild  COGNITION: Overall cognitive status: Within functional limits for tasks assessed   TODAY'S TREATMENT:                                                                                                                              DATE: N/A-eval only     PATIENT EDUCATION: Education details: red theraputty grip and pinch exercises Person educated: Patient and Child(ren) Education method: Explanation, Demonstration, and Handouts Education comprehension: verbalized understanding and returned demonstration  HOME EXERCISE PROGRAM: Eval: red theraputty grip and pinch exercises.   GOALS: Goals reviewed with patient? Yes  SHORT TERM GOALS: Target date: 02/20/23  Pt will be provided with and educated on HEP to maintain mobility required for independence in ADL completion.   Goal status: INITIAL   LONG TERM GOALS: Target date: 03/13/23  Pt will be educated on AE and DME available for use during ADLs to maintain safety and independence during completion.   Goal status: INITIAL  2.  Pt will increase BUE strength to 5/5 to improve ability to perform lightweight lifting tasks during ADLs and housework completion.    Goal status: INITIAL  3.  Pt will increase bilateral grip strength by 15# and pinch strength by 3# to improve ability to grasp and hold pots and pans when cooking.   Goal status: INITIAL  4.  Pt will be educated on pain management techniques to improve  independence and comfort during ADL tasks.   Goal status: INITIAL  5.  Pt will increase BUE shoulder A/ROM by 10  degrees to improve ability to reach back and brush her hair or scratch her back.   Goal status: INITIAL  6.  Pt will be educated on energy conservation strategies to improve activity tolerance required for functional task completion.   Goal status: INITIAL  ASSESSMENT:  CLINICAL IMPRESSION: Patient is a 52 y.o. female who was seen today for occupational therapy evaluation for systemic sclerosis and scerodactyly. Pt reports decline in independence and ability to complete ADLs due to pain, weakness, and fatigue.  Pt with good BUE strength, however grip and pinch strength are poor. Pt also with generalized pain and edema, as well as limited activity tolerance. Educated on HEP for grip strengthening today.   PERFORMANCE DEFICITS: in functional skills including ADLs, IADLs, coordination, dexterity, edema, ROM, strength, pain, fascial restrictions, and UE functional use  IMPAIRMENTS: are limiting patient from ADLs, IADLs, rest and sleep, and leisure.   COMORBIDITIES: has no other co-morbidities that affects occupational performance. Patient will benefit from skilled OT to address above impairments and improve overall function.  MODIFICATION OR ASSISTANCE TO COMPLETE EVALUATION: No modification of tasks or assist necessary to complete an evaluation.  OT OCCUPATIONAL PROFILE AND HISTORY: Problem focused assessment: Including review of records relating to presenting problem.  CLINICAL DECISION MAKING: LOW - limited treatment options, no task modification necessary  REHAB POTENTIAL: Fair dx  EVALUATION COMPLEXITY: Low      PLAN:  OT FREQUENCY: 1x/week  OT DURATION: 6 weeks  PLANNED INTERVENTIONS: therapeutic exercise, therapeutic activity, manual therapy, passive range of motion, electrical stimulation, patient/family education, and DME and/or AE  instructions  RECOMMENDED OTHER SERVICES: Dietician  CONSULTED AND AGREED WITH PLAN OF CARE: Patient  PLAN FOR NEXT SESSION: Follow up on HEP, being shoulder stretches and add to HEP, grip and pinch strengthening   Ezra Sites, OTR/L  [phone] 01/30/2023, 4:01 PM

## 2023-02-01 ENCOUNTER — Telehealth: Payer: Self-pay

## 2023-02-01 DIAGNOSIS — M349 Systemic sclerosis, unspecified: Secondary | ICD-10-CM

## 2023-02-01 NOTE — Telephone Encounter (Signed)
-----   Message from Vishnu P Mallipeddi sent at 01/27/2023  7:54 AM EDT ----- Thank you Dr Dimple Casey. I will get her scheduled to undergo Echocardiogram.  Laurence Folz, can you order 2D Echo please. Diagnosis: Scleroderma, to r/o pulmonary HTN. ----- Message ----- From: Fuller Plan, MD Sent: 01/25/2023  10:56 PM EDT To: Marjo Bicker, MD  Thank you for referring Ms. Grindstaff heart symptoms and lab findings are consistent with scleroderma.  I see where she has coronary CT scan that looked good.  If not already she could benefit from 2D echocardiogram screening for pulmonary artery hypertension.  Orest Dikes

## 2023-02-01 NOTE — Telephone Encounter (Signed)
Called patient and left detailed message regarding Echo that was ordered by Dr. Jenene Slicker and to call the office to get scheduled.

## 2023-02-10 ENCOUNTER — Ambulatory Visit (HOSPITAL_COMMUNITY): Payer: 59 | Admitting: Occupational Therapy

## 2023-02-10 ENCOUNTER — Encounter (HOSPITAL_COMMUNITY): Payer: Self-pay | Admitting: Occupational Therapy

## 2023-02-10 DIAGNOSIS — R29898 Other symptoms and signs involving the musculoskeletal system: Secondary | ICD-10-CM | POA: Diagnosis not present

## 2023-02-10 DIAGNOSIS — M79641 Pain in right hand: Secondary | ICD-10-CM

## 2023-02-10 DIAGNOSIS — M79642 Pain in left hand: Secondary | ICD-10-CM

## 2023-02-10 NOTE — Therapy (Signed)
OUTPATIENT OCCUPATIONAL THERAPY ORTHO TREATMENT  Patient Name: Erin Higgins MRN: [id number] DOB:1971-01-19, 52 y.o., female Today's Date: 02/10/2023    END OF SESSION:  OT End of Session - 02/10/23 1404     Visit Number 2    Number of Visits 6    Date for OT Re-Evaluation 03/13/23    Authorization Type [employer], $80 copay    Authorization Time Period no visit limit    OT Start Time 1305    OT Stop Time 1345    OT Time Calculation (min) 40 min    Activity Tolerance Patient tolerated treatment well    Behavior During Therapy WFL for tasks assessed/performed              Past Medical History:  Diagnosis Date   Heartburn    Herpes simplex of female genitalia    Past Surgical History:  Procedure Laterality Date   DILATION AND CURETTAGE OF UTERUS     TUBAL LIGATION  05/03/2012   Procedure: POST PARTUM TUBAL LIGATION;  Surgeon: Willodean Rosenthal, MD;  Location: [facility];  Service: Gynecology;  Laterality: Bilateral;  with filshie clips   Patient Active Problem List   Diagnosis Date Noted   Systemic sclerosis (HCC) 01/18/2023   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 Sheliah Hatch  ONSET DATE: approximately 1 year ago  REFERRING DIAG:  L94.3 (ICD-10-CM) - Sclerodactyly  M34.9 (ICD-10-CM) - Systemic sclerosis (HCC)    THERAPY DIAG:  Other symptoms and signs involving the musculoskeletal system  Pain in left hand  Pain in right hand  Rationale for Evaluation and Treatment: Rehabilitation  SUBJECTIVE:   SUBJECTIVE STATEMENT: S: This has been going on for about a year.  Pt accompanied by: family member  PERTINENT HISTORY: Pt is a 51 y/o female presenting for OT evaluation with dx of scerlodactyly and systemic sclerosis. Pt reports  symptoms have been present for approximately 1 year. Pt with edema, skin dryness and hardening, and generalized pain and weakness.   PRECAUTIONS: Fall  WEIGHT BEARING RESTRICTIONS: No  PAIN:  Are you having pain? Yes: NPRS scale: 5/10 Pain location: hands and thighs Pain description: aching, sore, tight Aggravating factors: cold weather Relieving factors: muscle relaxer, nothing helping hands  FALLS: Has patient fallen in last 6 months? Yes. Number of falls 1  PLOF: Independent  PATIENT GOALS: To improve independence and ability to complete tasks.   NEXT MD VISIT: 03/01/23  OBJECTIVE:   HAND DOMINANCE: Left  ADLs: Overall ADLs: Pt reports difficulty with getting in and out of the bed, reaching back to brush hair, reaching back to scratch her back and wash her back. Pt cannot lift pots and pans for cooking, tedious tasks are difficulty at times. Pt has difficulty sleeping due to pain.   UPPER EXTREMITY ROM:     Active ROM Right eval Left eval  Shoulder flexion 118 115  Shoulder abduction 112 101  Shoulder internal rotation 90 90  Shoulder external rotation 55 45  Wrist flexion 40 44  Wrist extension 38 45  Wrist ulnar deviation 28 15  Wrist radial deviation 12 18  Wrist pronation 90 90  Wrist supination 90 90  (Blank rows = not tested)  Active ROM Right eval Left eval  Thumb MCP (0-60) 55 52  Thumb IP (  0-80) 52 48  Index MCP (0-90) 60  58  Index PIP (0-100) 75  76  Index DIP (0-70)  30  20  Long MCP (0-90)  66  62  Long PIP (0-100)  90  68  Long DIP (0-70)  38  40  Ring MCP (0-90)  75  68  Ring PIP (0-100)  80  80  Ring DIP (0-70)  41  28  Little MCP (0-90)  76  44  Little PIP (0-100)  80  70  Little DIP (0-70)  50  30  (Blank rows = not tested)   UPPER EXTREMITY MMT:     MMT Right eval Left eval  Shoulder flexion 4+/5 4+/5  Shoulder abduction 4+/5 4+/5  Shoulder internal rotation 5/5 5/5  Shoulder external rotation 5/5 4+/5  Elbow flexion 5/5  5/5  Elbow extension 4/5 4/5  Wrist flexion 5/5 5/5  Wrist extension 5/5 5/5  Wrist ulnar deviation 5/5 4/5  Wrist radial deviation 5/5 4/5  Wrist pronation 5/5 5/5  Wrist supination 5/5 5/5  (Blank rows = not tested)  HAND FUNCTION: Grip strength: Right: 28 lbs; Left: 26 lbs, Lateral pinch: Right: 9 lbs, Left: 9 lbs, and 3 point pinch: Right: 8 lbs, Left: 9 lbs  COORDINATION: 9 Hole Peg test: Right: 24.31 sec; Left: 24.90 sec  SENSATION: Tingling in hands with cold; numb feeling in left foot  EDEMA: generalized-mild  COGNITION: Overall cognitive status: Within functional limits for tasks assessed   TODAY'S TREATMENT:                                                                                                                              DATE:   02/10/2023 -P/ROM: supine; flexion, abduction, internal/external rotation 10x each  -AA/ROM: supine; flexion, abduction, internal/external rotation 10x each  -A/ROM: -Shoulder stretch: flexion, internal rotation, external rotation, corner stretch, posterior capsule stretch 15 second hold 2x each, BUE -Pinch: clip tree placing green clips on and off tree with BL hands      PATIENT EDUCATION: Education details: red theraputty grip and pinch exercises Person educated: Patient and Child(ren) Education method: Explanation, Demonstration, and Handouts Education comprehension: verbalized understanding and returned demonstration  HOME EXERCISE PROGRAM: Eval: red theraputty grip and pinch exercises.   GOALS: Goals reviewed with patient? Yes  SHORT TERM GOALS: Target date: 02/20/23  Pt will be provided with and educated on HEP to maintain mobility required for independence in ADL completion.   Goal status: INITIAL   LONG TERM GOALS: Target date: 03/13/23  Pt will be educated on AE and DME available for use during ADLs to maintain safety and independence during completion.   Goal status: INITIAL  2.  Pt will increase BUE  strength to 5/5 to improve ability to perform lightweight lifting tasks during ADLs and housework completion.    Goal status: INITIAL  3.  Pt will increase bilateral grip strength by 15# and pinch strength by  3# to improve ability to grasp and hold pots and pans when cooking.   Goal status: INITIAL  4.  Pt will be educated on pain management techniques to improve independence and comfort during ADL tasks.   Goal status: INITIAL  5.  Pt will increase BUE shoulder A/ROM by 10 degrees to improve ability to reach back and brush her hair or scratch her back.   Goal status: INITIAL  6.  Pt will be educated on energy conservation strategies to improve activity tolerance required for functional task completion.   Goal status: INITIAL  ASSESSMENT:  CLINICAL IMPRESSION: Pt reports that at rest she has not had pain but more discomfort, when completing activities she has pain throughout whole body. She stated that she has been completing HEP with theraputty at home. Added in shoulder stretches and went over each stretch- gave handout. Ended session with a few minutes of manual to forearms- pt stated that she feels tight and painful here after theraputty exercises, she reports relief after manual.   PERFORMANCE DEFICITS: in functional skills including ADLs, IADLs, coordination, dexterity, edema, ROM, strength, pain, fascial restrictions, and UE functional use  IMPAIRMENTS: are limiting patient from ADLs, IADLs, rest and sleep, and leisure.   COMORBIDITIES: has no other co-morbidities that affects occupational performance. Patient will benefit from skilled OT to address above impairments and improve overall function.  MODIFICATION OR ASSISTANCE TO COMPLETE EVALUATION: No modification of tasks or assist necessary to complete an evaluation.  OT OCCUPATIONAL PROFILE AND HISTORY: Problem focused assessment: Including review of records relating to presenting problem.  CLINICAL DECISION MAKING: LOW -  limited treatment options, no task modification necessary  REHAB POTENTIAL: Fair dx  EVALUATION COMPLEXITY: Low      PLAN:  OT FREQUENCY: 1x/week   OT DURATION: 6 weeks  PLANNED INTERVENTIONS: therapeutic exercise, therapeutic activity, manual therapy, passive range of motion, electrical stimulation, patient/family education, and DME and/or AE instructions  RECOMMENDED OTHER SERVICES: Dietician  CONSULTED AND AGREED WITH PLAN OF CARE: Patient  PLAN FOR NEXT SESSION: Follow up on HEP, being shoulder stretches and add to HEP, grip and pinch strengthening   Lurena Joiner, OTR/L  [phone] 02/10/2023, 2:07 PM

## 2023-02-10 NOTE — Patient Instructions (Signed)

## 2023-02-15 ENCOUNTER — Encounter (HOSPITAL_COMMUNITY): Payer: Self-pay | Admitting: Occupational Therapy

## 2023-02-15 ENCOUNTER — Ambulatory Visit (HOSPITAL_COMMUNITY): Payer: 59 | Admitting: Occupational Therapy

## 2023-02-15 DIAGNOSIS — R29898 Other symptoms and signs involving the musculoskeletal system: Secondary | ICD-10-CM

## 2023-02-15 DIAGNOSIS — M79641 Pain in right hand: Secondary | ICD-10-CM

## 2023-02-15 DIAGNOSIS — M79642 Pain in left hand: Secondary | ICD-10-CM

## 2023-02-15 NOTE — Therapy (Signed)
OUTPATIENT OCCUPATIONAL THERAPY ORTHO TREATMENT  Patient Name: Erin Higgins MRN: [id number] DOB:[birth year], 52 y.o., female Today's Date: 02/15/2023    END OF SESSION:  OT End of Session - 02/15/23 1521     Visit Number 3    Number of Visits 6    Date for OT Re-Evaluation 03/13/23    Authorization Type [employer], $80 copay    Authorization Time Period no visit limit    OT Start Time 1353    OT Stop Time 1429    OT Time Calculation (min) 36 min    Activity Tolerance Patient tolerated treatment well    Behavior During Therapy WFL for tasks assessed/performed               Past Medical History:  Diagnosis Date   Heartburn    Herpes simplex of female genitalia    Past Surgical History:  Procedure Laterality Date   DILATION AND CURETTAGE OF UTERUS     TUBAL LIGATION  05/03/2012   Procedure: POST PARTUM TUBAL LIGATION;  Surgeon: Willodean Rosenthal, MD;  Location: [facility];  Service: Gynecology;  Laterality: Bilateral;  with filshie clips   Patient Active Problem List   Diagnosis Date Noted   Systemic sclerosis (HCC) 01/18/2023   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 Sheliah Hatch  ONSET DATE: approximately 1 year ago  REFERRING DIAG:  L94.3 (ICD-10-CM) - Sclerodactyly  M34.9 (ICD-10-CM) - Systemic sclerosis (HCC)    THERAPY DIAG:  Other symptoms and signs involving the musculoskeletal system  Pain in left hand  Pain in right hand  Rationale for Evaluation and Treatment: Rehabilitation  SUBJECTIVE:   SUBJECTIVE STATEMENT: S: That one exercise behind my back, I can tell is working on something.   PERTINENT HISTORY: Pt is a 52 y/o female presenting for OT evaluation with dx of scerlodactyly and systemic sclerosis. Pt reports symptoms  have been present for approximately 1 year. Pt with edema, skin dryness and hardening, and generalized pain and weakness.   PRECAUTIONS: Fall  WEIGHT BEARING RESTRICTIONS: No  PAIN:  Are you having pain? Yes: NPRS scale: 2/10 Pain location: hands and legs Pain description: aching, sore, tight Aggravating factors: cold weather Relieving factors: muscle relaxer, nothing helping hands  FALLS: Has patient fallen in last 6 months? Yes. Number of falls 1  PLOF: Independent  PATIENT GOALS: To improve independence and ability to complete tasks.   NEXT MD VISIT: 03/01/23  OBJECTIVE:   HAND DOMINANCE: Left  ADLs: Overall ADLs: Pt reports difficulty with getting in and out of the bed, reaching back to brush hair, reaching back to scratch her back and wash her back. Pt cannot lift pots and pans for cooking, tedious tasks are difficulty at times. Pt has difficulty sleeping due to pain.   UPPER EXTREMITY ROM:     Active ROM Right eval Left eval  Shoulder flexion 118 115  Shoulder abduction 112 101  Shoulder internal rotation 90 90  Shoulder external rotation 55 45  Wrist flexion 40 44  Wrist extension 38 45  Wrist ulnar deviation 28 15  Wrist radial deviation 12 18  Wrist pronation 90 90  Wrist supination 90 90  (Blank rows = not tested)  Active ROM Right eval Left eval  Thumb MCP (0-60) 55 52  Thumb IP (  0-80) 52 48  Index MCP (0-90) 60  58  Index PIP (0-100) 75  76  Index DIP (0-70)  30  20  Long MCP (0-90)  66  62  Long PIP (0-100)  90  68  Long DIP (0-70)  38  40  Ring MCP (0-90)  75  68  Ring PIP (0-100)  80  80  Ring DIP (0-70)  41  28  Little MCP (0-90)  76  44  Little PIP (0-100)  80  70  Little DIP (0-70)  50  30  (Blank rows = not tested)   UPPER EXTREMITY MMT:     MMT Right eval Left eval  Shoulder flexion 4+/5 4+/5  Shoulder abduction 4+/5 4+/5  Shoulder internal rotation 5/5 5/5  Shoulder external rotation 5/5 4+/5  Elbow flexion 5/5 5/5  Elbow  extension 4/5 4/5  Wrist flexion 5/5 5/5  Wrist extension 5/5 5/5  Wrist ulnar deviation 5/5 4/5  Wrist radial deviation 5/5 4/5  Wrist pronation 5/5 5/5  Wrist supination 5/5 5/5  (Blank rows = not tested)  HAND FUNCTION: Grip strength: Right: 28 lbs; Left: 26 lbs, Lateral pinch: Right: 9 lbs, Left: 9 lbs, and 3 point pinch: Right: 8 lbs, Left: 9 lbs  COORDINATION: 9 Hole Peg test: Right: 24.31 sec; Left: 24.90 sec  SENSATION: Tingling in hands with cold; numb feeling in left foot  EDEMA: generalized-mild  COGNITION: Overall cognitive status: Within functional limits for tasks assessed   TODAY'S TREATMENT:                                                                                                                              DATE:  02/15/23 -Shoulder stretches: BUE-flexion, doorway stretch, IR behind back with horizontal towel, 2x15" holds -Shoulder strengthening: standing, 1#-protraction, er/IR, 10 reps -A/ROM: standing-horizontal abduction, 10 reps -AA/ROM: standing-flexion, 10 reps -Scapular theraband: red-row, extension, retraction, 10 reps -Sponges: left-23 sponges; right-23 sponges -Hand gripper: left hand-gripper at 22#, all large and medium beads with gripper vertical; right hand-gripper at 22#, all large and medium beads with gripper vertical -Pinch task: pt using left hand, green clothespin and 3 point pinch to grasp and stack 3 towers of 5 sponges; removing and placing in bucket with right hand and 3 point pinch  02/10/2023 -P/ROM: supine; flexion, abduction, internal/external rotation 10x each  -AA/ROM: supine; flexion, abduction, internal/external rotation 10x each  -A/ROM: -Shoulder stretch: flexion, internal rotation, external rotation, corner stretch, posterior capsule stretch 15 second hold 2x each, BUE -Pinch: clip tree placing green clips on and off tree with BL hands    PATIENT EDUCATION: Education details: reviewed HEP Person educated: Patient  and Child(ren) Education method: Explanation, Demonstration, and Handouts Education comprehension: verbalized understanding and returned demonstration  HOME EXERCISE PROGRAM: Eval: red theraputty grip and pinch exercises.  8/16: shoulder stretches  GOALS: Goals reviewed with patient? Yes  SHORT TERM GOALS: Target date: 02/20/23  Pt will be  provided with and educated on HEP to maintain mobility required for independence in ADL completion.   Goal status: IN PROGRESS   LONG TERM GOALS: Target date: 03/13/23  Pt will be educated on AE and DME available for use during ADLs to maintain safety and independence during completion.   Goal status:  IN PROGRESS  2.  Pt will increase BUE strength to 5/5 to improve ability to perform lightweight lifting tasks during ADLs and housework completion.    Goal status:  IN PROGRESS  3.  Pt will increase bilateral grip strength by 15# and pinch strength by 3# to improve ability to grasp and hold pots and pans when cooking.   Goal status:  IN PROGRESS  4.  Pt will be educated on pain management techniques to improve independence and comfort during ADL tasks.   Goal status:  IN PROGRESS  5.  Pt will increase BUE shoulder A/ROM by 10 degrees to improve ability to reach back and brush her hair or scratch her back.   Goal status:  IN PROGRESS  6.  Pt will be educated on energy conservation strategies to improve activity tolerance required for functional task completion.   Goal status:  IN PROGRESS  ASSESSMENT:  CLINICAL IMPRESSION: Pt reports HEP is going well, some stretches are more difficult than others. Reviewed shoulder stretches today, completed shoulder strengthening, A/ROM, or AA/ROM dependent on pt's tolerance. Pt using dowel rod for flexion to achieve greater stretch and ROM. Added hand gripper and pinch activities today, pt with mild fatigue but good control of gripper and clothespins. Verbal cuing for form and technique.   PERFORMANCE  DEFICITS: in functional skills including ADLs, IADLs, coordination, dexterity, edema, ROM, strength, pain, fascial restrictions, and UE functional use    PLAN:  OT FREQUENCY: 1x/week   OT DURATION: 6 weeks  PLANNED INTERVENTIONS: therapeutic exercise, therapeutic activity, manual therapy, passive range of motion, electrical stimulation, patient/family education, and DME and/or AE instructions  RECOMMENDED OTHER SERVICES: Dietician  CONSULTED AND AGREED WITH PLAN OF CARE: Patient  PLAN FOR NEXT SESSION: Follow up on HEP, being shoulder stretches and add to HEP, grip and pinch strengthening   Ezra Sites, OTR/L  [phone] 02/15/2023, 3:22 PM
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
## 2023-02-16 ENCOUNTER — Ambulatory Visit: Payer: 59 | Attending: Internal Medicine

## 2023-02-16 DIAGNOSIS — M349 Systemic sclerosis, unspecified: Secondary | ICD-10-CM

## 2023-02-16 DIAGNOSIS — I088 Other rheumatic multiple valve diseases: Secondary | ICD-10-CM | POA: Diagnosis not present

## 2023-02-17 LAB — ECHOCARDIOGRAM COMPLETE
AR max vel: 2.33 cm2
AV Area VTI: 2.62 cm2
AV Area mean vel: 2.5 cm2
AV Mean grad: 6 mmHg
AV Peak grad: 13 mmHg
Ao pk vel: 1.8 m/s
Area-P 1/2: 3.63 cm2
Calc EF: 71.3 %
MV VTI: 2.98 cm2
S' Lateral: 2.6 cm
Single Plane A2C EF: 74.4 %
Single Plane A4C EF: 69 %

## 2023-02-20 ENCOUNTER — Ambulatory Visit (HOSPITAL_COMMUNITY): Payer: 59 | Admitting: Occupational Therapy

## 2023-02-20 ENCOUNTER — Encounter (HOSPITAL_COMMUNITY): Payer: Self-pay | Admitting: Occupational Therapy

## 2023-02-20 DIAGNOSIS — R29898 Other symptoms and signs involving the musculoskeletal system: Secondary | ICD-10-CM | POA: Diagnosis not present

## 2023-02-20 DIAGNOSIS — M79642 Pain in left hand: Secondary | ICD-10-CM

## 2023-02-20 DIAGNOSIS — M79641 Pain in right hand: Secondary | ICD-10-CM

## 2023-02-20 NOTE — Therapy (Signed)
OUTPATIENT OCCUPATIONAL THERAPY ORTHO TREATMENT  Patient Name: Erin Higgins MRN: [id number] DOB:April 02, 1971, 52 y.o., female Today's Date: 02/20/2023    END OF SESSION:  OT End of Session - 02/20/23 1419     Visit Number 4    Number of Visits 6    Date for OT Re-Evaluation 03/13/23    Authorization Type [employer], $80 copay    Authorization Time Period no visit limit    OT Start Time 1121    OT Stop Time 1159    OT Time Calculation (min) 38 min    Activity Tolerance Patient tolerated treatment well    Behavior During Therapy WFL for tasks assessed/performed             Past Medical History:  Diagnosis Date   Heartburn    Herpes simplex of female genitalia    Past Surgical History:  Procedure Laterality Date   DILATION AND CURETTAGE OF UTERUS     TUBAL LIGATION  05/03/2012   Procedure: POST PARTUM TUBAL LIGATION;  Surgeon: Willodean Rosenthal, MD;  Location: [facility];  Service: Gynecology;  Laterality: Bilateral;  with filshie clips   Patient Active Problem List   Diagnosis Date Noted   Systemic sclerosis (HCC) 01/18/2023   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 Sheliah Hatch  ONSET DATE: approximately 1 year ago  REFERRING DIAG:  L94.3 (ICD-10-CM) - Sclerodactyly  M34.9 (ICD-10-CM) - Systemic sclerosis (HCC)    THERAPY DIAG:  Other symptoms and signs involving the musculoskeletal system  Pain in left hand  Pain in right hand  Rationale for Evaluation and Treatment: Rehabilitation  SUBJECTIVE:   SUBJECTIVE STATEMENT: S: I am very very sore today.   PERTINENT HISTORY: Pt is a 52 y/o female presenting for OT evaluation with dx of scerlodactyly and systemic sclerosis. Pt reports symptoms have been present for approximately 1 year. Pt  with edema, skin dryness and hardening, and generalized pain and weakness.   PRECAUTIONS: Fall  WEIGHT BEARING RESTRICTIONS: No  PAIN:  Are you having pain? Yes: NPRS scale: 11/10 Pain location: hands and legs Pain description: aching, sore, tight Aggravating factors: cold weather Relieving factors: muscle relaxer, nothing helping hands  FALLS: Has patient fallen in last 6 months? Yes. Number of falls 1  PLOF: Independent  PATIENT GOALS: To improve independence and ability to complete tasks.   NEXT MD VISIT: 03/01/23  OBJECTIVE:   HAND DOMINANCE: Left  ADLs: Overall ADLs: Pt reports difficulty with getting in and out of the bed, reaching back to brush hair, reaching back to scratch her back and wash her back. Pt cannot lift pots and pans for cooking, tedious tasks are difficulty at times. Pt has difficulty sleeping due to pain.   UPPER EXTREMITY ROM:     Active ROM Right eval Left eval  Shoulder flexion 118 115  Shoulder abduction 112 101  Shoulder internal rotation 90 90  Shoulder external rotation 55 45  Wrist flexion 40 44  Wrist extension 38 45  Wrist ulnar deviation 28 15  Wrist radial deviation 12 18  Wrist pronation 90 90  Wrist supination 90 90  (Blank rows = not tested)  Active ROM Right eval Left eval  Thumb MCP (0-60) 55 52  Thumb IP (0-80) 52 48  Index MCP (0-90) 60  58  Index PIP (0-100) 75  76  Index DIP (0-70)  30  20  Long MCP (0-90)  66  62  Long PIP (0-100)  90  68  Long DIP (0-70)  38  40  Ring MCP (0-90)  75  68  Ring PIP (0-100)  80  80  Ring DIP (0-70)  41  28  Little MCP (0-90)  76  44  Little PIP (0-100)  80  70  Little DIP (0-70)  50  30  (Blank rows = not tested)   UPPER EXTREMITY MMT:     MMT Right eval Left eval  Shoulder flexion 4+/5 4+/5  Shoulder abduction 4+/5 4+/5  Shoulder internal rotation 5/5 5/5  Shoulder external rotation 5/5 4+/5  Elbow flexion 5/5 5/5  Elbow extension 4/5 4/5  Wrist flexion 5/5 5/5   Wrist extension 5/5 5/5  Wrist ulnar deviation 5/5 4/5  Wrist radial deviation 5/5 4/5  Wrist pronation 5/5 5/5  Wrist supination 5/5 5/5  (Blank rows = not tested)  HAND FUNCTION: Grip strength: Right: 28 lbs; Left: 26 lbs, Lateral pinch: Right: 9 lbs, Left: 9 lbs, and 3 point pinch: Right: 8 lbs, Left: 9 lbs  COORDINATION: 9 Hole Peg test: Right: 24.31 sec; Left: 24.90 sec  SENSATION: Tingling in hands with cold; numb feeling in left foot  EDEMA: generalized-mild  COGNITION: Overall cognitive status: Within functional limits for tasks assessed   TODAY'S TREATMENT:                                                                                                                              DATE:   02/20/23 -AA/ROM: 2lb dowel, flexion, abduction, protraction, horizontal abduction, er/IR, x10 BUE -A/ROM: sitting, flexion, abduction, protraction, horizontal abduction, er/IR, x10 BUE -UBE: level 1 3' forward  02/15/23 -Shoulder stretches: BUE-flexion, doorway stretch, IR behind back with horizontal towel, 2x15" holds -Shoulder strengthening: standing, 1#-protraction, er/IR, 10 reps -A/ROM: standing-horizontal abduction, 10 reps -AA/ROM: standing-flexion, 10 reps -Scapular theraband: red-row, extension, retraction, 10 reps -Sponges: left-23 sponges; right-23 sponges -Hand gripper: left hand-gripper at 22#, all large and medium beads with gripper vertical; right hand-gripper at 22#, all large and medium beads with gripper vertical -Pinch task: pt using left hand, green clothespin and 3 point pinch to grasp and stack 3 towers of 5 sponges; removing and placing in bucket with right hand and 3 point pinch  02/10/2023 -P/ROM: supine; flexion, abduction, internal/external rotation 10x each  -AA/ROM: supine; flexion, abduction, internal/external rotation 10x each  -A/ROM: -Shoulder stretch: flexion, internal rotation, external rotation, corner stretch, posterior capsule stretch 15  second hold 2x each, BUE -Pinch: clip tree placing green clips on and off tree with BL hands    PATIENT EDUCATION: Education details:Shoulder A/ROM Person educated: Patient and Child(ren) Education method: Programmer, multimedia, Demonstration, and Handouts Education comprehension: verbalized understanding and returned demonstration  HOME EXERCISE PROGRAM: Eval: red theraputty grip and pinch exercises.  8/16: shoulder  stretches 8/26: Shoulder A/ROm  GOALS: Goals reviewed with patient? Yes  SHORT TERM GOALS: Target date: 02/20/23  Pt will be provided with and educated on HEP to maintain mobility required for independence in ADL completion.   Goal status: IN PROGRESS   LONG TERM GOALS: Target date: 03/13/23  Pt will be educated on AE and DME available for use during ADLs to maintain safety and independence during completion.   Goal status:  IN PROGRESS  2.  Pt will increase BUE strength to 5/5 to improve ability to perform lightweight lifting tasks during ADLs and housework completion.    Goal status:  IN PROGRESS  3.  Pt will increase bilateral grip strength by 15# and pinch strength by 3# to improve ability to grasp and hold pots and pans when cooking.   Goal status:  IN PROGRESS  4.  Pt will be educated on pain management techniques to improve independence and comfort during ADL tasks.   Goal status:  IN PROGRESS  5.  Pt will increase BUE shoulder A/ROM by 10 degrees to improve ability to reach back and brush her hair or scratch her back.   Goal status:  IN PROGRESS  6.  Pt will be educated on energy conservation strategies to improve activity tolerance required for functional task completion.   Goal status:  IN PROGRESS  ASSESSMENT:  CLINICAL IMPRESSION: This session pt working on overall shoulder mobility, where she has continued discomfort and weakness in her LUE. Pt having increased pain and discomfort throughout session, requiring increased time and rest breaks. Due to  low tolerance and fatigue, pt only making it through ROM tasks. OT providing verbal and visual cuing for positioning and technique, as well as education on activity tolerance following painful and fatiguing episodes.   PERFORMANCE DEFICITS: in functional skills including ADLs, IADLs, coordination, dexterity, edema, ROM, strength, pain, fascial restrictions, and UE functional use    PLAN:  OT FREQUENCY: 1x/week   OT DURATION: 6 weeks  PLANNED INTERVENTIONS: therapeutic exercise, therapeutic activity, manual therapy, passive range of motion, electrical stimulation, patient/family education, and DME and/or AE instructions  RECOMMENDED OTHER SERVICES: Dietician  CONSULTED AND AGREED WITH PLAN OF CARE: Patient  PLAN FOR NEXT SESSION: Follow up on HEP, being shoulder stretches and add to HEP, grip and pinch strengthening   Trish Mage, OTR/L [phone] 02/20/2023, 2:20 PM

## 2023-03-01 ENCOUNTER — Encounter: Payer: Self-pay | Admitting: Internal Medicine

## 2023-03-01 ENCOUNTER — Ambulatory Visit: Payer: 59 | Attending: Internal Medicine | Admitting: Internal Medicine

## 2023-03-01 ENCOUNTER — Ambulatory Visit (HOSPITAL_COMMUNITY): Payer: 59 | Admitting: Occupational Therapy

## 2023-03-01 ENCOUNTER — Encounter (HOSPITAL_COMMUNITY): Payer: Self-pay | Admitting: Occupational Therapy

## 2023-03-01 VITALS — BP 95/61 | HR 89 | Resp 14 | Ht 64.0 in | Wt 125.0 lb

## 2023-03-01 DIAGNOSIS — R634 Abnormal weight loss: Secondary | ICD-10-CM | POA: Insufficient documentation

## 2023-03-01 DIAGNOSIS — M79642 Pain in left hand: Secondary | ICD-10-CM | POA: Diagnosis present

## 2023-03-01 DIAGNOSIS — M349 Systemic sclerosis, unspecified: Secondary | ICD-10-CM | POA: Diagnosis present

## 2023-03-01 DIAGNOSIS — R29898 Other symptoms and signs involving the musculoskeletal system: Secondary | ICD-10-CM | POA: Insufficient documentation

## 2023-03-01 DIAGNOSIS — I7300 Raynaud's syndrome without gangrene: Secondary | ICD-10-CM | POA: Diagnosis present

## 2023-03-01 DIAGNOSIS — M79641 Pain in right hand: Secondary | ICD-10-CM | POA: Insufficient documentation

## 2023-03-01 DIAGNOSIS — L943 Sclerodactyly: Secondary | ICD-10-CM | POA: Diagnosis present

## 2023-03-01 DIAGNOSIS — Z79899 Other long term (current) drug therapy: Secondary | ICD-10-CM | POA: Diagnosis present

## 2023-03-01 DIAGNOSIS — M25552 Pain in left hip: Secondary | ICD-10-CM | POA: Insufficient documentation

## 2023-03-01 MED ORDER — FOLIC ACID 1 MG PO TABS
1.0000 mg | ORAL_TABLET | Freq: Every day | ORAL | 3 refills | Status: DC
Start: 2023-04-18 — End: 2023-09-21

## 2023-03-01 MED ORDER — CYCLOBENZAPRINE HCL 5 MG PO TABS
5.0000 mg | ORAL_TABLET | Freq: Every evening | ORAL | 0 refills | Status: DC | PRN
Start: 2023-03-01 — End: 2023-12-22

## 2023-03-01 NOTE — Therapy (Signed)
OUTPATIENT OCCUPATIONAL THERAPY ORTHO TREATMENT  Patient Name: Erin Higgins MRN: [id number] DOB:03-May-1971, 52 y.o., female Today's Date: 03/01/2023    END OF SESSION:  OT End of Session - 03/01/23 1423     Visit Number 5    Number of Visits 6    Date for OT Re-Evaluation 03/13/23    Authorization Type [employer], $80 copay    Authorization Time Period no visit limit    OT Start Time 1348    OT Stop Time 1428    OT Time Calculation (min) 40 min    Activity Tolerance Patient tolerated treatment well    Behavior During Therapy WFL for tasks assessed/performed              Past Medical History:  Diagnosis Date   Heartburn    Herpes simplex of female genitalia    Past Surgical History:  Procedure Laterality Date   DILATION AND CURETTAGE OF UTERUS     TUBAL LIGATION  05/03/2012   Procedure: POST PARTUM TUBAL LIGATION;  Surgeon: Willodean Rosenthal, MD;  Location: [facility];  Service: Gynecology;  Laterality: Bilateral;  with filshie clips   Patient Active Problem List   Diagnosis Date Noted   Lateral pain of left hip 03/01/2023   Unintentional weight loss of 10% body weight within 6 months 03/01/2023   Systemic sclerosis (HCC) 01/18/2023   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 Sheliah Hatch  ONSET DATE: approximately 1 year ago  REFERRING DIAG:  L94.3 (ICD-10-CM) - Sclerodactyly  M34.9 (ICD-10-CM) - Systemic sclerosis (HCC)    THERAPY DIAG:  Other symptoms and signs involving the musculoskeletal system  Pain in left hand  Pain in right hand  Rationale for Evaluation and Treatment: Rehabilitation  SUBJECTIVE:   SUBJECTIVE STATEMENT: S: I haven't done any exercises because I have been so fatigued.    PERTINENT HISTORY: Pt is a 52 y/o  female presenting for OT evaluation with dx of scerlodactyly and systemic sclerosis. Pt reports symptoms have been present for approximately 1 year. Pt with edema, skin dryness and hardening, and generalized pain and weakness.   PRECAUTIONS: Fall  WEIGHT BEARING RESTRICTIONS: No  PAIN:  Are you having pain? Yes: NPRS scale: 11/10 Pain location: hands and legs Pain description: aching, sore, tight Aggravating factors: cold weather Relieving factors: muscle relaxer, nothing helping hands  FALLS: Has patient fallen in last 6 months? Yes. Number of falls 1  PLOF: Independent  PATIENT GOALS: To improve independence and ability to complete tasks.   NEXT MD VISIT: 05/31/23  OBJECTIVE:   HAND DOMINANCE: Left  ADLs: Overall ADLs: Pt reports difficulty with getting in and out of the bed, reaching back to brush hair, reaching back to scratch her back and wash her back. Pt cannot lift pots and pans for cooking, tedious tasks are difficulty at times. Pt has difficulty sleeping due to pain.   UPPER EXTREMITY ROM:     Active ROM Right eval Left eval  Shoulder flexion 118 115  Shoulder abduction 112 101  Shoulder internal rotation 90 90  Shoulder external rotation 55 45  Wrist flexion 40 44  Wrist extension 38 45  Wrist ulnar deviation 28 15  Wrist radial deviation 12 18  Wrist pronation 90 90  Wrist supination 90 90  (Blank rows =  not tested)  Active ROM Right eval Left eval  Thumb MCP (0-60) 55 52  Thumb IP (0-80) 52 48  Index MCP (0-90) 60  58  Index PIP (0-100) 75  76  Index DIP (0-70)  30  20  Long MCP (0-90)  66  62  Long PIP (0-100)  90  68  Long DIP (0-70)  38  40  Ring MCP (0-90)  75  68  Ring PIP (0-100)  80  80  Ring DIP (0-70)  41  28  Little MCP (0-90)  76  44  Little PIP (0-100)  80  70  Little DIP (0-70)  50  30  (Blank rows = not tested)   UPPER EXTREMITY MMT:     MMT Right eval Left eval  Shoulder flexion 4+/5 4+/5  Shoulder abduction 4+/5 4+/5   Shoulder internal rotation 5/5 5/5  Shoulder external rotation 5/5 4+/5  Elbow flexion 5/5 5/5  Elbow extension 4/5 4/5  Wrist flexion 5/5 5/5  Wrist extension 5/5 5/5  Wrist ulnar deviation 5/5 4/5  Wrist radial deviation 5/5 4/5  Wrist pronation 5/5 5/5  Wrist supination 5/5 5/5  (Blank rows = not tested)  HAND FUNCTION: Grip strength: Right: 28 lbs; Left: 26 lbs, Lateral pinch: Right: 9 lbs, Left: 9 lbs, and 3 point pinch: Right: 8 lbs, Left: 9 lbs  COORDINATION: 9 Hole Peg test: Right: 24.31 sec; Left: 24.90 sec  SENSATION: Tingling in hands with cold; numb feeling in left foot  EDEMA: generalized-mild   TODAY'S TREATMENT:                                                                                                                              DATE:  03/01/23 -Manual techniques: myofascial release to bilateral upper arms, anterior shoulders, trapezius regions to decrease pain and fascial restrictions and increase joint ROM -P/ROM: supine-bilateral shoulders-flexion, abduction, er/IR, horizontal abduction, 5 reps -A/ROM: supine-bilateral shoulders-protraction, flexion, abduction, er/IR, horizontal abduction, 10 reps -Scapular theraband: red-row, extension, retraction, 10 reps -Hand gripper: left hand-gripper at 22#, all large and medium beads with gripper vertical; right hand-gripper at 25#, all large and medium beads with gripper vertical -Pulleys: 1' flexion BUE  02/20/23 -AA/ROM: 2lb dowel, flexion, abduction, protraction, horizontal abduction, er/IR, x10 BUE -A/ROM: sitting, flexion, abduction, protraction, horizontal abduction, er/IR, x10 BUE -UBE: level 1 3' forward  02/15/23 -Shoulder stretches: BUE-flexion, doorway stretch, IR behind back with horizontal towel, 2x15" holds -Shoulder strengthening: standing, 1#-protraction, er/IR, 10 reps -A/ROM: standing-horizontal abduction, 10 reps -AA/ROM: standing-flexion, 10 reps -Scapular theraband: red-row, extension,  retraction, 10 reps -Sponges: left-23 sponges; right-23 sponges -Hand gripper: left hand-gripper at 22#, all large and medium beads with gripper vertical; right hand-gripper at 22#, all large and medium beads with gripper vertical -Pinch task: pt using left hand, green clothespin and 3 point pinch to grasp and stack 3 towers of 5 sponges; removing and placing in bucket with right hand and  3 point pinch     PATIENT EDUCATION: Education details: reviewed HEP Person educated: Patient and Child(ren) Education method: Explanation, Demonstration, and Handouts Education comprehension: verbalized understanding and returned demonstration  HOME EXERCISE PROGRAM: Eval: red theraputty grip and pinch exercises.  8/16: shoulder stretches 8/26: Shoulder A/ROm  GOALS: Goals reviewed with patient? Yes  SHORT TERM GOALS: Target date: 02/20/23  Pt will be provided with and educated on HEP to maintain mobility required for independence in ADL completion.   Goal status: IN PROGRESS   LONG TERM GOALS: Target date: 03/13/23  Pt will be educated on AE and DME available for use during ADLs to maintain safety and independence during completion.   Goal status:  IN PROGRESS  2.  Pt will increase BUE strength to 5/5 to improve ability to perform lightweight lifting tasks during ADLs and housework completion.    Goal status:  IN PROGRESS  3.  Pt will increase bilateral grip strength by 15# and pinch strength by 3# to improve ability to grasp and hold pots and pans when cooking.   Goal status:  IN PROGRESS  4.  Pt will be educated on pain management techniques to improve independence and comfort during ADL tasks.   Goal status:  IN PROGRESS  5.  Pt will increase BUE shoulder A/ROM by 10 degrees to improve ability to reach back and brush her hair or scratch her back.   Goal status:  IN PROGRESS  6.  Pt will be educated on energy conservation strategies to improve activity tolerance required for  functional task completion.   Goal status:  IN PROGRESS  ASSESSMENT:  CLINICAL IMPRESSION: Pt reports she has been very fatigued lately, has tightness throughout her body. Manual therapy completed to bilateral shoulders today, pt with increased tightness limiting full ROM during passive stretching. A/ROM completed in supine to reduce fatigue, pt did well, no rest breaks needed during tasks. Continued with hand gripper activity, increased resistance for right hand today. Verbal cuing for form and technique.   PERFORMANCE DEFICITS: in functional skills including ADLs, IADLs, coordination, dexterity, edema, ROM, strength, pain, fascial restrictions, and UE functional use    PLAN:  OT FREQUENCY: 1x/week   OT DURATION: 6 weeks  PLANNED INTERVENTIONS: therapeutic exercise, therapeutic activity, manual therapy, passive range of motion, electrical stimulation, patient/family education, and DME and/or AE instructions  RECOMMENDED OTHER SERVICES: Dietician  CONSULTED AND AGREED WITH PLAN OF CARE: Patient  PLAN FOR NEXT SESSION: Follow up on HEP, being shoulder stretches and add to HEP, grip and pinch strengthening   Ezra Sites, OTR/L  [phone] 03/01/2023, 2:28 PM

## 2023-03-02 LAB — COMPLETE METABOLIC PANEL WITH GFR
AG Ratio: 1.1 (calc) (ref 1.0–2.5)
ALT: 11 U/L (ref 6–29)
AST: 18 U/L (ref 10–35)
Albumin: 3.8 g/dL (ref 3.6–5.1)
Alkaline phosphatase (APISO): 43 U/L (ref 37–153)
BUN: 16 mg/dL (ref 7–25)
CO2: 26 mmol/L (ref 20–32)
Calcium: 9.7 mg/dL (ref 8.6–10.4)
Chloride: 103 mmol/L (ref 98–110)
Creat: 0.54 mg/dL (ref 0.50–1.03)
Globulin: 3.6 g/dL (ref 1.9–3.7)
Glucose, Bld: 92 mg/dL (ref 65–99)
Potassium: 4.4 mmol/L (ref 3.5–5.3)
Sodium: 138 mmol/L (ref 135–146)
Total Bilirubin: 0.6 mg/dL (ref 0.2–1.2)
Total Protein: 7.4 g/dL (ref 6.1–8.1)
eGFR: 112 mL/min/{1.73_m2} (ref >=60)

## 2023-03-02 LAB — SEDIMENTATION RATE: Sed Rate: 29 mm/h — ABNORMAL HIGH (ref 0–20)

## 2023-03-02 LAB — CBC WITH DIFFERENTIAL/PLATELET
Absolute Monocytes: 671 {cells}/uL (ref 200–950)
Basophils Absolute: 18 {cells}/uL (ref 0–200)
Basophils Relative: 0.3 %
Eosinophils Absolute: 43 {cells}/uL (ref 15–500)
Eosinophils Relative: 0.7 %
HCT: 36.4 % (ref 35.0–45.0)
Hemoglobin: 11.7 g/dL (ref 11.7–15.5)
Lymphs Abs: 1714 {cells}/uL (ref 850–3900)
MCH: 26.8 pg — ABNORMAL LOW (ref 27.0–33.0)
MCHC: 32.1 g/dL (ref 32.0–36.0)
MCV: 83.3 fL (ref 80.0–100.0)
MPV: 10.7 fL (ref 7.5–12.5)
Monocytes Relative: 11 %
Neutro Abs: 3654 {cells}/uL (ref 1500–7800)
Neutrophils Relative %: 59.9 %
Platelets: 330 10*3/uL (ref 140–400)
RBC: 4.37 10*6/uL (ref 3.80–5.10)
RDW: 15.1 % — ABNORMAL HIGH (ref 11.0–15.0)
Total Lymphocyte: 28.1 %
WBC: 6.1 10*3/uL (ref 3.8–10.8)

## 2023-03-02 LAB — C-REACTIVE PROTEIN: CRP: 5.4 mg/L (ref <8.0)

## 2023-03-02 NOTE — Progress Notes (Signed)
Sedimentation rate is increased at 29 up form 17 but CRP improved to 5.4 from 16. I am not sure but this probably reflects a decrease in inflammation activity. There is no problem with blood count, kidney, or liver function from methotrexate so that is fine to continue.

## 2023-03-06 ENCOUNTER — Encounter (HOSPITAL_COMMUNITY): Payer: 59 | Admitting: Occupational Therapy

## 2023-03-13 ENCOUNTER — Encounter (HOSPITAL_COMMUNITY): Payer: Self-pay | Admitting: Occupational Therapy

## 2023-03-13 ENCOUNTER — Ambulatory Visit (HOSPITAL_COMMUNITY): Payer: 59 | Admitting: Occupational Therapy

## 2023-03-13 ENCOUNTER — Encounter: Payer: Self-pay | Admitting: Gastroenterology

## 2023-03-13 DIAGNOSIS — M79641 Pain in right hand: Secondary | ICD-10-CM

## 2023-03-13 DIAGNOSIS — R29898 Other symptoms and signs involving the musculoskeletal system: Secondary | ICD-10-CM

## 2023-03-13 DIAGNOSIS — M349 Systemic sclerosis, unspecified: Secondary | ICD-10-CM | POA: Diagnosis not present

## 2023-03-13 DIAGNOSIS — M79642 Pain in left hand: Secondary | ICD-10-CM

## 2023-03-13 NOTE — Therapy (Signed)
OUTPATIENT OCCUPATIONAL THERAPY ORTHO TREATMENT REASSESSMENT AND TREATMENT  Patient Name: Erin Higgins MRN: [id number] DOB:09/01/1970, 52 y.o., female Today's Date: 03/13/2023    END OF SESSION:  OT End of Session - 03/13/23 1442     Visit Number 6    Number of Visits 10    Date for OT Re-Evaluation 04/12/23    Authorization Type [employer], $80 copay    Authorization Time Period no visit limit    OT Start Time 1348    OT Stop Time 1431    OT Time Calculation (min) 43 min    Activity Tolerance Patient tolerated treatment well    Behavior During Therapy WFL for tasks assessed/performed               Past Medical History:  Diagnosis Date   Heartburn    Herpes simplex of female genitalia    Past Surgical History:  Procedure Laterality Date   DILATION AND CURETTAGE OF UTERUS     TUBAL LIGATION  05/03/2012   Procedure: POST PARTUM TUBAL LIGATION;  Surgeon: Willodean Rosenthal, MD;  Location: [facility];  Service: Gynecology;  Laterality: Bilateral;  with filshie clips   Patient Active Problem List   Diagnosis Date Noted   Lateral pain of left hip 03/01/2023   Unintentional weight loss of 10% body weight within 6 months 03/01/2023   Systemic sclerosis (HCC) 01/18/2023   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 Sheliah Hatch  ONSET DATE: approximately 1 year ago  REFERRING DIAG:  L94.3 (ICD-10-CM) - Sclerodactyly  M34.9 (ICD-10-CM) - Systemic sclerosis (HCC)    THERAPY DIAG:  Other symptoms and signs involving the musculoskeletal system  Pain in left hand  Pain in right hand  Rationale for Evaluation and Treatment: Rehabilitation  SUBJECTIVE:   SUBJECTIVE STATEMENT: S: I can almost reach to the top of the refrigerator.    PERTINENT  HISTORY: Pt is a 51 y/o female presenting for OT evaluation with dx of scerlodactyly and systemic sclerosis. Pt reports symptoms have been present for approximately 1 year. Pt with edema, skin dryness and hardening, and generalized pain and weakness.   PRECAUTIONS: Fall  WEIGHT BEARING RESTRICTIONS: No  PAIN:  Are you having pain? No  FALLS: Has patient fallen in last 6 months? Yes. Number of falls 1  PLOF: Independent  PATIENT GOALS: To improve independence and ability to complete tasks.   NEXT MD VISIT: 05/31/23  OBJECTIVE:   HAND DOMINANCE: Left  ADLs: Overall ADLs: Pt reports difficulty with getting in and out of the bed, reaching back to brush hair, reaching back to scratch her back and wash her back. Pt cannot lift pots and pans for cooking, tedious tasks are difficulty at times. Pt has difficulty sleeping due to pain.   UPPER EXTREMITY ROM:     Active ROM Right eval Left eval Right 03/13/23 Left 03/13/23  Shoulder flexion 118 115 130 122  Shoulder abduction 112 101 130 129  Shoulder internal rotation 90 90 90 90  Shoulder external rotation 55 45 58 50  Wrist flexion 40 44 48 50  Wrist extension 38 45 60 50  Wrist ulnar deviation 28 15 28 25   Wrist radial deviation 12 18 28 20   Wrist pronation 90 90    Wrist supination 90 90    (  Blank rows = not tested)  Active ROM Right eval Left eval Right 03/13/23 Left 03/13/23  Thumb MCP (0-60) 55 52 62 64  Thumb IP (0-80) 52 48 62 52  Index MCP (0-90) 60  58 60 58  Index PIP (0-100) 75  76 76 80  Index DIP (0-70)  30  20 35 40  Long MCP (0-90)  66  62 62 56  Long PIP (0-100)  90  68 76 80  Long DIP (0-70)  38  40 32 50  Ring MCP (0-90)  75  68 66 54  Ring PIP (0-100)  80  80 88 88  Ring DIP (0-70)  41  28 50 50  Little MCP (0-90)  76  44 72 72  Little PIP (0-100)  80  70 82 86  Little DIP (0-70)  50  30 58 52  (Blank rows = not tested)   UPPER EXTREMITY MMT:     MMT Right eval Left eval Right 03/13/23  Left 03/13/23  Shoulder flexion 4+/5 4+/5 4+/5 4+/5  Shoulder abduction 4+/5 4+/5 4+/5 4+/5  Shoulder internal rotation 5/5 5/5 5/5 5/5  Shoulder external rotation 5/5 4+/5 5/5 4+/5  Elbow flexion 5/5 5/5 5/5 5/5  Elbow extension 4/5 4/5 4+/5 4+/5  Wrist flexion 5/5 5/5 5/5 5/5  Wrist extension 5/5 5/5 5/5 5/5  Wrist ulnar deviation 5/5 4/5 5/5 5/5  Wrist radial deviation 5/5 4/5 5/5 5/5  Wrist pronation 5/5 5/5 5/5 5/5  Wrist supination 5/5 5/5 5/5 5/5  (Blank rows = not tested)  HAND FUNCTION: Grip strength: Right: 28 lbs; Left: 26 lbs, Lateral pinch: Right: 9 lbs, Left: 9 lbs, and 3 point pinch: Right: 8 lbs, Left: 9 lbs 03/13/23: Grip strength: Right: 29 lbs; Left: 28 lbs, Lateral pinch: Right: 10 lbs, Left: 9 lbs, and 3 point pinch: Right: 8 lbs, Left: 8 lbs  COORDINATION: 9 Hole Peg test: Right: 24.31 sec; Left: 24.90 sec  SENSATION: Tingling in hands with cold; numb feeling in left foot  EDEMA: generalized-mild   TODAY'S TREATMENT:                                                                                                                              DATE:  03/13/23 -Paraffin: 10' bilateral hands -Discussed pt progress, goals, and updated plan of care.  -Energy conservation: educated on energy conservation techniques for ADLs, housework, and meal preparation. Educated on the 6 Ps and provided handout for strategies.   03/01/23 -Manual techniques: myofascial release to bilateral upper arms, anterior shoulders, trapezius regions to decrease pain and fascial restrictions and increase joint ROM -P/ROM: supine-bilateral shoulders-flexion, abduction, er/IR, horizontal abduction, 5 reps -A/ROM: supine-bilateral shoulders-protraction, flexion, abduction, er/IR, horizontal abduction, 10 reps -Scapular theraband: red-row, extension, retraction, 10 reps -Hand gripper: left hand-gripper at 22#, all large and medium beads with gripper vertical; right hand-gripper at 25#, all large  and medium beads with gripper vertical -Pulleys:  1' flexion BUE  02/20/23 -AA/ROM: 2lb dowel, flexion, abduction, protraction, horizontal abduction, er/IR, x10 BUE -A/ROM: sitting, flexion, abduction, protraction, horizontal abduction, er/IR, x10 BUE -UBE: level 1 3' forward    PATIENT EDUCATION: Education details: energy conservation Person educated: Patient and Child(ren) Education method: Explanation, Demonstration, and Handouts Education comprehension: verbalized understanding and returned demonstration  HOME EXERCISE PROGRAM: Eval: red theraputty grip and pinch exercises.  8/16: shoulder stretches 8/26: Shoulder A/ROM 9/16: energy conservation  GOALS: Goals reviewed with patient? Yes  SHORT TERM GOALS: Target date: 02/20/23  Pt will be provided with and educated on HEP to maintain mobility required for independence in ADL completion.   Goal status: MET   LONG TERM GOALS: Target date: 03/13/23  Pt will be educated on AE and DME available for use during ADLs to maintain safety and independence during completion.   Goal status:  IN PROGRESS  2.  Pt will increase BUE strength to 5/5 to improve ability to perform lightweight lifting tasks during ADLs and housework completion.    Goal status:  IN PROGRESS  3.  Pt will increase bilateral grip strength by 15# and pinch strength by 3# to improve ability to grasp and hold pots and pans when cooking.   Goal status:  IN PROGRESS  4.  Pt will be educated on pain management techniques to improve independence and comfort during ADL tasks.   Goal status:  IN PROGRESS  5.  Pt will increase BUE shoulder A/ROM by 10 degrees to improve ability to reach back and brush her hair or scratch her back.   Goal status:  MET  6.  Pt will be educated on energy conservation strategies to improve activity tolerance required for functional task completion.   Goal status:  MET  ASSESSMENT:  CLINICAL IMPRESSION: Reassessment completed  this session, pt has met STG and 2/6 LTGs thus far. Pt reports she is now able to reach to the top of her refrigerator with greater ease. She had a cell regeneration technique completed and has felt a lot better since last Wednesday. Pt is most concerned about her hands, does not want to lose function in her hands. Pt is making progress towards improved grip, however sessions mostly focusing on shoulders thus far. Pt trialing paraffin today, reports her hands feel really good afterwards. Educated on energy conservation strategies today. Recommend continued skilled OT services to focus on function of bilateral hands and continue progressing towards those goals.   PERFORMANCE DEFICITS: in functional skills including ADLs, IADLs, coordination, dexterity, edema, ROM, strength, pain, fascial restrictions, and UE functional use    PLAN:  OT FREQUENCY: 2x/week   OT DURATION: 4 weeks  PLANNED INTERVENTIONS: therapeutic exercise, therapeutic activity, manual therapy, passive range of motion, electrical stimulation, patient/family education, and DME and/or AE instructions  RECOMMENDED OTHER SERVICES: Dietician  CONSULTED AND AGREED WITH PLAN OF CARE: Patient  PLAN FOR NEXT SESSION: Follow up on HEP, target hand ROM, grip and pinch, paraffin at beginning of session   Ezra Sites, OTR/L  [phone] 03/13/2023, 2:43 PM

## 2023-03-22 ENCOUNTER — Ambulatory Visit (HOSPITAL_COMMUNITY): Payer: 59 | Admitting: Occupational Therapy

## 2023-03-22 ENCOUNTER — Encounter (HOSPITAL_COMMUNITY): Payer: Self-pay | Admitting: Occupational Therapy

## 2023-03-22 DIAGNOSIS — M79641 Pain in right hand: Secondary | ICD-10-CM

## 2023-03-22 DIAGNOSIS — M349 Systemic sclerosis, unspecified: Secondary | ICD-10-CM | POA: Diagnosis not present

## 2023-03-22 DIAGNOSIS — R29898 Other symptoms and signs involving the musculoskeletal system: Secondary | ICD-10-CM

## 2023-03-22 DIAGNOSIS — M79642 Pain in left hand: Secondary | ICD-10-CM

## 2023-03-22 NOTE — Therapy (Signed)
OUTPATIENT OCCUPATIONAL THERAPY ORTHO TREATMENT   Patient Name: Erin Higgins MRN: [id number] DOB:03-22-1971, 52 y.o., female Today's Date: 03/22/2023    END OF SESSION:  OT End of Session - 03/22/23 1424     Visit Number 7    Number of Visits 10    Date for OT Re-Evaluation 04/12/23    Authorization Type [employer], $80 copay    Authorization Time Period no visit limit    OT Start Time 1346    OT Stop Time 1425    OT Time Calculation (min) 39 min    Activity Tolerance Patient tolerated treatment well    Behavior During Therapy WFL for tasks assessed/performed                Past Medical History:  Diagnosis Date   Heartburn    Herpes simplex of female genitalia    Past Surgical History:  Procedure Laterality Date   DILATION AND CURETTAGE OF UTERUS     TUBAL LIGATION  05/03/2012   Procedure: POST PARTUM TUBAL LIGATION;  Surgeon: Willodean Rosenthal, MD;  Location: [facility];  Service: Gynecology;  Laterality: Bilateral;  with filshie clips   Patient Active Problem List   Diagnosis Date Noted   Lateral pain of left hip 03/01/2023   Unintentional weight loss of 10% body weight within 6 months 03/01/2023   Systemic sclerosis (HCC) 01/18/2023   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 Sheliah Hatch  ONSET DATE: approximately 1 year ago  REFERRING DIAG:  L94.3 (ICD-10-CM) - Sclerodactyly  M34.9 (ICD-10-CM) - Systemic sclerosis (HCC)    THERAPY DIAG:  Other symptoms and signs involving the musculoskeletal system  Pain in left hand  Pain in right hand  Rationale for Evaluation and Treatment: Rehabilitation  SUBJECTIVE:   SUBJECTIVE STATEMENT: S: I've been using those tips from last time.    PERTINENT HISTORY: Pt is a 52 y/o female  presenting for OT evaluation with dx of scerlodactyly and systemic sclerosis. Pt reports symptoms have been present for approximately 1 year. Pt with edema, skin dryness and hardening, and generalized pain and weakness.   PRECAUTIONS: Fall  WEIGHT BEARING RESTRICTIONS: No  PAIN:  Are you having pain? No  FALLS: Has patient fallen in last 6 months? Yes. Number of falls 1  PLOF: Independent  PATIENT GOALS: To improve independence and ability to complete tasks.   NEXT MD VISIT: 05/31/23  OBJECTIVE:   HAND DOMINANCE: Left  ADLs: Overall ADLs: Pt reports difficulty with getting in and out of the bed, reaching back to brush hair, reaching back to scratch her back and wash her back. Pt cannot lift pots and pans for cooking, tedious tasks are difficulty at times. Pt has difficulty sleeping due to pain.   UPPER EXTREMITY ROM:     Active ROM Right eval Left eval Right 03/13/23 Left 03/13/23  Shoulder flexion 118 115 130 122  Shoulder abduction 112 101 130 129  Shoulder internal rotation 90 90 90 90  Shoulder external rotation 55 45 58 50  Wrist flexion 40 44 48 50  Wrist extension 38 45 60 50  Wrist ulnar deviation 28 15 28 25   Wrist radial deviation 12 18 28 20   Wrist pronation 90 90    Wrist supination 90 90    (Blank rows =  not tested)  Active ROM Right eval Left eval Right 03/13/23 Left 03/13/23  Thumb MCP (0-60) 55 52 62 64  Thumb IP (0-80) 52 48 62 52  Index MCP (0-90) 60  58 60 58  Index PIP (0-100) 75  76 76 80  Index DIP (0-70)  30  20 35 40  Long MCP (0-90)  66  62 62 56  Long PIP (0-100)  90  68 76 80  Long DIP (0-70)  38  40 32 50  Ring MCP (0-90)  75  68 66 54  Ring PIP (0-100)  80  80 88 88  Ring DIP (0-70)  41  28 50 50  Little MCP (0-90)  76  44 72 72  Little PIP (0-100)  80  70 82 86  Little DIP (0-70)  50  30 58 52  (Blank rows = not tested)   UPPER EXTREMITY MMT:     MMT Right eval Left eval Right 03/13/23 Left 03/13/23  Shoulder flexion 4+/5  4+/5 4+/5 4+/5  Shoulder abduction 4+/5 4+/5 4+/5 4+/5  Shoulder internal rotation 5/5 5/5 5/5 5/5  Shoulder external rotation 5/5 4+/5 5/5 4+/5  Elbow flexion 5/5 5/5 5/5 5/5  Elbow extension 4/5 4/5 4+/5 4+/5  Wrist flexion 5/5 5/5 5/5 5/5  Wrist extension 5/5 5/5 5/5 5/5  Wrist ulnar deviation 5/5 4/5 5/5 5/5  Wrist radial deviation 5/5 4/5 5/5 5/5  Wrist pronation 5/5 5/5 5/5 5/5  Wrist supination 5/5 5/5 5/5 5/5  (Blank rows = not tested)  HAND FUNCTION: Grip strength: Right: 28 lbs; Left: 26 lbs, Lateral pinch: Right: 9 lbs, Left: 9 lbs, and 3 point pinch: Right: 8 lbs, Left: 9 lbs 03/13/23: Grip strength: Right: 29 lbs; Left: 28 lbs, Lateral pinch: Right: 10 lbs, Left: 9 lbs, and 3 point pinch: Right: 8 lbs, Left: 8 lbs  COORDINATION: 9 Hole Peg test: Right: 24.31 sec; Left: 24.90 sec  SENSATION: Tingling in hands with cold; numb feeling in left foot  EDEMA: generalized-mild   TODAY'S TREATMENT:                                                                                                                              DATE:  03/22/23 -Paraffin: 10' bilateral hands -Hand gripper: left hand-gripper at 25#, all large and medium beads with gripper vertical; right hand-gripper at 25#, all large and medium beads with gripper vertical; small beads at 25# with gripper horizontal, bilateral hands -Pt using red clothespin and 3 point pinch to grasp and stack 4 towers of 5 sponges. Pt using lateral pinch to remove and replace into bucket. Alternating bilateral hands.   03/13/23 -Paraffin: 10' bilateral hands -Discussed pt progress, goals, and updated plan of care.  -Energy conservation: educated on energy conservation techniques for ADLs, housework, and meal preparation. Educated on the 6 Ps and provided handout for strategies.   03/01/23 -Manual techniques: myofascial release to bilateral upper  arms, anterior shoulders, trapezius regions to decrease pain and fascial restrictions and  increase joint ROM -P/ROM: supine-bilateral shoulders-flexion, abduction, er/IR, horizontal abduction, 5 reps -A/ROM: supine-bilateral shoulders-protraction, flexion, abduction, er/IR, horizontal abduction, 10 reps -Scapular theraband: red-row, extension, retraction, 10 reps -Hand gripper: left hand-gripper at 22#, all large and medium beads with gripper vertical; right hand-gripper at 25#, all large and medium beads with gripper vertical -Pulleys: 1' flexion BUE    PATIENT EDUCATION: Education details: reviewed HEP Person educated: Patient and Child(ren) Education method: Explanation, Demonstration, and Handouts Education comprehension: verbalized understanding and returned demonstration  HOME EXERCISE PROGRAM: Eval: red theraputty grip and pinch exercises.  8/16: shoulder stretches 8/26: Shoulder A/ROM 9/16: energy conservation  GOALS: Goals reviewed with patient? Yes  SHORT TERM GOALS: Target date: 02/20/23  Pt will be provided with and educated on HEP to maintain mobility required for independence in ADL completion.   Goal status: MET   LONG TERM GOALS: Target date: 03/13/23  Pt will be educated on AE and DME available for use during ADLs to maintain safety and independence during completion.   Goal status:  IN PROGRESS  2.  Pt will increase BUE strength to 5/5 to improve ability to perform lightweight lifting tasks during ADLs and housework completion.    Goal status:  IN PROGRESS  3.  Pt will increase bilateral grip strength by 15# and pinch strength by 3# to improve ability to grasp and hold pots and pans when cooking.   Goal status:  IN PROGRESS  4.  Pt will be educated on pain management techniques to improve independence and comfort during ADL tasks.   Goal status:  IN PROGRESS  5.  Pt will increase BUE shoulder A/ROM by 10 degrees to improve ability to reach back and brush her hair or scratch her back.   Goal status:  MET  6.  Pt will be educated on  energy conservation strategies to improve activity tolerance required for functional task completion.   Goal status:  MET  ASSESSMENT:  CLINICAL IMPRESSION: Pt reports she has been working on incorporating the energy conservation strategies at home. She did have a fall today, she slipped outside when feeding the dog. Began session with paraffin, pt reports it helped for about two days after last session. Pt completing grip strengthening, maintaining resistance at 25# and able to add small beads. Completing pinch strengthening task upgrading to green clothespin. Verbal cuing for form and technique.   PERFORMANCE DEFICITS: in functional skills including ADLs, IADLs, coordination, dexterity, edema, ROM, strength, pain, fascial restrictions, and UE functional use    PLAN:  OT FREQUENCY: 2x/week   OT DURATION: 4 weeks  PLANNED INTERVENTIONS: therapeutic exercise, therapeutic activity, manual therapy, passive range of motion, electrical stimulation, patient/family education, and DME and/or AE instructions  RECOMMENDED OTHER SERVICES: Dietician  CONSULTED AND AGREED WITH PLAN OF CARE: Patient  PLAN FOR NEXT SESSION:  target hand ROM, grip and pinch, paraffin at beginning of session   Ezra Sites, OTR/L  [phone] 03/22/2023, 2:26 PM

## 2023-03-24 ENCOUNTER — Encounter (HOSPITAL_COMMUNITY): Payer: 59 | Admitting: Occupational Therapy

## 2023-03-28 ENCOUNTER — Encounter (HOSPITAL_COMMUNITY): Payer: Self-pay | Admitting: Occupational Therapy

## 2023-03-28 ENCOUNTER — Other Ambulatory Visit: Payer: Self-pay | Admitting: Internal Medicine

## 2023-03-28 ENCOUNTER — Ambulatory Visit (HOSPITAL_COMMUNITY): Payer: 59 | Attending: Internal Medicine | Admitting: Occupational Therapy

## 2023-03-28 DIAGNOSIS — M79641 Pain in right hand: Secondary | ICD-10-CM | POA: Insufficient documentation

## 2023-03-28 DIAGNOSIS — M79642 Pain in left hand: Secondary | ICD-10-CM | POA: Insufficient documentation

## 2023-03-28 DIAGNOSIS — R29898 Other symptoms and signs involving the musculoskeletal system: Secondary | ICD-10-CM | POA: Insufficient documentation

## 2023-03-28 DIAGNOSIS — M349 Systemic sclerosis, unspecified: Secondary | ICD-10-CM

## 2023-03-28 MED ORDER — METHOTREXATE SODIUM 2.5 MG PO TABS
15.0000 mg | ORAL_TABLET | ORAL | 0 refills | Status: DC
Start: 2023-03-28 — End: 2023-05-23

## 2023-03-28 NOTE — Therapy (Signed)
OUTPATIENT OCCUPATIONAL THERAPY ORTHO TREATMENT   Patient Name: Erin Higgins MRN: [id number] DOB:[birth year], 52 y.o., female Today's Date: 03/28/2023    END OF SESSION:  OT End of Session - 03/28/23 1342     Visit Number 8    Number of Visits 10    Date for OT Re-Evaluation 04/12/23    Authorization Type [employer], $80 copay    Authorization Time Period no visit limit    OT Start Time 1301    OT Stop Time 1343    OT Time Calculation (min) 42 min    Activity Tolerance Patient tolerated treatment well    Behavior During Therapy WFL for tasks assessed/performed                 Past Medical History:  Diagnosis Date   Heartburn    Herpes simplex of female genitalia    Past Surgical History:  Procedure Laterality Date   DILATION AND CURETTAGE OF UTERUS     TUBAL LIGATION  05/03/2012   Procedure: POST PARTUM TUBAL LIGATION;  Surgeon: Willodean Rosenthal, MD;  Location: [facility];  Service: Gynecology;  Laterality: Bilateral;  with filshie clips   Patient Active Problem List   Diagnosis Date Noted   Lateral pain of left hip 03/01/2023   Unintentional weight loss of 10% body weight within 6 months 03/01/2023   Systemic sclerosis (HCC) 01/18/2023   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 Sheliah Hatch  ONSET DATE: approximately 1 year ago  REFERRING DIAG:  L94.3 (ICD-10-CM) - Sclerodactyly  M34.9 (ICD-10-CM) - Systemic sclerosis (HCC)    THERAPY DIAG:  Other symptoms and signs involving the musculoskeletal system  Pain in left hand  Pain in right hand  Rationale for Evaluation and Treatment: Rehabilitation  SUBJECTIVE:   SUBJECTIVE STATEMENT: S: I've been using those tips from last time.    PERTINENT HISTORY: Pt is a 52 y/o female  presenting for OT evaluation with dx of scerlodactyly and systemic sclerosis. Pt reports symptoms have been present for approximately 1 year. Pt with edema, skin dryness and hardening, and generalized pain and weakness.   PRECAUTIONS: Fall  WEIGHT BEARING RESTRICTIONS: No  PAIN:  Are you having pain? No  FALLS: Has patient fallen in last 6 months? Yes. Number of falls 1  PLOF: Independent  PATIENT GOALS: To improve independence and ability to complete tasks.   NEXT MD VISIT: 05/31/23  OBJECTIVE:   HAND DOMINANCE: Left  ADLs: Overall ADLs: Pt reports difficulty with getting in and out of the bed, reaching back to brush hair, reaching back to scratch her back and wash her back. Pt cannot lift pots and pans for cooking, tedious tasks are difficulty at times. Pt has difficulty sleeping due to pain.   UPPER EXTREMITY ROM:     Active ROM Right eval Left eval Right 03/13/23 Left 03/13/23  Shoulder flexion 118 115 130 122  Shoulder abduction 112 101 130 129  Shoulder internal rotation 90 90 90 90  Shoulder external rotation 55 45 58 50  Wrist flexion 40 44 48 50  Wrist extension 38 45 60 50  Wrist ulnar deviation 28 15 28 25   Wrist radial deviation 12 18 28 20   Wrist pronation 90 90    Wrist supination 90 90    (Blank  rows = not tested)  Active ROM Right eval Left eval Right 03/13/23 Left 03/13/23  Thumb MCP (0-60) 55 52 62 64  Thumb IP (0-80) 52 48 62 52  Index MCP (0-90) 60  58 60 58  Index PIP (0-100) 75  76 76 80  Index DIP (0-70)  30  20 35 40  Long MCP (0-90)  66  62 62 56  Long PIP (0-100)  90  68 76 80  Long DIP (0-70)  38  40 32 50  Ring MCP (0-90)  75  68 66 54  Ring PIP (0-100)  80  80 88 88  Ring DIP (0-70)  41  28 50 50  Little MCP (0-90)  76  44 72 72  Little PIP (0-100)  80  70 82 86  Little DIP (0-70)  50  30 58 52  (Blank rows = not tested)   UPPER EXTREMITY MMT:     MMT Right eval Left eval Right 03/13/23 Left 03/13/23  Shoulder flexion 4+/5  4+/5 4+/5 4+/5  Shoulder abduction 4+/5 4+/5 4+/5 4+/5  Shoulder internal rotation 5/5 5/5 5/5 5/5  Shoulder external rotation 5/5 4+/5 5/5 4+/5  Elbow flexion 5/5 5/5 5/5 5/5  Elbow extension 4/5 4/5 4+/5 4+/5  Wrist flexion 5/5 5/5 5/5 5/5  Wrist extension 5/5 5/5 5/5 5/5  Wrist ulnar deviation 5/5 4/5 5/5 5/5  Wrist radial deviation 5/5 4/5 5/5 5/5  Wrist pronation 5/5 5/5 5/5 5/5  Wrist supination 5/5 5/5 5/5 5/5  (Blank rows = not tested)  HAND FUNCTION: Grip strength: Right: 28 lbs; Left: 26 lbs, Lateral pinch: Right: 9 lbs, Left: 9 lbs, and 3 point pinch: Right: 8 lbs, Left: 9 lbs 03/13/23: Grip strength: Right: 29 lbs; Left: 28 lbs, Lateral pinch: Right: 10 lbs, Left: 9 lbs, and 3 point pinch: Right: 8 lbs, Left: 8 lbs  COORDINATION: 9 Hole Peg test: Right: 24.31 sec; Left: 24.90 sec  SENSATION: Tingling in hands with cold; numb feeling in left foot  EDEMA: generalized-mild   TODAY'S TREATMENT:                                                                                                                              DATE:  03/28/23 -Paraffin: 10' bilateral hands -Therapy ball strengthening: using basketball-chest press, flexion, overhead press, circles each direction, 10 reps each -ABC writing: each UE, positioned at 90 degrees flexion -Hand gripper: left hand-gripper at 25#, all large beads with gripper vertical, and medium beads with gripper vertical and horizontal; right hand-gripper at 25#, all large beads with gripper vertical, and medium beads with gripper vertical and horizontal; small beads at 25# with gripper horizontal -Theraputty: red-pinching around border of putty with 3 point pinch, bilateral hands; rolling and then pinching along the roll with lateral pinch  03/22/23 -Paraffin: 10' bilateral hands -Hand gripper: left hand-gripper at 25#, all large and medium beads with gripper vertical; right hand-gripper  at 25#, all large and medium beads with gripper  vertical; small beads at 25# with gripper horizontal, bilateral hands -Pt using red clothespin and 3 point pinch to grasp and stack 4 towers of 5 sponges. Pt using lateral pinch to remove and replace into bucket. Alternating bilateral hands.   03/13/23 -Paraffin: 10' bilateral hands -Discussed pt progress, goals, and updated plan of care.  -Energy conservation: educated on energy conservation techniques for ADLs, housework, and meal preparation. Educated on the 6 Ps and provided handout for strategies.   03/01/23 -Manual techniques: myofascial release to bilateral upper arms, anterior shoulders, trapezius regions to decrease pain and fascial restrictions and increase joint ROM -P/ROM: supine-bilateral shoulders-flexion, abduction, er/IR, horizontal abduction, 5 reps -A/ROM: supine-bilateral shoulders-protraction, flexion, abduction, er/IR, horizontal abduction, 10 reps -Scapular theraband: red-row, extension, retraction, 10 reps -Hand gripper: left hand-gripper at 22#, all large and medium beads with gripper vertical; right hand-gripper at 25#, all large and medium beads with gripper vertical -Pulleys: 1' flexion BUE    PATIENT EDUCATION: Education details: reviewed HEP Person educated: Patient and Child(ren) Education method: Explanation, Demonstration, and Handouts Education comprehension: verbalized understanding and returned demonstration  HOME EXERCISE PROGRAM: Eval: red theraputty grip and pinch exercises.  8/16: shoulder stretches 8/26: Shoulder A/ROM 9/16: energy conservation  GOALS: Goals reviewed with patient? Yes  SHORT TERM GOALS: Target date: 02/20/23  Pt will be provided with and educated on HEP to maintain mobility required for independence in ADL completion.   Goal status: MET   LONG TERM GOALS: Target date: 03/13/23  Pt will be educated on AE and DME available for use during ADLs to maintain safety and independence during completion.   Goal status:  IN  PROGRESS  2.  Pt will increase BUE strength to 5/5 to improve ability to perform lightweight lifting tasks during ADLs and housework completion.    Goal status:  IN PROGRESS  3.  Pt will increase bilateral grip strength by 15# and pinch strength by 3# to improve ability to grasp and hold pots and pans when cooking.   Goal status:  IN PROGRESS  4.  Pt will be educated on pain management techniques to improve independence and comfort during ADL tasks.   Goal status:  IN PROGRESS  5.  Pt will increase BUE shoulder A/ROM by 10 degrees to improve ability to reach back and brush her hair or scratch her back.   Goal status:  MET  6.  Pt will be educated on energy conservation strategies to improve activity tolerance required for functional task completion.   Goal status:  MET  ASSESSMENT:  CLINICAL IMPRESSION: Pt reports she has been doing her HEP, went to a new dermatologist today and feels good about that. Began session with paraffin, targeted BUE strengthening first then progressed to grip and pinch strengthening. Increased time for beads today and pt required to turn gripper horizontal. Pt without increased pain during session, paraffin continues to be beneficial for improved functioning during session. Verbal cuing for form and technique.   PERFORMANCE DEFICITS: in functional skills including ADLs, IADLs, coordination, dexterity, edema, ROM, strength, pain, fascial restrictions, and UE functional use    PLAN:  OT FREQUENCY: 2x/week   OT DURATION: 4 weeks  PLANNED INTERVENTIONS: therapeutic exercise, therapeutic activity, manual therapy, passive range of motion, electrical stimulation, patient/family education, and DME and/or AE instructions  RECOMMENDED OTHER SERVICES: Dietician  CONSULTED AND AGREED WITH PLAN OF CARE: Patient  PLAN FOR NEXT SESSION:  target hand ROM, grip and  pinch, paraffin at beginning of session   Ezra Sites, OTR/L  [phone] 03/28/2023, 1:44  PM

## 2023-03-28 NOTE — Telephone Encounter (Signed)
Last Fill: 01/18/2023  Labs: 03/01/2023 edimentation rate is increased at 29 up form 17 but CRP improved to 5.4 from 16. I am not sure but this probably reflects a decrease in inflammation activity. There is no problem with blood count, kidney, or liver function from methotrexate so that is fine to continue.   Next Visit: 05/31/2023  Last Visit: 03/01/2023  DX: Systemic sclerosis   Current Dose per office note 03/01/2023: methotrexate 15 mg p.o. weekly   Okay to refill Methotrexate?

## 2023-03-31 ENCOUNTER — Encounter (HOSPITAL_COMMUNITY): Payer: 59 | Admitting: Occupational Therapy

## 2023-04-05 ENCOUNTER — Encounter (HOSPITAL_COMMUNITY): Payer: 59 | Admitting: Occupational Therapy

## 2023-04-07 ENCOUNTER — Ambulatory Visit (HOSPITAL_COMMUNITY): Payer: 59 | Admitting: Occupational Therapy

## 2023-04-07 ENCOUNTER — Encounter (HOSPITAL_COMMUNITY): Payer: Self-pay | Admitting: Occupational Therapy

## 2023-04-07 DIAGNOSIS — M79642 Pain in left hand: Secondary | ICD-10-CM

## 2023-04-07 DIAGNOSIS — R29898 Other symptoms and signs involving the musculoskeletal system: Secondary | ICD-10-CM

## 2023-04-07 DIAGNOSIS — M79641 Pain in right hand: Secondary | ICD-10-CM

## 2023-04-07 NOTE — Therapy (Signed)
OUTPATIENT OCCUPATIONAL THERAPY ORTHO TREATMENT   Patient Name: Erin Higgins MRN: [id number] DOB:September 21, 1970, 52 y.o., female Today's Date: 04/07/2023    END OF SESSION:  OT End of Session - 04/07/23 1428     Visit Number 9    Number of Visits 10    Date for OT Re-Evaluation 04/12/23    Authorization Type [employer], $80 copay    Authorization Time Period no visit limit    OT Start Time 1347    OT Stop Time 1428    OT Time Calculation (min) 41 min    Activity Tolerance Patient tolerated treatment well    Behavior During Therapy WFL for tasks assessed/performed                  Past Medical History:  Diagnosis Date   Heartburn    Herpes simplex of female genitalia    Past Surgical History:  Procedure Laterality Date   DILATION AND CURETTAGE OF UTERUS     TUBAL LIGATION  05/03/2012   Procedure: POST PARTUM TUBAL LIGATION;  Surgeon: Willodean Rosenthal, MD;  Location: [facility];  Service: Gynecology;  Laterality: Bilateral;  with filshie clips   Patient Active Problem List   Diagnosis Date Noted   Lateral pain of left hip 03/01/2023   Unintentional weight loss of 10% body weight within 6 months 03/01/2023   Systemic sclerosis (HCC) 01/18/2023   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    PERTINENT HISTORY: Pt is a 52 y/o female  presenting for OT evaluation with dx of scerlodactyly and systemic sclerosis. Pt reports symptoms have been present for approximately 1 year. Pt with edema, skin dryness and hardening, and generalized pain and weakness.   PRECAUTIONS: Fall  WEIGHT BEARING RESTRICTIONS: No  PAIN:  Are you having pain? Yes: NPRS scale: 6/10 Pain location: bilateral legs Pain description: aching, sore Aggravating factors: cold Relieving factors: being warmer  FALLS: Has patient fallen in last 6 months? Yes. Number of falls 1  PLOF: Independent  PATIENT GOALS: To improve independence and ability to complete tasks.   NEXT MD VISIT: 05/31/23  OBJECTIVE:   HAND DOMINANCE: Left  ADLs: Overall ADLs: Pt reports difficulty with getting in and out of the bed, reaching back to brush hair, reaching back to scratch her back and wash her back. Pt cannot lift pots and pans for cooking, tedious tasks are difficulty at times. Pt has difficulty sleeping due to pain.   UPPER EXTREMITY ROM:     Active ROM Right eval Left eval Right 03/13/23 Left 03/13/23  Shoulder flexion 118 115 130 122  Shoulder abduction 112 101 130 129  Shoulder internal rotation 90 90 90 90  Shoulder external rotation 55 45 58 50  Wrist flexion 40 44 48 50  Wrist extension 38 45 60 50  Wrist ulnar deviation 28 15 28 25   Wrist radial deviation 12 18  28 20  Wrist pronation 90 90    Wrist supination 90 90    (Blank rows = not tested)  Active ROM Right eval Left eval Right 03/13/23 Left 03/13/23  Thumb MCP (0-60) 55 52 62 64  Thumb IP (0-80) 52 48 62 52  Index MCP (0-90) 60  58 60 58  Index PIP (0-100) 75  76 76 80  Index DIP (0-70)  30  20 35 40  Long MCP (0-90)  66  62 62 56  Long PIP (0-100)  90  68 76 80  Long DIP (0-70)  38  40 32 50  Ring MCP (0-90)  75  68 66 54  Ring PIP (0-100)  80  80 88 88  Ring DIP (0-70)  41  28 50 50  Little MCP (0-90)  76  44 72 72  Little PIP (0-100)  80  70 82 86  Little DIP (0-70)  50  30 58  52  (Blank rows = not tested)   UPPER EXTREMITY MMT:     MMT Right eval Left eval Right 03/13/23 Left 03/13/23  Shoulder flexion 4+/5 4+/5 4+/5 4+/5  Shoulder abduction 4+/5 4+/5 4+/5 4+/5  Shoulder internal rotation 5/5 5/5 5/5 5/5  Shoulder external rotation 5/5 4+/5 5/5 4+/5  Elbow flexion 5/5 5/5 5/5 5/5  Elbow extension 4/5 4/5 4+/5 4+/5  Wrist flexion 5/5 5/5 5/5 5/5  Wrist extension 5/5 5/5 5/5 5/5  Wrist ulnar deviation 5/5 4/5 5/5 5/5  Wrist radial deviation 5/5 4/5 5/5 5/5  Wrist pronation 5/5 5/5 5/5 5/5  Wrist supination 5/5 5/5 5/5 5/5  (Blank rows = not tested)  HAND FUNCTION: Grip strength: Right: 28 lbs; Left: 26 lbs, Lateral pinch: Right: 9 lbs, Left: 9 lbs, and 3 point pinch: Right: 8 lbs, Left: 9 lbs 03/13/23: Grip strength: Right: 29 lbs; Left: 28 lbs, Lateral pinch: Right: 10 lbs, Left: 9 lbs, and 3 point pinch: Right: 8 lbs, Left: 8 lbs  COORDINATION: 9 Hole Peg test: Right: 24.31 sec; Left: 24.90 sec  SENSATION: Tingling in hands with cold; numb feeling in left foot  EDEMA: generalized-mild   TODAY'S TREATMENT:                                                                                                                              DATE:  04/07/23 -Paraffin: 10' bilateral hands -Theraband strengthening: red band-protraction, flexion, abduction, er, horizontal abduction, 10 reps -ABC writing: each UE, positioned at 90 degrees flexion; 1 rest break for RUE at R -Hand gripper: left hand-gripper at 29#, all large, medium, and small beads with gripper horizontal; right hand-gripper at 29#, all large, medium, and small beads with gripper horizontal -Pt using red clothespin and 3 point pinch with left hand to grasp and stack 3 towers of 5 sponges. Pt then using right hand and 3 point pinch to replace sponges into the bucket.   03/28/23 -Paraffin: 10'  bilateral hands -Therapy ball strengthening: using basketball-chest press, flexion, overhead press,  circles each direction, 10 reps each -ABC writing: each UE, positioned at 90 degrees flexion -Hand gripper: left hand-gripper at 25#, all large beads with gripper vertical, and medium beads with gripper vertical and horizontal; right hand-gripper at 25#, all large beads with gripper vertical, and medium beads with gripper vertical and horizontal; small beads at 25# with gripper horizontal -Theraputty: red-pinching around border of putty with 3 point pinch, bilateral hands; rolling and then pinching along the roll with lateral pinch  03/22/23 -Paraffin: 10' bilateral hands -Hand gripper: left hand-gripper at 25#, all large and medium beads with gripper vertical; right hand-gripper at 25#, all large and medium beads with gripper vertical; small beads at 25# with gripper horizontal, bilateral hands -Pt using red clothespin and 3 point pinch to grasp and stack 4 towers of 5 sponges. Pt using lateral pinch to remove and replace into bucket. Alternating bilateral hands.      PATIENT EDUCATION: Education details: reviewed HEP Person educated: Patient and Child(ren) Education method: Explanation, Demonstration, and Handouts Education comprehension: verbalized understanding and returned demonstration  HOME EXERCISE PROGRAM: Eval: red theraputty grip and pinch exercises.  8/16: shoulder stretches 8/26: Shoulder A/ROM 9/16: energy conservation  GOALS: Goals reviewed with patient? Yes  SHORT TERM GOALS: Target date: 02/20/23  Pt will be provided with and educated on HEP to maintain mobility required for independence in ADL completion.   Goal status: MET   LONG TERM GOALS: Target date: 03/13/23  Pt will be educated on AE and DME available for use during ADLs to maintain safety and independence during completion.   Goal status:  IN PROGRESS  2.  Pt will increase BUE strength to 5/5 to improve ability to perform lightweight lifting tasks during ADLs and housework completion.    Goal status:   IN PROGRESS  3.  Pt will increase bilateral grip strength by 15# and pinch strength by 3# to improve ability to grasp and hold pots and pans when cooking.   Goal status:  IN PROGRESS  4.  Pt will be educated on pain management techniques to improve independence and comfort during ADL tasks.   Goal status:  IN PROGRESS  5.  Pt will increase BUE shoulder A/ROM by 10 degrees to improve ability to reach back and brush her hair or scratch her back.   Goal status:  MET  6.  Pt will be educated on energy conservation strategies to improve activity tolerance required for functional task completion.   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 Session continuing to target BUE strength, grip and pinch strength. Pt was able to increase the resistance of the hand gripper to 29# however required to hold it horizontally. Fatigue noted with theraband strengthening. Verbal cuing for form and technique.   PERFORMANCE DEFICITS: in functional skills including ADLs, IADLs, coordination, dexterity, edema, ROM, strength, pain, fascial restrictions, and UE functional use    PLAN:  OT FREQUENCY: 2x/week   OT DURATION: 4 weeks  PLANNED INTERVENTIONS: therapeutic exercise, therapeutic activity, manual therapy, passive range of motion, electrical stimulation, patient/family education, and DME and/or AE instructions  RECOMMENDED OTHER SERVICES: Dietician  CONSULTED AND AGREED WITH PLAN OF CARE: Patient  PLAN FOR NEXT SESSION:  target hand ROM, grip and pinch, paraffin at beginning of session   Ezra Sites,  OTR/L  [phone] 04/07/2023, 2:28 PM

## 2023-04-10 LAB — LAB REPORT - SCANNED: EGFR: 111

## 2023-04-12 ENCOUNTER — Encounter (HOSPITAL_COMMUNITY): Payer: 59 | Admitting: Occupational Therapy

## 2023-04-12 ENCOUNTER — Telehealth (HOSPITAL_COMMUNITY): Payer: Self-pay | Admitting: Occupational Therapy

## 2023-04-12 NOTE — Telephone Encounter (Signed)
Called pt regarding no-show for 1:00pm appt today. No answer, left voicemail requesting call back as this was pt's last scheduled appt.   Ezra Sites, OTR/L  [phone] 04/12/23

## 2023-04-18 ENCOUNTER — Ambulatory Visit (INDEPENDENT_AMBULATORY_CARE_PROVIDER_SITE_OTHER): Payer: 59 | Admitting: Gastroenterology

## 2023-04-18 ENCOUNTER — Encounter: Payer: Self-pay | Admitting: Gastroenterology

## 2023-04-18 VITALS — BP 110/70 | HR 80 | Ht 64.0 in | Wt 125.6 lb

## 2023-04-18 DIAGNOSIS — Z8619 Personal history of other infectious and parasitic diseases: Secondary | ICD-10-CM

## 2023-04-18 DIAGNOSIS — R1310 Dysphagia, unspecified: Secondary | ICD-10-CM

## 2023-04-18 DIAGNOSIS — M349 Systemic sclerosis, unspecified: Secondary | ICD-10-CM | POA: Diagnosis not present

## 2023-04-18 DIAGNOSIS — R195 Other fecal abnormalities: Secondary | ICD-10-CM

## 2023-04-18 DIAGNOSIS — Z1211 Encounter for screening for malignant neoplasm of colon: Secondary | ICD-10-CM

## 2023-04-18 DIAGNOSIS — R634 Abnormal weight loss: Secondary | ICD-10-CM | POA: Diagnosis not present

## 2023-04-18 NOTE — Patient Instructions (Addendum)
We have sent the following medications to your pharmacy for you to pick up at your convenience: Suprep   Your provider has requested that you go to the basement level for lab work before leaving today. Press "B" on the elevator. The lab is located at the first door on the left as you exit the elevator.  You have been scheduled for an endoscopy and colonoscopy. Please follow the written instructions given to you at your visit today.  Please pick up your prep supplies at the pharmacy within the next 1-3 days.  If you use inhalers (even only as needed), please bring them with you on the day of your procedure.  DO NOT TAKE 7 DAYS PRIOR TO TEST- Trulicity (dulaglutide) Ozempic, Wegovy (semaglutide) Mounjaro (tirzepatide) Bydureon Bcise (exanatide extended release)  DO NOT TAKE 1 DAY PRIOR TO YOUR TEST Rybelsus (semaglutide) Adlyxin (lixisenatide) Victoza (liraglutide) Byetta (exanatide) ___________________________________________________________________________  Due to recent changes in healthcare laws, you may see the results of your imaging and laboratory studies on MyChart before your provider has had a chance to review them.  We understand that in some cases there may be results that are confusing or concerning to you. Not all laboratory results come back in the same time frame and the provider may be waiting for multiple results in order to interpret others.  Please give [place] 48 hours in order for your provider to thoroughly review all the results before contacting the office for clarification of your results.   Thank you for choosing me and [facility].  Dr.Scott Tomasa Rand

## 2023-04-18 NOTE — Progress Notes (Unsigned)
HPI : Erin Higgins is a 52 y.o. female with recent diagnosis of scleroderma who is referred to [place] by Fuller Plan, MD for further evaluation of unintentional weight loss. She currently weighs 125lbs.  In June 2024, she weighted 139lbs.   Considers normal weight around 135-140lb. The patient states that after she was diagnosed with scleroderma, she did a lot of reading on the Internet, and saw that it was recommended that she avoid gluten and carbohydrates because of her diagnosis.  She has been avoiding these foods since then.  She denies any significant problems with abdominal pain or diarrhea.  She does note that she will have diarrhea when she eats milkshakes.  She notes mucus in her stool sometimes as well.  She does report decreased appetite, but denies postprandial abdominal pain, nausea or vomiting. 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  She states she was never given any medications to treat these infections. The patient denies any exposure to untreated water.  No recent camping or hiking and drinking from streams.  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 Currently, she reports having soft stools, typically 1 to 2/day.  Sometimes she has urgency, gas and bloating.  No blood in the stool. She denies symptoms of heartburn, but does report symptoms of excessive phlegm and throat irritation at times, particularly at night.  He has avoided eating late in the evening, and this has helped.  Sometimes she feels like food gets stuck when she swallows.  She has not had to forcefully vomit up stuck food.  No major issues swallowing pills.  No family history of colon cancer.  Her paternal grandmother was diagnosed with stomach cancer. She has never  had any prior colon cancer screening.  No previous upper endoscopy.   Past Medical History:  Diagnosis Date   Heartburn    Herpes simplex of female genitalia   Scleroderma   Past Surgical History:  Procedure Laterality Date   DILATION AND CURETTAGE OF UTERUS     TUBAL LIGATION  05/03/2012   Procedure: POST PARTUM TUBAL LIGATION;  Surgeon: Willodean Rosenthal, MD;  Location: [facility];  Service: Gynecology;  Laterality: Bilateral;  with filshie clips   Family History  Problem Relation Age of Onset   Hyperlipidemia Mother    Hypertension Mother    Diabetes type II Mother    Sleep apnea Mother    Breast cancer Mother    Hypertension Father    Ulcerative colitis Father    Vitiligo Paternal Great-grandmother    Lupus Paternal Aunt    Social History   Tobacco Use   Smoking status: Never    Passive exposure: Never   Smokeless tobacco: Never  Vaping Use   Vaping status: Never Used  Substance Use Topics   Alcohol use: Not Currently    Alcohol/week: 1.0 standard drink of alcohol    Types: 1 Glasses of wine per week    Comment: rarely   Drug use: No   Current Outpatient Medications  Medication Sig Dispense Refill   amLODipine (NORVASC) 2.5 MG tablet TAKE 1 TABLET(2.5 MG) BY MOUTH DAILY 90 tablet 1   amoxicillin (AMOXIL) 875 MG tablet SMARTSIG:1 Tablet(s) By Mouth (Patient not taking: Reported on  01/18/2023)     B Complex Vitamins (B COMPLEX VITAMIN PO) Take by mouth. liquid     Black Pepper-Turmeric (TURMERIC COMPLEX/BLACK PEPPER PO) Take by mouth. liquid     cyclobenzaprine (FLEXERIL) 5 MG tablet Take 1 tablet (5 mg total) by mouth at bedtime as needed for muscle spasms. 30 tablet 0   folic acid (FOLVITE) 1 MG tablet Take 1 tablet (1 mg total) by mouth daily. 90 tablet 3   ibuprofen (ADVIL) 600 MG tablet Take 1 tablet (600 mg total) by mouth 3 (three) times daily. 15 tablet 0   ibuprofen (ADVIL) 800 MG tablet Take 800 mg by mouth. (Patient not taking: Reported on 03/01/2023)      methotrexate (RHEUMATREX) 2.5 MG tablet Take 6 tablets (15 mg total) by mouth once a week. Caution:Chemotherapy. Protect from light. 72 tablet 0   metoprolol tartrate (LOPRESSOR) 100 MG tablet Take 1 tablet (100 mg total) by mouth once for 1 dose. 2 hours before your CT scan 1 tablet 0   NON FORMULARY Lymphatic drops liquid     OVER THE COUNTER MEDICATION Take by mouth daily. Pt states that she takes 2 vitamin B12 gummies daily.     OVER THE COUNTER MEDICATION Take by mouth daily. Pt states that she takes 2 vitamin D3 gummies daily.     UNABLE TO FIND Med Name: vegetable capsules     UNABLE TO FIND Med Name: Bone Health     Vitamin D-Vitamin K (VITAMIN K2-VITAMIN D3 PO) Take by mouth. liquid     No current facility-administered medications for this visit.   Allergies  Allergen Reactions   Penicillins Swelling    Has patient had a PCN reaction causing immediate rash, facial/tongue/throat swelling, SOB or lightheadedness with hypotension: No  Has patient had a PCN reaction causing severe rash involving mucus membranes or skin necrosis: No  Has patient had a PCN reaction that required hospitalization: No  Has patient had a PCN reaction occurring within the last 10 years: No  If all of the above answers are "NO", then may proceed with Cephalosporin use.   Latex     Pt states that recently her hands have been itchy when she uses latex gloves.     Review of Systems: All systems reviewed and negative except where noted in HPI.    No results found.  Physical Exam: BP 110/70   Pulse 80   Ht 5\' 4"  (1.626 m)   Wt 125 lb 9.6 oz (57 kg)   SpO2 99%   BMI 21.56 kg/m  Constitutional: Pleasant,well-developed, African-American female in no acute distress.  Accompanied by her son HEENT: Normocephalic and atraumatic. Conjunctivae are normal. No scleral icterus. Neck supple.  Cardiovascular: Normal rate, regular rhythm.  Pulmonary/chest: Effort normal and breath sounds normal. No  wheezing, rales or rhonchi. Abdominal: Soft, nondistended, nontender. Bowel sounds active throughout. There are no masses palpable. No hepatomegaly. Extremities: no edema Neurological: Alert and oriented to person place and time. Skin: Skin is thickened and taut.  Scattered areas of hypopigmentation on bilateral upper extremities Psychiatric: Normal mood and affect. Behavior is normal.  CBC    Component Value Date/Time   WBC 6.1 03/01/2023 1135   RBC 4.37 03/01/2023 1135   HGB 11.7 03/01/2023 1135   HCT 36.4 03/01/2023 1135   PLT 330 03/01/2023 1135   MCV 83.3 03/01/2023 1135   MCH 26.8 (L) 03/01/2023 1135   MCHC 32.1 03/01/2023 1135   RDW 15.1 (H) 03/01/2023 1135  LYMPHSABS 1,714 03/01/2023 1135   MONOABS 0.7 05/23/2022 1152   EOSABS 43 03/01/2023 1135   BASOSABS 18 03/01/2023 1135    CMP     Component Value Date/Time   NA 138 03/01/2023 1135   K 4.4 03/01/2023 1135   CL 103 03/01/2023 1135   CO2 26 03/01/2023 1135   GLUCOSE 92 03/01/2023 1135   BUN 16 03/01/2023 1135   CREATININE 0.54 03/01/2023 1135   CALCIUM 9.7 03/01/2023 1135   PROT 7.4 03/01/2023 1135   ALBUMIN 3.6 05/23/2022 1218   AST 18 03/01/2023 1135   ALT 11 03/01/2023 1135   ALKPHOS 43 05/23/2022 1218   BILITOT 0.6 03/01/2023 1135   GFRNONAA >60 07/12/2022 1107   GFRAA >60 03/28/2017 0943       Latest Ref Rng & Units 03/01/2023   11:35 AM 07/12/2022   11:07 AM 05/23/2022   11:52 AM  CBC EXTENDED  WBC 3.8 - 10.8 Thousand/uL 6.1  4.7  5.9   RBC 3.80 - 5.10 Million/uL 4.37  4.30  4.34   Hemoglobin 11.7 - 15.5 g/dL 01.0  27.2  53.6   HCT 35.0 - 45.0 % 36.4  37.2  37.7   Platelets 140 - 400 Thousand/uL 330  225  271   NEUT# 1,500 - 7,800 cells/uL 3,654   2.9   Lymph# 850 - 3,900 cells/uL 1,714   2.3       ASSESSMENT AND PLAN: 52 year old female with recently diagnosed scleroderma with about 10 pounds of unintentional weight loss over the past 6 months or so.  She has made some dietary changes  characterized by decreased consumption of carbohydrates and sugar.  This may account for some of this weight loss.  She has had some episodes of diarrhea and mucus in the stool, and reportedly had stool testing through veterinarian's office which demonstrated the presence of Giardia and diphyllobothrium latum.  Currently, she does not have significant diarrhea, but does have some bothersome bloating and gas.  Certainly, parasitic infections could also cause weight loss. She has occasional reflux symptoms and some mild dysphagia symptoms.  She is at risk for motility disorders of the esophagus, and prone to acid reflux given her scleroderma. I recommended we obtain an upper endoscopy to assess for evidence of reflux esophagitis and for esophageal dysmotility.  We will also exclude malignancy as a cause of her unintentional weight loss. The patient is overdue for her initial average risk screening colonoscopy.  We will schedule her for a colonoscopy at the time of her EGD.  Weight loss - EGD, colonoscopy  Questionable history of parasitic infection (giardiasis, tapeworm) - GI PCR panel  GERD, dysphagia/scleroderma - EGD  Colon cancer screening - Colonoscopy  The details, risks (including bleeding, perforation, infection, missed lesions, medication reactions and possible hospitalization or surgery if complications occur), benefits, and alternatives to EGD/colonoscopy with possible biopsy and possible polypectomy were discussed with the patient and she consents to proceed.   Keanen Dohse E. Tomasa Rand, MD [facility]  I spent a total of 45 minutes reviewing the patient's medical record, interviewing and examining the patient, discussing her diagnosis and management of her condition going forward, and documenting in the medical record    Rice, Jamesetta Orleans, MD

## 2023-04-19 ENCOUNTER — Other Ambulatory Visit: Payer: 59

## 2023-04-19 ENCOUNTER — Other Ambulatory Visit: Payer: Self-pay | Admitting: Gastroenterology

## 2023-04-20 LAB — GI PROFILE, STOOL, PCR

## 2023-04-21 ENCOUNTER — Encounter (HOSPITAL_COMMUNITY): Payer: Self-pay | Admitting: Occupational Therapy

## 2023-04-21 ENCOUNTER — Ambulatory Visit (HOSPITAL_COMMUNITY): Payer: 59 | Admitting: Occupational Therapy

## 2023-04-21 DIAGNOSIS — R29898 Other symptoms and signs involving the musculoskeletal system: Secondary | ICD-10-CM | POA: Diagnosis not present

## 2023-04-21 DIAGNOSIS — M79642 Pain in left hand: Secondary | ICD-10-CM

## 2023-04-21 DIAGNOSIS — M79641 Pain in right hand: Secondary | ICD-10-CM

## 2023-04-21 NOTE — Therapy (Unsigned)
OUTPATIENT OCCUPATIONAL THERAPY ORTHO TREATMENT   Patient Name: Erin Higgins MRN: [id number] DOB:1971-03-15, 52 y.o., female Today's Date: 04/23/2023  OCCUPATIONAL THERAPY DISCHARGE SUMMARY  Visits from Start of Care: 10  Current functional level related to goals / functional outcomes: Pt has met 6 out of 7 OT goals. She has been provided extensive education on AE, pain relief techniques, and HEP for improving and maintaining her mobility and strength.    Remaining deficits: Pt's grip and pinch strength have improved incrementally, however still remains limited.    Education / Equipment: Pt has been provided a comprehensive HEP.   Plan: Patient agrees to discharge as she has met most of her OT goals.      END OF SESSION:     04/21/23 1346  OT Visits / Re-Eval  Visit Number 10  Number of Visits 10  Date for OT Re-Evaluation 04/12/23  Authorization  Authorization Type [employer], $80 copay  Authorization Time Period no visit limit  OT Time Calculation  OT Start Time 1308  OT Stop Time 1346  OT Time Calculation (min) 38 min  End of Session  Activity Tolerance Patient tolerated treatment well  Behavior During Therapy WFL for tasks assessed/performed   Past Medical History:  Diagnosis Date   Heartburn    Herpes simplex of female genitalia    Scleredema (HCC)    Past Surgical History:  Procedure Laterality Date   DILATION AND CURETTAGE OF UTERUS     TUBAL LIGATION  05/03/2012   Procedure: POST PARTUM TUBAL LIGATION;  Surgeon: Willodean Rosenthal, MD;  Location: [facility];  Service: Gynecology;  Laterality: Bilateral;  with filshie clips   Patient Active Problem List   Diagnosis Date Noted   Lateral pain of left hip 03/01/2023   Unintentional weight loss of 10% body weight within 6 months 03/01/2023   Systemic sclerosis (HCC) 01/18/2023   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 Sheliah Hatch  ONSET DATE: approximately 1 year ago  REFERRING DIAG:  L94.3 (ICD-10-CM) - Sclerodactyly  M34.9 (ICD-10-CM) - Systemic sclerosis (HCC)    THERAPY DIAG:  Other symptoms and signs involving the musculoskeletal system  Pain in left hand  Pain in right hand  Rationale for Evaluation and Treatment: Rehabilitation  SUBJECTIVE:   SUBJECTIVE STATEMENT: S: I have good and bad days still.    PERTINENT HISTORY: Pt is a 52 y/o female presenting for OT evaluation with dx of scerlodactyly and systemic sclerosis. Pt reports symptoms have been present for approximately 1 year. Pt with edema, skin dryness and hardening, and generalized pain and weakness.   PRECAUTIONS: Fall  WEIGHT BEARING RESTRICTIONS: No  PAIN:  Are you having pain? Yes: NPRS scale: 6/10 Pain location: bilateral legs Pain description: aching, sore Aggravating factors: cold Relieving factors: being warmer  FALLS: Has patient fallen in last 6 months? Yes. Number of falls 1  PLOF: Independent  PATIENT GOALS: To improve independence and ability to complete tasks.   NEXT MD VISIT: 05/31/23  OBJECTIVE:   HAND DOMINANCE: Left  ADLs: Overall ADLs: Pt reports difficulty with getting in and out of the bed, reaching back to brush hair, reaching back to scratch her back and wash her back. Pt cannot lift pots and pans for cooking, tedious tasks are difficulty at times.  Pt has difficulty sleeping due to pain.   UPPER EXTREMITY ROM:     Active ROM Right eval Left eval Right 03/13/23 Left 03/13/23 Right 04/21/23 Left 04/21/23  Shoulder flexion 118 115 130 122 128 122  Shoulder abduction 112 101 130 129 129 130  Shoulder internal rotation 90 90 90 90 90 90  Shoulder external rotation 55 45 58 50 71 62  Wrist flexion 40 44 48 50 50 50  Wrist extension  38 45 60 50 62 62  Wrist ulnar deviation 28 15 28 25  33 30  Wrist radial deviation 12 18 28 20 26  34  Wrist pronation 90 90      Wrist supination 90 90      (Blank rows = not tested)  Active ROM Right eval Left eval Right 03/13/23 Left 03/13/23 Right 04/21/23 Left 10/25/24R  Thumb MCP (0-60) 55 52 62 64 45 45  Thumb IP (0-80) 52 48 62 52 50 40  Index MCP (0-90) 60  58 60 58 55 67  Index PIP (0-100) 75  76 76 80 80 75  Index DIP (0-70)  30  20 35 40 40 35  Long MCP (0-90)  66  62 62 56 75 68  Long PIP (0-100)  90  68 76 80 90 80  Long DIP (0-70)  38  40 32 50 50 35  Ring MCP (0-90)  75  68 66 54 80 65  Ring PIP (0-100)  80  80 88 88 94 90  Ring DIP (0-70)  41  28 50 50 50 40  Little MCP (0-90)  76  44 72 72 80 50  Little PIP (0-100)  80  70 82 86 85 84  Little DIP (0-70)  50  30 58 52 50 35  (Blank rows = not tested)   UPPER EXTREMITY MMT:     MMT Right eval Left eval Right 03/13/23 Left 03/13/23 Right 04/21/23 Left 04/21/23  Shoulder flexion 4+/5 4+/5 4+/5 4+/5 4+/5 5/5  Shoulder abduction 4+/5 4+/5 4+/5 4+/5 5/5 5/5  Shoulder internal rotation 5/5 5/5 5/5 5/5 5/5 5/5  Shoulder external rotation 5/5 4+/5 5/5 4+/5 5/5 5/5  Elbow flexion 5/5 5/5 5/5 5/5 5/5 5/5  Elbow extension 4/5 4/5 4+/5 4+/5 5/5 5/5  Wrist flexion 5/5 5/5 5/5 5/5 5/5 5/5  Wrist extension 5/5 5/5 5/5 5/5 5/5 5/5  Wrist ulnar deviation 5/5 4/5 5/5 5/5 5/5 5/5  Wrist radial deviation 5/5 4/5 5/5 5/5 5/5 5/5  Wrist pronation 5/5 5/5 5/5 5/5 5/5 5/5  Wrist supination 5/5 5/5 5/5 5/5 5/5 5/5  (Blank rows = not tested)  HAND FUNCTION: Grip strength: Right: 28 lbs; Left: 26 lbs, Lateral pinch: Right: 9 lbs, Left: 9 lbs, and 3 point pinch: Right: 8 lbs, Left: 9 lbs 03/13/23: Grip strength: Right: 29 lbs; Left: 28 lbs, Lateral pinch: Right: 10 lbs, Left: 9 lbs, and 3 point pinch: Right: 8 lbs, Left: 8 lbs 04/21/23: Grip strength: Right: 31 lbs; Left: 21 lbs, Lateral pinch: Right: 8 lbs, Left: 8 lbs, and 3  point pinch: Right: 9 lbs, Left: 8 lbs  COORDINATION: 9 Hole Peg test: Right: 24.31 sec; Left: 24.90 sec  SENSATION: Tingling in hands with cold; numb feeling in left foot  EDEMA: generalized-mild   TODAY'S TREATMENT:  DATE:   04/21/23 -A/ROM: flexion, abduction, protraction, horizontal abduction, er/IR, x10 -Digit ROM: composite flexion, abduction, opposition, finger taps, x10 -Wrist ROM: flexion, extension, ulnar/radial deviation, supination/pronation, x10 -Measurements for Reassesssment  04/07/23 -Paraffin: 10' bilateral hands -Theraband strengthening: red band-protraction, flexion, abduction, er, horizontal abduction, 10 reps -ABC writing: each UE, positioned at 90 degrees flexion; 1 rest break for RUE at R -Hand gripper: left hand-gripper at 29#, all large, medium, and small beads with gripper horizontal; right hand-gripper at 29#, all large, medium, and small beads with gripper horizontal -Pt using red clothespin and 3 point pinch with left hand to grasp and stack 3 towers of 5 sponges. Pt then using right hand and 3 point pinch to replace sponges into the bucket.   03/28/23 -Paraffin: 10' bilateral hands -Therapy ball strengthening: using basketball-chest press, flexion, overhead press, circles each direction, 10 reps each -ABC writing: each UE, positioned at 90 degrees flexion -Hand gripper: left hand-gripper at 25#, all large beads with gripper vertical, and medium beads with gripper vertical and horizontal; right hand-gripper at 25#, all large beads with gripper vertical, and medium beads with gripper vertical and horizontal; small beads at 25# with gripper horizontal -Theraputty: red-pinching around border of putty with 3 point pinch, bilateral hands; rolling and then pinching along the roll with lateral pinch   PATIENT EDUCATION: Education details:  reviewed HEP Person educated: Patient and Child(ren) Education method: Explanation, Demonstration, and Handouts Education comprehension: verbalized understanding and returned demonstration  HOME EXERCISE PROGRAM: Eval: red theraputty grip and pinch exercises.  8/16: shoulder stretches 8/26: Shoulder A/ROM 9/16: energy conservation  GOALS: Goals reviewed with patient? Yes  SHORT TERM GOALS: Target date: 02/20/23  Pt will be provided with and educated on HEP to maintain mobility required for independence in ADL completion.   Goal status: MET   LONG TERM GOALS: Target date: 03/13/23  Pt will be educated on AE and DME available for use during ADLs to maintain safety and independence during completion.   Goal status:  MET  2.  Pt will increase BUE strength to 5/5 to improve ability to perform lightweight lifting tasks during ADLs and housework completion.    Goal status:  MET  3.  Pt will increase bilateral grip strength by 15# and pinch strength by 3# to improve ability to grasp and hold pots and pans when cooking.   Goal status: NOT MET  4.  Pt will be educated on pain management techniques to improve independence and comfort during ADL tasks.   Goal status: MET  5.  Pt will increase BUE shoulder A/ROM by 10 degrees to improve ability to reach back and brush her hair or scratch her back.   Goal status:  MET  6.  Pt will be educated on energy conservation strategies to improve activity tolerance required for functional task completion.   Goal status:  MET  ASSESSMENT:  CLINICAL IMPRESSION: Pt continues to report that she is improving with functional activities at home and work as able. She still has bad days where she is unable to do much, but her good days are better. Pt has been provided with extensive education on AE and pain relief techniques, as well as exercises for improving and maintaining her strength and mobility. Pt has no further skilled OT needs at this time  and will be discharged from OT.   PERFORMANCE DEFICITS: in functional skills including ADLs, IADLs, coordination, dexterity, edema, ROM, strength, pain, fascial restrictions, and UE functional use  PLAN:  OT FREQUENCY: 2x/week   OT DURATION: 4 weeks  PLANNED INTERVENTIONS: therapeutic exercise, therapeutic activity, manual therapy, passive range of motion, electrical stimulation, patient/family education, and DME and/or AE instructions  RECOMMENDED OTHER SERVICES: Dietician  CONSULTED AND AGREED WITH PLAN OF CARE: Patient  PLAN FOR NEXT SESSION:  Discharge   Trish Mage, OTR/L  [phone] 04/23/2023, 1:46 PM

## 2023-04-27 NOTE — Progress Notes (Signed)
Erin Higgins,  Your stool test was negative for the presence of any infections.  Specifically, no giardiasis was noted (which was apparently noted on your stool test obtained through the vet's office).  Will see you for your upcoming procedures.

## 2023-04-28 DIAGNOSIS — B719 Cestode infection, unspecified: Secondary | ICD-10-CM

## 2023-04-28 HISTORY — DX: Cestode infection, unspecified: B71.9

## 2023-05-04 ENCOUNTER — Encounter: Payer: Self-pay | Admitting: Gastroenterology

## 2023-05-13 ENCOUNTER — Encounter: Payer: Self-pay | Admitting: Gastroenterology

## 2023-05-15 ENCOUNTER — Other Ambulatory Visit: Payer: Self-pay

## 2023-05-15 MED ORDER — NA SULFATE-K SULFATE-MG SULF 17.5-3.13-1.6 GM/177ML PO SOLN: ORAL | 0 refills | Status: DC

## 2023-05-16 ENCOUNTER — Encounter: Payer: Self-pay | Admitting: Gastroenterology

## 2023-05-16 ENCOUNTER — Ambulatory Visit (AMBULATORY_SURGERY_CENTER): Payer: 59 | Admitting: Gastroenterology

## 2023-05-16 VITALS — BP 102/64 | HR 73 | Temp 97.3°F | Resp 24 | Ht 64.0 in | Wt 125.0 lb

## 2023-05-16 DIAGNOSIS — D122 Benign neoplasm of ascending colon: Secondary | ICD-10-CM

## 2023-05-16 DIAGNOSIS — K3189 Other diseases of stomach and duodenum: Secondary | ICD-10-CM

## 2023-05-16 DIAGNOSIS — Z1211 Encounter for screening for malignant neoplasm of colon: Secondary | ICD-10-CM

## 2023-05-16 DIAGNOSIS — R634 Abnormal weight loss: Secondary | ICD-10-CM | POA: Diagnosis not present

## 2023-05-16 DIAGNOSIS — K449 Diaphragmatic hernia without obstruction or gangrene: Secondary | ICD-10-CM | POA: Diagnosis not present

## 2023-05-16 DIAGNOSIS — B9681 Helicobacter pylori [H. pylori] as the cause of diseases classified elsewhere: Secondary | ICD-10-CM

## 2023-05-16 DIAGNOSIS — K5730 Diverticulosis of large intestine without perforation or abscess without bleeding: Secondary | ICD-10-CM

## 2023-05-16 DIAGNOSIS — K2940 Chronic atrophic gastritis without bleeding: Secondary | ICD-10-CM

## 2023-05-16 DIAGNOSIS — K2950 Unspecified chronic gastritis without bleeding: Secondary | ICD-10-CM | POA: Diagnosis not present

## 2023-05-16 DIAGNOSIS — R197 Diarrhea, unspecified: Secondary | ICD-10-CM

## 2023-05-16 DIAGNOSIS — R1310 Dysphagia, unspecified: Secondary | ICD-10-CM | POA: Diagnosis not present

## 2023-05-16 DIAGNOSIS — K635 Polyp of colon: Secondary | ICD-10-CM | POA: Diagnosis not present

## 2023-05-16 DIAGNOSIS — A6009 Herpesviral infection of other urogenital tract: Secondary | ICD-10-CM | POA: Insufficient documentation

## 2023-05-16 DIAGNOSIS — K2980 Duodenitis without bleeding: Secondary | ICD-10-CM | POA: Diagnosis not present

## 2023-05-16 MED ORDER — SODIUM CHLORIDE 0.9 % IV SOLN: 500.0000 mL | Freq: Once | INTRAVENOUS | Status: DC

## 2023-05-16 NOTE — Op Note (Signed)
[facility] Patient Name: Erin Higgins Procedure Date: 05/16/2023 3:15 PM MRN: [id number] Endoscopist: Lorin Picket E. Tomasa Rand , MD, [id number] Age: 52 Referring MD:  Date of Birth: September 22, 1970 Gender: Female Account #: [id number] Procedure:                Upper GI endoscopy Indications:              Dysphagia, Suspected gastro-esophageal reflux                            disease, Weight loss Medicines:                Monitored Anesthesia Care Procedure:                Pre-Anesthesia Assessment:                           - Prior to the procedure, a History and Physical                            was performed, and patient medications and                            allergies were reviewed. The patient's tolerance of                            previous anesthesia was also reviewed. The risks                            and benefits of the procedure and the sedation                            options and risks were discussed with the patient.                            All questions were answered, and informed consent                            was obtained. Prior Anticoagulants: The patient has                            taken no anticoagulant or antiplatelet agents. ASA                            Grade Assessment: III - A patient with severe                            systemic disease. After reviewing the risks and                            benefits, the patient was deemed in satisfactory                            condition to undergo the procedure.  After obtaining informed consent, the endoscope was                            passed under direct vision. Throughout the                            procedure, the patient's blood pressure, pulse, and                            oxygen saturations were monitored continuously. The                            GIF [id number] #[id number] was introduced through the                            mouth, and advanced to the  second part of duodenum.                            The upper GI endoscopy was accomplished without                            difficulty. The patient tolerated the procedure                            well. Scope In: Scope Out: Findings:                 The examined esophagus was normal. Of note, no                            peristaltic activity was noted.                           A 3 cm hiatal hernia was present.                           Diffuse atrophic mucosa was found in the gastric                            body. Biopsies were taken with a cold forceps for                            Helicobacter pylori testing. Estimated blood loss                            was minimal.                           The exam of the stomach was otherwise normal.                           The examined duodenum was normal. Biopsies for                            histology were taken with a cold forceps for  evaluation of celiac disease. Estimated blood loss                            was minimal. Complications:            No immediate complications. Estimated Blood Loss:     Estimated blood loss was minimal. Impression:               - Normal esophagus, but no peristalsis noted                           - 3 cm hiatal hernia.                           - Gastric mucosal atrophy. Biopsied.                           - Normal examined duodenum. Biopsied. Recommendation:           - Patient has a contact number available for                            emergencies. The signs and symptoms of potential                            delayed complications were discussed with the                            patient. Return to normal activities tomorrow.                            Written discharge instructions were provided to the                            patient.                           - Resume previous diet.                           - Continue present medications.                            - Await pathology results.                           - Consider esophageal manometry to further evaluate                            suspected esophageal dysmotility. Maheen Cwikla E. Tomasa Rand, MD 05/16/2023 4:01:59 PM This report has been signed electronically.

## 2023-05-16 NOTE — Patient Instructions (Addendum)
Continue present medications. Await pathology results. Consider esophageal manometry to further evaluate suspected esophageal dysmotility. Repeat colonoscopy (date not yet determined) for surveillance based on pathology results   Please read handouts provided                             YOU HAD AN ENDOSCOPIC PROCEDURE TODAY AT THE [facility]:   Refer to the procedure report that was given to you for any specific questions about what was found during the examination.  If the procedure report does not answer your questions, please call your gastroenterologist to clarify.  If you requested that your care partner not be given the details of your procedure findings, then the procedure report has been included in a sealed envelope for you to review at your convenience later.  YOU SHOULD EXPECT: Some feelings of bloating in the abdomen. Passage of more gas than usual.  Walking can help get rid of the air that was put into your GI tract during the procedure and reduce the bloating. If you had a lower endoscopy (such as a colonoscopy or flexible sigmoidoscopy) you may notice spotting of blood in your stool or on the toilet paper. If you underwent a bowel prep for your procedure, you may not have a normal bowel movement for a few days.  Please Note:  You might notice some irritation and congestion in your nose or some drainage.  This is from the oxygen used during your procedure.  There is no need for concern and it should clear up in a day or so.  SYMPTOMS TO REPORT IMMEDIATELY:  Following lower endoscopy (colonoscopy or flexible sigmoidoscopy):  Excessive amounts of blood in the stool  Significant tenderness or worsening of abdominal pains  Swelling of the abdomen that is new, acute  Fever of 100F or higher  Following upper endoscopy (EGD)  Vomiting of blood or coffee ground material  New chest pain or pain under the shoulder blades  Painful or persistently difficult swallowing  New  shortness of breath  Fever of 100F or higher  Black, tarry-looking stools  For urgent or emergent issues, a gastroenterologist can be reached at any hour by calling [phone]. Do not use MyChart messaging for urgent concerns.    DIET:  We do recommend a small meal at first, but then you may proceed to your regular diet.  Drink plenty of fluids but you should avoid alcoholic beverages for 24 hours.  ACTIVITY:  You should plan to take it easy for the rest of today and you should NOT DRIVE or use heavy machinery until tomorrow (because of the sedation medicines used during the test).    FOLLOW UP: Our staff will call the number listed on your records the next business day following your procedure.  We will call around 7:15- 8:00 am to check on you and address any questions or concerns that you may have regarding the information given to you following your procedure. If we do not reach you, we will leave a message.     If any biopsies were taken you will be contacted by phone or by letter within the next 1-3 weeks.  Please call us at [phone] if you have not heard about the biopsies in 3 weeks.    SIGNATURES/CONFIDENTIALITY: You and/or your care partner have signed paperwork which will be entered into your electronic medical record.  These signatures attest to the fact that that  the information above on your After Visit Summary has been reviewed and is understood.  Full responsibility of the confidentiality of this discharge information lies with you and/or your care-partner.

## 2023-05-16 NOTE — Progress Notes (Signed)
Sedate, gd SR, tolerated procedure well, VSS, report to RN 

## 2023-05-16 NOTE — Progress Notes (Signed)
History and Physical Interval Note:  05/16/2023 3:12 PM  Erin Higgins  has presented today for endoscopic procedure(s), with the diagnosis of  Encounter Diagnoses  Name Primary?   Dysphagia, unspecified type Yes   Weight loss    Colon cancer screening    Herpes simplex of female genitalia   .  The various methods of evaluation and treatment have been discussed with the patient and/or family. After consideration of risks, benefits and other options for treatment, the patient has consented to  the endoscopic procedure(s).   The patient's history has been reviewed, patient examined, no change in status, stable for endoscopic procedure(s).  I have reviewed the patient's chart and labs.  Questions were answered to the patient's satisfaction.     Jaquasia Doscher E. Tomasa Rand, MD [facility]

## 2023-05-16 NOTE — Op Note (Signed)
[facility] Patient Name: Erin Higgins Procedure Date: 05/16/2023 3:12 PM MRN: [id number] Endoscopist: Lorin Picket E. Tomasa Rand , MD, [id number] Age: 52 Referring MD:  Date of Birth: April 11, 1971 Gender: Female Account #: [id number] Procedure:                Colonoscopy Indications:              Chronic diarrhea, Weight loss Medicines:                Monitored Anesthesia Care Procedure:                Pre-Anesthesia Assessment:                           - Prior to the procedure, a History and Physical                            was performed, and patient medications and                            allergies were reviewed. The patient's tolerance of                            previous anesthesia was also reviewed. The risks                            and benefits of the procedure and the sedation                            options and risks were discussed with the patient.                            All questions were answered, and informed consent                            was obtained. Prior Anticoagulants: The patient has                            taken no anticoagulant or antiplatelet agents. ASA                            Grade Assessment: III - A patient with severe                            systemic disease. After reviewing the risks and                            benefits, the patient was deemed in satisfactory                            condition to undergo the procedure.                           After obtaining informed consent, the colonoscope  was passed under direct vision. Throughout the                            procedure, the patient's blood pressure, pulse, and                            oxygen saturations were monitored continuously. The                            [id number]-HQ190L Colonoscope [id number] was introduced                            through the anus and advanced to the the terminal                            ileum, with  identification of the appendiceal                            orifice and IC valve. The colonoscopy was performed                            without difficulty. The patient tolerated the                            procedure well. The quality of the bowel                            preparation was adequate. The terminal ileum,                            ileocecal valve, appendiceal orifice, and rectum                            were photographed. The bowel preparation used was                            SUPREP via split dose instruction. Scope In: 3:34:13 PM Scope Out: 3:53:55 PM Scope Withdrawal Time: 0 hours 12 minutes 53 seconds  Total Procedure Duration: 0 hours 19 minutes 42 seconds  Findings:                 The perianal and digital rectal examinations were                            normal. Pertinent negatives include normal                            sphincter tone and no palpable rectal lesions.                           A 3 mm polyp was found in the ascending colon. The                            polyp was sessile. The polyp was removed with  a                            cold snare. Resection and retrieval were complete.                            Estimated blood loss was minimal.                           Multiple large-mouthed and small-mouthed                            diverticula were found in the sigmoid colon,                            descending colon and transverse colon. There was no                            evidence of diverticular bleeding.                           Normal mucosa was found in the entire colon.                            Biopsies for histology were taken with a cold                            forceps from the ascending colon, transverse colon,                            descending colon and sigmoid colon for evaluation                            of microscopic colitis. Estimated blood loss was                            minimal.                            The terminal ileum appeared normal.                           The retroflexed view of the distal rectum and anal                            verge was normal and showed no anal or rectal                            abnormalities. Complications:            No immediate complications. Estimated Blood Loss:     Estimated blood loss was minimal. Impression:               - One 3 mm polyp in the ascending colon, removed  with a cold snare. Resected and retrieved.                           - Moderate diverticulosis in the sigmoid colon, in                            the descending colon and in the transverse colon.                            There was no evidence of diverticular bleeding.                           - Normal mucosa in the entire examined colon.                            Biopsied.                           - The examined portion of the ileum was normal.                           - The distal rectum and anal verge are normal on                            retroflexion view. Recommendation:           - Patient has a contact number available for                            emergencies. The signs and symptoms of potential                            delayed complications were discussed with the                            patient. Return to normal activities tomorrow.                            Written discharge instructions were provided to the                            patient.                           - Resume previous diet.                           - Continue present medications.                           - Await pathology results.                           - Repeat colonoscopy (date not yet determined) for  surveillance based on pathology results. Joanmarie Tsang E. Tomasa Rand, MD 05/16/2023 4:07:08 PM This report has been signed electronically.

## 2023-05-16 NOTE — Progress Notes (Signed)
Pt's states no medical or surgical changes since previsit or office visit. 

## 2023-05-16 NOTE — Progress Notes (Signed)
Called to room to assist during endoscopic procedure.  Patient ID and intended procedure confirmed with present staff. Received instructions for my participation in the procedure from the performing physician.  

## 2023-05-17 ENCOUNTER — Telehealth: Payer: Self-pay

## 2023-05-17 NOTE — Telephone Encounter (Signed)
LMOM

## 2023-05-19 LAB — SURGICAL PATHOLOGY

## 2023-05-22 ENCOUNTER — Encounter: Payer: Self-pay | Admitting: Gastroenterology

## 2023-05-22 ENCOUNTER — Other Ambulatory Visit: Payer: Self-pay

## 2023-05-22 DIAGNOSIS — A048 Other specified bacterial intestinal infections: Secondary | ICD-10-CM

## 2023-05-22 MED ORDER — METRONIDAZOLE 250 MG PO TABS: 250.0000 mg | ORAL_TABLET | Freq: Four times a day (QID) | ORAL | 0 refills | Status: DC

## 2023-05-22 MED ORDER — DOXYCYCLINE HYCLATE 100 MG PO CAPS: 100.0000 mg | ORAL_CAPSULE | Freq: Two times a day (BID) | ORAL | 0 refills | Status: DC

## 2023-05-22 MED ORDER — OMEPRAZOLE 20 MG PO CPDR: 20.0000 mg | DELAYED_RELEASE_CAPSULE | Freq: Two times a day (BID) | ORAL | 0 refills | Status: DC

## 2023-05-22 MED ORDER — BISMUTH 262 MG PO CHEW: 524.0000 mg | CHEWABLE_TABLET | Freq: Four times a day (QID) | ORAL | 0 refills | Status: DC

## 2023-05-22 NOTE — Progress Notes (Signed)
Ms. Grajeda,  The biopsies taken from you duodenum were unremarkable, with no evidence of celiac disease.  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  This is a common bacteria that can cause chronic GI symptoms, increase the risk of stomach ulcers and stomach cancer, and thus needs to be eradicated.  Will plan on treating with quad therapy as below. Please confirm no medication allergies to the prescribed regimen.   1) Omeprazole 20 mg 2 times a day x 14 d 2) Pepto Bismol 2 tabs (262 mg each) 4 times a day x 14 d 3) Metronidazole 250 mg 4 times a day x 14 d 4) doxycycline 100 mg 2 times a day x 14 d  After 14 days, ok to stop omeprazole.  4 weeks after treatment completed, check H. Pylori stool antigen to confirm eradication (must be off acid suppression therapy for 2 weeks prior to specimen submission)  The biopsies taken from your colon were normal and there was no evidence of Microscopic Colitis or chronic inflammatory changes.    The polyp which I removed from your colon was proven to be completely benign but is considered a "pre-cancerous" polyp that MAY have grown into cancer if it had not been removed.  Studies shows that at least 20% of women over age 17 and 30% of men over age 56 have pre-cancerous polyps.  Based on current nationally recognized surveillance guidelines, I recommend that you have a repeat colonoscopy in 7 years.   If you develop any new rectal bleeding, abdominal pain or significant bowel habit changes, please contact me before then.  Bonita Quin,  Can you please place the orders for the H. Pylori treatment and stool antigen?

## 2023-05-23 ENCOUNTER — Other Ambulatory Visit: Payer: Self-pay

## 2023-05-23 ENCOUNTER — Encounter: Payer: Self-pay | Admitting: Internal Medicine

## 2023-05-23 ENCOUNTER — Other Ambulatory Visit: Payer: Self-pay | Admitting: Internal Medicine

## 2023-05-23 DIAGNOSIS — M349 Systemic sclerosis, unspecified: Secondary | ICD-10-CM

## 2023-05-23 NOTE — Telephone Encounter (Signed)
Patient contacted the office and states she has switched her pharmacy to [facility] in [place]. Patient states she would like her methotrexate to be refilled there. A refill of methotrexate was sent on 03/28/2023 to [facility] in [place] for a three month supply. Patient should have one month left, worked up the prescription for a one month supply. Please review the prescription and sign. Once signed, will contact [facility] to cancel the prescription there.

## 2023-05-31 ENCOUNTER — Ambulatory Visit: Payer: 59 | Admitting: Internal Medicine

## 2023-06-02 MED ORDER — METHOTREXATE SODIUM 2.5 MG PO TABS: 15.0000 mg | ORAL_TABLET | ORAL | 0 refills | Status: DC
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
## 2023-06-21 ENCOUNTER — Other Ambulatory Visit: Payer: Self-pay | Admitting: Internal Medicine

## 2023-06-21 DIAGNOSIS — M349 Systemic sclerosis, unspecified: Secondary | ICD-10-CM

## 2023-06-22 ENCOUNTER — Ambulatory Visit: Payer: 59 | Attending: Internal Medicine | Admitting: Internal Medicine

## 2023-06-22 ENCOUNTER — Encounter: Payer: Self-pay | Admitting: Internal Medicine

## 2023-06-22 VITALS — BP 104/71 | HR 74 | Resp 14 | Ht 64.0 in | Wt 119.0 lb

## 2023-06-22 DIAGNOSIS — I7300 Raynaud's syndrome without gangrene: Secondary | ICD-10-CM | POA: Diagnosis not present

## 2023-06-22 DIAGNOSIS — R634 Abnormal weight loss: Secondary | ICD-10-CM | POA: Insufficient documentation

## 2023-06-22 DIAGNOSIS — Z79899 Other long term (current) drug therapy: Secondary | ICD-10-CM | POA: Diagnosis not present

## 2023-06-22 DIAGNOSIS — M25552 Pain in left hip: Secondary | ICD-10-CM | POA: Insufficient documentation

## 2023-06-22 DIAGNOSIS — H539 Unspecified visual disturbance: Secondary | ICD-10-CM | POA: Insufficient documentation

## 2023-06-22 DIAGNOSIS — M349 Systemic sclerosis, unspecified: Secondary | ICD-10-CM | POA: Insufficient documentation

## 2023-06-22 DIAGNOSIS — L943 Sclerodactyly: Secondary | ICD-10-CM | POA: Insufficient documentation

## 2023-06-22 NOTE — Telephone Encounter (Signed)
Discussed at office appointment.

## 2023-06-22 NOTE — Telephone Encounter (Signed)
Last Fill: 06/02/2023  Labs: 03/01/2023 Sedimentation rate is increased at 29 up form 17 but CRP improved to 5.4 from 16. I am not sure but this probably reflects a decrease in inflammation activity. There is no problem with blood count, kidney, or liver function from methotrexate so that is fine to continue.   Next Visit: 06/22/2023  Last Visit: 03/01/2023  DX: Systemic sclerosis   Current Dose per office note 03/01/2023: methotrexate 15 mg p.o. weekly   Patient to update labs at upcomming appointment.   Okay to refill Methotrexate?

## 2023-06-23 LAB — CBC WITH DIFFERENTIAL/PLATELET
Absolute Lymphocytes: 1971 {cells}/uL (ref 850–3900)
Absolute Monocytes: 566 {cells}/uL (ref 200–950)
Basophils Absolute: 18 {cells}/uL (ref 0–200)
Basophils Relative: 0.3 %
Eosinophils Absolute: 53 {cells}/uL (ref 15–500)
Eosinophils Relative: 0.9 %
HCT: 34.6 % — ABNORMAL LOW (ref 35.0–45.0)
Hemoglobin: 10.9 g/dL — ABNORMAL LOW (ref 11.7–15.5)
MCH: 27.6 pg (ref 27.0–33.0)
MCHC: 31.5 g/dL — ABNORMAL LOW (ref 32.0–36.0)
MCV: 87.6 fL (ref 80.0–100.0)
MPV: 10.5 fL (ref 7.5–12.5)
Monocytes Relative: 9.6 %
Neutro Abs: 3292 {cells}/uL (ref 1500–7800)
Neutrophils Relative %: 55.8 %
Platelets: 365 10*3/uL (ref 140–400)
RBC: 3.95 10*6/uL (ref 3.80–5.10)
RDW: 13.9 % (ref 11.0–15.0)
Total Lymphocyte: 33.4 %
WBC: 5.9 10*3/uL (ref 3.8–10.8)

## 2023-06-23 LAB — COMPLETE METABOLIC PANEL WITH GFR
AG Ratio: 1.1 (calc) (ref 1.0–2.5)
ALT: 9 U/L (ref 6–29)
AST: 18 U/L (ref 10–35)
Albumin: 3.8 g/dL (ref 3.6–5.1)
Alkaline phosphatase (APISO): 46 U/L (ref 37–153)
BUN/Creatinine Ratio: 26 (calc) — ABNORMAL HIGH (ref 6–22)
BUN: 11 mg/dL (ref 7–25)
CO2: 25 mmol/L (ref 20–32)
Calcium: 9.4 mg/dL (ref 8.6–10.4)
Chloride: 105 mmol/L (ref 98–110)
Creat: 0.43 mg/dL — ABNORMAL LOW (ref 0.50–1.03)
Globulin: 3.5 g/dL (ref 1.9–3.7)
Glucose, Bld: 91 mg/dL (ref 65–99)
Potassium: 4 mmol/L (ref 3.5–5.3)
Sodium: 137 mmol/L (ref 135–146)
Total Bilirubin: 0.4 mg/dL (ref 0.2–1.2)
Total Protein: 7.3 g/dL (ref 6.1–8.1)
eGFR: 118 mL/min/{1.73_m2} (ref >=60)

## 2023-06-23 LAB — C-REACTIVE PROTEIN: CRP: 8 mg/L — ABNORMAL HIGH (ref <8.0)

## 2023-06-23 LAB — SEDIMENTATION RATE: Sed Rate: 38 mm/h — ABNORMAL HIGH (ref 0–20)

## 2023-06-23 NOTE — Progress Notes (Signed)
Sed rate is high at 38 up from 29 last time.  CRP is also high at 8.0 up from 5.4 last time.  Blood count and kidney and liver function are fine.  Recommend she increase the dose of methotrexate to 20 mg (8 tablets) weekly.  If it easier for her, she can take this split up as 4 tablets twice in the day, still 1 day out of the week.

## 2023-06-26 ENCOUNTER — Telehealth: Payer: Self-pay

## 2023-06-26 NOTE — Telephone Encounter (Signed)
Patient contacted the office stating more labs resulted and inquired if there were any new comments than what she has been told on Friday. Advised the patient all four labs were gone over on Friday and that we have not heard anything new. Patient verbalized understanding.

## 2023-07-04 ENCOUNTER — Other Ambulatory Visit: Payer: Self-pay | Admitting: Internal Medicine

## 2023-07-04 DIAGNOSIS — M349 Systemic sclerosis, unspecified: Secondary | ICD-10-CM

## 2023-07-12 ENCOUNTER — Other Ambulatory Visit: Payer: 59

## 2023-07-12 DIAGNOSIS — A048 Other specified bacterial intestinal infections: Secondary | ICD-10-CM

## 2023-07-14 ENCOUNTER — Encounter: Payer: Self-pay | Admitting: Internal Medicine

## 2023-07-14 LAB — H. PYLORI ANTIGEN, STOOL: H pylori Ag, Stl: NEGATIVE

## 2023-07-18 ENCOUNTER — Ambulatory Visit
Admission: EM | Admit: 2023-07-18 | Discharge: 2023-07-18 | Disposition: A | Discharge status: Home / Self Care | Payer: 59 | Attending: Family Medicine | Admitting: Family Medicine

## 2023-07-18 DIAGNOSIS — M25511 Pain in right shoulder: Secondary | ICD-10-CM | POA: Diagnosis not present

## 2023-07-18 DIAGNOSIS — M25551 Pain in right hip: Secondary | ICD-10-CM | POA: Diagnosis not present

## 2023-07-18 DIAGNOSIS — M79604 Pain in right leg: Secondary | ICD-10-CM | POA: Diagnosis not present

## 2023-07-18 DIAGNOSIS — W19XXXA Unspecified fall, initial encounter: Secondary | ICD-10-CM | POA: Diagnosis not present

## 2023-07-18 MED ORDER — TIZANIDINE HCL 4 MG PO CAPS
4.0000 mg | ORAL_CAPSULE | Freq: Three times a day (TID) | ORAL | 0 refills | Status: DC | PRN
Start: 2023-07-18 — End: 2024-03-16

## 2023-07-18 NOTE — Discharge Instructions (Signed)
Your exam is very reassuring today.  You would likely be stiff and very sore for the next few days but slowly improve over time.  I have sent over a muscle relaxer and you may also do over-the-counter pain relievers, heat, massage, rest.  Follow-up for significantly worsening symptoms.

## 2023-07-18 NOTE — ED Provider Notes (Signed)
[facility]    CSN: [id number] Arrival date & time: 07/18/23  1649      History   Chief Complaint No chief complaint on file.   HPI Erin Higgins is a 53 y.o. female.   Patient presenting today with right posterior hip pain, right shoulder pain and right lower leg pain after a fall onto some bricks earlier today.  Denies decreased range of motion, skin injury, bruising or swelling, head injury, loss of consciousness, dizziness.  So for not trying anything over-the-counter for symptoms.    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   Vitamin D deficiency 12/19/2022   Rash and other nonspecific skin eruption 12/19/2022   Chest pain of uncertain etiology 08/08/2022   Raynaud phenomenon 08/08/2022   Encounter for sterilization 05/03/2012    Past Surgical History:  Procedure Laterality Date   COLONOSCOPY     DILATION AND CURETTAGE OF UTERUS     TUBAL LIGATION  05/03/2012   Procedure: POST PARTUM TUBAL LIGATION;  Surgeon: Willodean Rosenthal, MD;  Location: [facility];  Service: Gynecology;  Laterality: Bilateral;  with filshie clips    OB History     Gravida  3   Para  2   Term  2   Preterm      AB  1   Living  2      SAB  1   IAB      Ectopic      Multiple      Live Births  1            Home Medications    Prior to Admission medications   Medication Sig Start Date End Date Taking? Authorizing Provider  Bismuth 262 MG CHEW Chew 524 mg by mouth in the morning, at noon, in the evening, and at bedtime. Patient not taking:  Reported on 06/22/2023 05/22/23   Jenel Lucks, MD  doxycycline (VIBRAMYCIN) 100 MG capsule Take 1 capsule (100 mg total) by mouth 2 (two) times daily. Patient not taking: Reported on 06/22/2023 05/22/23   Jenel Lucks, MD  metroNIDAZOLE (FLAGYL) 250 MG tablet Take 1 tablet (250 mg total) by mouth 4 (four) times daily. Patient not taking: Reported on 06/22/2023 05/22/23   Jenel Lucks, MD  omeprazole (PRILOSEC) 20 MG capsule Take 1 capsule (20 mg total) by mouth 2 (two) times daily before a meal. Patient not taking: Reported on 06/22/2023 05/22/23   Jenel Lucks, MD  tiZANidine (ZANAFLEX) 4 MG capsule Take 1 capsule (4 mg total) by mouth 3 (three) times daily as needed for muscle spasms. Do not drink alcohol or drive while taking this medication.  May cause drowsiness. 07/18/23  Yes Particia Nearing, PA-C  amLODipine (NORVASC) 2.5 MG tablet TAKE 1 TABLET(2.5 MG) BY MOUTH DAILY 10/14/22   Mallipeddi, Vishnu P, MD  B Complex Vitamins (B COMPLEX VITAMIN PO) Take by mouth. liquid    [provider]  Black Pepper-Turmeric (TURMERIC COMPLEX/BLACK PEPPER PO) Take by mouth. liquid  [provider]  cyclobenzaprine (FLEXERIL) 5 MG tablet Take 1 tablet (5 mg total) by mouth at bedtime as needed for muscle spasms. Patient not taking: Reported on 06/22/2023 03/01/23   Fuller Plan, MD  folic acid (FOLVITE) 1 MG tablet Take 1 tablet (1 mg total) by mouth daily. 04/18/23   Rice, Jamesetta Orleans, MD  ibuprofen (ADVIL) 600 MG tablet Take 1 tablet (600 mg total) by mouth 3 (three) times daily. Patient not taking: Reported on 06/22/2023 07/12/22   Burgess Amor, PA-C  ibuprofen (ADVIL) 800 MG tablet Take 800 mg by mouth. 12/05/22   [provider]  liothyronine (CYTOMEL) 5 MCG tablet Take 5 mcg by mouth daily.    [provider]  methotrexate (RHEUMATREX) 2.5 MG tablet Take 8 tablets (20 mg total) by mouth once a week. 06/23/23   Fuller Plan, MD  Na Sulfate-K Sulfate-Mg Sulf 17.5-3.13-1.6 GM/177ML SOLN Take as directed Patient not taking: Reported on 06/22/2023 05/15/23   Jenel Lucks, MD  NON FORMULARY Lymphatic drops liquid Patient not taking: Reported on 06/22/2023    [provider]  OVER THE COUNTER MEDICATION Take by mouth daily. Pt states that she takes 2 vitamin B12 gummies daily.    [provider]  UNABLE TO FIND Med Name: vegetable capsules Patient not taking: Reported on 06/22/2023    [provider]  UNABLE TO FIND Med Name: Bone Health Patient not taking: Reported on 06/22/2023    [provider]  Vitamin D-Vitamin K (VITAMIN K2-VITAMIN D3 PO) Take by mouth. liquid    [provider]    Family History Family History  Problem Relation Age of Onset   Hyperlipidemia Mother    Hypertension Mother    Diabetes type II Mother    Sleep apnea Mother    Breast cancer Mother    Hypertension Father    Ulcerative colitis Father    Vitiligo Paternal Great-grandmother    Lupus Paternal Aunt     Social History Social History   Tobacco Use   Smoking status: Never    Passive exposure: Never   Smokeless tobacco: Never  Vaping Use   Vaping status: Never Used  Substance Use Topics   Alcohol use: Not Currently    Alcohol/week: 1.0 standard drink of alcohol    Types: 1 Glasses of wine per week    Comment: rarely   Drug use: No     Allergies   Penicillins and Latex   Review of Systems Review of Systems Per HPI  Physical Exam Triage Vital Signs ED Triage Vitals  Encounter Vitals Group     BP 07/18/23 1654 113/74     Systolic BP Percentile --      Diastolic BP Percentile --      Pulse Rate 07/18/23 1654 83     Resp 07/18/23 1654 20     Temp 07/18/23 1654 97.8 F (36.6 C)     Temp Source 07/18/23 1654 Oral     SpO2 07/18/23 1654 97 %     Weight --      Height --      Head Circumference --      Peak Flow --      Pain Score 07/18/23 1656 6      Pain Loc --      Pain Education --      Exclude from Growth Chart --    No data found.  Updated Vital Signs BP 113/74 (BP Location: Right  Arm)   Pulse 83   Temp 97.8 F (36.6 C) (Oral)   Resp 20   SpO2 97%   Visual Acuity Right Eye Distance:   Left Eye Distance:   Bilateral Distance:    Right Eye Near:   Left Eye Near:    Bilateral Near:     Physical Exam Vitals and nursing note reviewed.  Constitutional:      Appearance: Normal appearance. She is not ill-appearing.  HENT:     Head: Atraumatic.  Eyes:     Extraocular Movements: Extraocular movements intact.     Conjunctiva/sclera: Conjunctivae normal.  Cardiovascular:     Rate and Rhythm: Normal rate and regular rhythm.     Heart sounds: Normal heart sounds.  Pulmonary:     Effort: Pulmonary effort is normal.     Breath sounds: Normal breath sounds.  Musculoskeletal:        General: Swelling, tenderness and signs of injury present. No deformity. Normal range of motion.     Cervical back: Normal range of motion and neck supple.     Comments: Tender to palpation to the right shoulder posteriorly, posterior right hip, right lower leg with no bruising or bony deformity palpable.  Range of motion intact, normal gait.  No midline spinal tenderness to palpation diffusely.  Skin:    General: Skin is warm and dry.     Findings: No bruising or erythema.  Neurological:     Mental Status: She is alert and oriented to person, place, and time.     Motor: No weakness.     Gait: Gait normal.     Comments: All 4 extremities neurovascularly intact  Psychiatric:        Mood and Affect: Mood normal.        Thought Content: Thought content normal.        Judgment: Judgment normal.      UC Treatments / Results  Labs (all labs ordered are listed, but only abnormal results are displayed) Labs Reviewed - No data to display  EKG   Radiology No results found.  Procedures Procedures (including critical care  time)  Medications Ordered in UC Medications - No data to display  Initial Impression / Assessment and Plan / UC Course  I have reviewed the triage vital signs and the nursing notes.  Pertinent labs & imaging results that were available during my care of the patient were reviewed by me and considered in my medical decision making (see chart for details).     Very low suspicion for bony abnormality, x-ray imaging deferred with shared decision making.  Will treat for muscular pain with Zanaflex, over-the-counter pain relievers, heat, massage, rest.  Return for worsening symptoms.  Final Clinical Impressions(s) / UC Diagnoses   Final diagnoses:  Acute pain of right shoulder  Right hip pain  Right leg pain  Fall, initial encounter     Discharge Instructions      Your exam is very reassuring today.  You would likely be stiff and very sore for the next few days but slowly improve over time.  I have sent over a muscle relaxer and you may also do over-the-counter pain relievers, heat, massage, rest.  Follow-up for significantly worsening symptoms.    ED Prescriptions     Medication Sig Dispense Auth. Provider   tiZANidine (ZANAFLEX) 4 MG capsule Take 1 capsule (4 mg total) by mouth 3 (three) times daily as needed for muscle spasms. Do not drink alcohol or drive  while taking this medication.  May cause drowsiness. 15 capsule Particia Nearing, [place]      PDMP not reviewed this encounter.   Particia Nearing, [place] 07/18/23 1923

## 2023-07-18 NOTE — ED Triage Notes (Signed)
Pt reports he dog knocked her down and she fell on her right side onto some bricks about an hour ago. Has some right hip pain (dent in hip) and right shoulder pain.

## 2023-07-19 ENCOUNTER — Encounter: Payer: Self-pay | Admitting: Gastroenterology

## 2023-07-19 NOTE — Progress Notes (Signed)
Ms. Erin Higgins,  The test for H. pylori was negative, indicating that the antibiotics successfully eradicated the bacteria.  No further evaluation or treatment needed for H. pylori infection.  Please follow-up with me as needed in the office for further evaluation and management of any chronic GI symptoms.

## 2023-07-24 ENCOUNTER — Encounter: Payer: Self-pay | Admitting: Internal Medicine

## 2023-07-24 ENCOUNTER — Encounter: Payer: Self-pay | Admitting: Gastroenterology

## 2023-08-26 HISTORY — PX: TOOTH EXTRACTION: SHX859
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
## 2023-09-18 ENCOUNTER — Other Ambulatory Visit: Payer: Self-pay | Admitting: Internal Medicine

## 2023-09-18 DIAGNOSIS — M349 Systemic sclerosis, unspecified: Secondary | ICD-10-CM

## 2023-09-21 ENCOUNTER — Ambulatory Visit: Payer: 59 | Attending: Internal Medicine | Admitting: Internal Medicine

## 2023-09-21 ENCOUNTER — Encounter: Payer: Self-pay | Admitting: *Deleted

## 2023-09-21 ENCOUNTER — Encounter: Payer: Self-pay | Admitting: Internal Medicine

## 2023-09-21 ENCOUNTER — Other Ambulatory Visit: Payer: Self-pay

## 2023-09-21 VITALS — BP 113/72 | HR 76 | Resp 14 | Ht 65.0 in | Wt 117.0 lb

## 2023-09-21 DIAGNOSIS — Z79899 Other long term (current) drug therapy: Secondary | ICD-10-CM

## 2023-09-21 DIAGNOSIS — M349 Systemic sclerosis, unspecified: Secondary | ICD-10-CM

## 2023-09-21 DIAGNOSIS — I7300 Raynaud's syndrome without gangrene: Secondary | ICD-10-CM

## 2023-09-21 DIAGNOSIS — L943 Sclerodactyly: Secondary | ICD-10-CM | POA: Insufficient documentation

## 2023-09-21 DIAGNOSIS — R634 Abnormal weight loss: Secondary | ICD-10-CM

## 2023-09-21 DIAGNOSIS — K1379 Other lesions of oral mucosa: Secondary | ICD-10-CM

## 2023-09-21 DIAGNOSIS — M25552 Pain in left hip: Secondary | ICD-10-CM

## 2023-09-21 DIAGNOSIS — H539 Unspecified visual disturbance: Secondary | ICD-10-CM

## 2023-09-21 MED ORDER — METHOTREXATE SODIUM 2.5 MG PO TABS: 20.0000 mg | ORAL_TABLET | ORAL | 0 refills | Status: DC

## 2023-09-21 MED ORDER — FOLIC ACID 1 MG PO TABS: 1.0000 mg | ORAL_TABLET | Freq: Every day | ORAL | 3 refills | Status: DC

## 2023-09-21 MED ORDER — MAGIC MOUTHWASH: ORAL | 0 refills | Status: DC

## 2023-09-21 MED ORDER — LIDOCAINE VISCOUS HCL 2 % MT SOLN: 5.0000 mL | Freq: Three times a day (TID) | OROMUCOSAL | 0 refills | Status: DC | PRN

## 2023-09-21 NOTE — Telephone Encounter (Signed)
 Please review and sign

## 2023-09-21 NOTE — Telephone Encounter (Signed)
 Contacted the [employer] and spoke to a pharmacist named Melissa. Verbalized the magic mouthwash prescription to [facility]. Advised Melissa I would fax the paper prescription over as well. Melissa verbalized understanding.

## 2023-09-22 LAB — LIPID PANEL
Cholesterol: 171 mg/dL (ref <200)
HDL: 50 mg/dL (ref >=50)
LDL Cholesterol (Calc): 102 mg/dL — ABNORMAL HIGH
Non-HDL Cholesterol (Calc): 121 mg/dL (ref <130)
Total CHOL/HDL Ratio: 3.4 (calc) (ref <5.0)
Triglycerides: 96 mg/dL (ref <150)

## 2023-09-22 LAB — CBC WITH DIFFERENTIAL/PLATELET
Absolute Lymphocytes: 1958 {cells}/uL (ref 850–3900)
Absolute Monocytes: 569 {cells}/uL (ref 200–950)
Basophils Absolute: 22 {cells}/uL (ref 0–200)
Basophils Relative: 0.3 %
Eosinophils Absolute: 43 {cells}/uL (ref 15–500)
Eosinophils Relative: 0.6 %
HCT: 35.5 % (ref 35.0–45.0)
Hemoglobin: 11.4 g/dL — ABNORMAL LOW (ref 11.7–15.5)
MCH: 27.9 pg (ref 27.0–33.0)
MCHC: 32.1 g/dL (ref 32.0–36.0)
MCV: 86.8 fL (ref 80.0–100.0)
MPV: 9.8 fL (ref 7.5–12.5)
Monocytes Relative: 7.9 %
Neutro Abs: 4608 {cells}/uL (ref 1500–7800)
Neutrophils Relative %: 64 %
Platelets: 344 10*3/uL (ref 140–400)
RBC: 4.09 10*6/uL (ref 3.80–5.10)
RDW: 14.3 % (ref 11.0–15.0)
Total Lymphocyte: 27.2 %
WBC: 7.2 10*3/uL (ref 3.8–10.8)

## 2023-09-22 LAB — COMPREHENSIVE METABOLIC PANEL WITH GFR
AG Ratio: 1 (calc) (ref 1.0–2.5)
ALT: 8 U/L (ref 6–29)
AST: 18 U/L (ref 10–35)
Albumin: 3.9 g/dL (ref 3.6–5.1)
Alkaline phosphatase (APISO): 51 U/L (ref 37–153)
BUN/Creatinine Ratio: 31 (calc) — ABNORMAL HIGH (ref 6–22)
BUN: 11 mg/dL (ref 7–25)
CO2: 25 mmol/L (ref 20–32)
Calcium: 9.5 mg/dL (ref 8.6–10.4)
Chloride: 104 mmol/L (ref 98–110)
Creat: 0.36 mg/dL — ABNORMAL LOW (ref 0.50–1.03)
Globulin: 4.1 g/dL — ABNORMAL HIGH (ref 1.9–3.7)
Glucose, Bld: 71 mg/dL (ref 65–99)
Potassium: 4 mmol/L (ref 3.5–5.3)
Sodium: 139 mmol/L (ref 135–146)
Total Bilirubin: 0.5 mg/dL (ref 0.2–1.2)
Total Protein: 8 g/dL (ref 6.1–8.1)
eGFR: 124 mL/min/{1.73_m2} (ref >=60)

## 2023-09-22 LAB — SEDIMENTATION RATE: Sed Rate: 31 mm/h — ABNORMAL HIGH (ref 0–20)

## 2023-09-22 NOTE — Progress Notes (Signed)
 Sedimentation rate is partially improved down to 31 from 38 last time.  Cholesterol level is good.  Blood count and kidney and liver function tests were normal.  No problem for continuing methotrexate and can try splitting up the dose like we discussed may improve its effectiveness.

## 2023-10-03 ENCOUNTER — Ambulatory Visit (HOSPITAL_COMMUNITY)

## 2023-10-03 NOTE — Therapy (Incomplete)
 OUTPATIENT PHYSICAL THERAPY LOWER EXTREMITY EVALUATION   Patient Name: Erin Higgins MRN: [id number] DOB:Feb 21, 1971, 53 y.o., female Today's Date: 10/03/2023  END OF SESSION:   Past Medical History:  Diagnosis Date   Diverticulitis    Heartburn    Herpes simplex of female genitalia    Scleredema (HCC)    Tapeworm 04/2023   Past Surgical History:  Procedure Laterality Date   COLONOSCOPY     DILATION AND CURETTAGE OF UTERUS     TUBAL LIGATION  05/03/2012   Procedure: POST PARTUM TUBAL LIGATION;  Surgeon: Willodean Rosenthal, MD;  Location: [facility];  Service: Gynecology;  Laterality: Bilateral;  with filshie clips   Patient Active Problem List   Diagnosis Date Noted   Vision changes 06/22/2023   Herpes simplex of female genitalia    Lateral pain of left hip 03/01/2023   Unintentional weight loss of 10% body weight within 6 months 03/01/2023   Systemic sclerosis (HCC) 01/18/2023   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  Rationale for Evaluation and Treatment: Rehabilitation  ONSET DATE: ***  SUBJECTIVE:   SUBJECTIVE STATEMENT: Pt reports pain rating of ***/10 upon presentation.   PERTINENT HISTORY: *** PAIN:  Are you having pain? {OPRCPAIN:27236}  PRECAUTIONS: {Therapy precautions:24002}  RED FLAGS: {PT Red Flags:29287}   WEIGHT BEARING RESTRICTIONS: {Yes ***/No:24003}  FALLS:  Has patient fallen in last 6 months? {fallsyesno:27318}  LIVING ENVIRONMENT: Lives with: {[facility] lives with:25569::"lives with their family"} Lives in: {Lives in:[address]70} Stairs: {opstairs:27293} Has following equipment at home: {Assistive  devices:23999}  OCCUPATION: ***  PLOF: {PLOF:24004}  PATIENT GOALS: ***  NEXT MD VISIT: ***  OBJECTIVE:  Note: Objective measures were completed at Evaluation unless otherwise noted.  DIAGNOSTIC FINDINGS: ***  PATIENT SURVEYS:  LEFS ***  COGNITION: Overall cognitive status: {cognition:24006}     SENSATION: {sensation:27233}  EDEMA:  {edema:24020}  MUSCLE LENGTH: Hamstrings: Right *** deg; Left *** deg Maisie Fus test: Right *** deg; Left *** deg  POSTURE: {posture:25561}  PALPATION: ***  LOWER EXTREMITY ROM:  {AROM/PROM:27142} ROM Right eval Left eval  Hip flexion    Hip extension    Hip abduction    Hip adduction    Hip internal rotation    Hip external rotation    Knee flexion    Knee extension    Ankle dorsiflexion    Ankle plantarflexion    Ankle inversion    Ankle eversion     (Blank rows = not tested)  LOWER EXTREMITY MMT:  MMT Right eval Left eval  Hip flexion    Hip extension    Hip abduction    Hip adduction    Hip internal rotation    Hip external rotation    Knee flexion    Knee extension    Ankle dorsiflexion    Ankle plantarflexion    Ankle inversion    Ankle eversion     (Blank rows = not tested)  LOWER EXTREMITY SPECIAL TESTS:  {LEspecialtests:26242}  FUNCTIONAL TESTS:  5 times sit to stand: *** Timed up and go (TUG): *** 2 minute walk test: ***  GAIT: Distance walked: *** Assistive device utilized: {Assistive devices:23999} Level of assistance: {Levels of  assistance:24026} Comments: ***                                                                                                                                TREATMENT DATE:  10/03/2023  Vitals: BP: HR: O2 sat: Evaluation: -ROM measured, Strength assessed, HEP prescribed, pt educated on prognosis, findings, and importance of HEP compliance if given.  Manual Therapy: -CPA of Lumbar Spinal segments L3-L5, grade II-III mobilizations -STM of Lumbar Paraspinal  musculature  Therapeutic Exercise: -Supine bridges 2 sets of 10 reps, 3 second holds, symptomatic, pt cued for max hip extension -Standing 3 way hip 1 sets 10 reps, bilaterally, pt cued for upright trunk and maintaining of neutral spine -Lateral stepping 3 laps 20 feet per lap, second 2 with RTB around ankles, pt cued for upright posture -Forward lunges, 1 set of 5 reps better performance going into RLE, pt cued for core activation and upright posture       PATIENT EDUCATION:  Education details: Pt was educated on findings of PT evaluation, prognosis, frequency of therapy visits and rationale, attendance policy, and HEP if given.   Person educated: {Person educated:25204} Education method: {Education Method:25205} Education comprehension: {Education Comprehension:25206}  HOME EXERCISE PROGRAM: ***  ASSESSMENT:  CLINICAL IMPRESSION: Patient is a 53 y.o. female who was seen today for physical therapy evaluation and treatment for frequent falls.   Patient demonstrates decreased LE strength, abnormal pain rating, and impaired balance. Patient also demonstrates difficulty with ambulation during today's session with decreased stride length and velocity noted. Patient also demonstrates ***. Patient requires ***. Patient would benefit from skilled physical therapy for increased endurance with ambulation, increased LE strength, and balance for improved gait quality, return to higher level of function with ADLs, and progress towards therapy goals.   OBJECTIVE IMPAIRMENTS: {opptimpairments:25111}.   ACTIVITY LIMITATIONS: {activitylimitations:27494}  PARTICIPATION LIMITATIONS: {participationrestrictions:25113}  PERSONAL FACTORS: {Personal factors:25162} are also affecting patient's functional outcome.   REHAB POTENTIAL: {rehabpotential:25112}  CLINICAL DECISION MAKING: {clinical decision making:25114}  EVALUATION COMPLEXITY: {Evaluation complexity:25115}   GOALS: Goals reviewed with  patient? {yes/no:20286}  SHORT TERM GOALS: Target date: ***  Patient will demonstrate evidence of independence with individualized HEP and will report compliance for at least 3 days per week for optimized progression towards remaining therapy goals. Baseline: *** Goal status: {GOALSTATUS:25110}  2.  Patient will report a decrease in pain level during community ambulation by at least 2 points for improved quality of life. Baseline: *** Goal status: {GOALSTATUS:25110}     LONG TERM GOALS: Target date: ***  Pt will demonstrate a an increase of at least 9 points on the LEFS for improved performance of community ambulation and ADL. Baseline: *** Goal status: {GOALSTATUS:25110}  2.  Pt will improve 2 MWT by *** in order to demonstrate improved functional ambulatory capacity in community setting.  Baseline: *** Goal status: {GOALSTATUS:25110}  3.  Pt will demonstrate WFL ROM (flexion and extension) in right knee, for  increased mobility and maximal efficiency of gait cycle during ambulation. Baseline: *** Goal status: {GOALSTATUS:25110}  4.  Pt will demonstrate at least 4-/5 MMT for right lower extremity for increased strength during ADL and community ambulation. Baseline: *** Goal status: {GOALSTATUS:25110}  5.  Pt will improve *** by *** in order to improve *** during functional activities.. Baseline: *** Goal status: {GOALSTATUS:25110}    PLAN:  PT FREQUENCY: {rehab frequency:25116}  PT DURATION: {rehab duration:25117}  PLANNED INTERVENTIONS: {rehab planned interventions:25118::"97110-Therapeutic exercises","97530- Therapeutic [phone]- Neuromuscular re-education","97535- Self JXBJ","47829- Manual therapy"}  PLAN FOR NEXT SESSION: ***   Luz Lex, PT, DPT [facility] Office: [phone] 7:05 AM, 10/03/23

## 2023-10-27 NOTE — Therapy (Unsigned)
 OUTPATIENT PHYSICAL THERAPY LOWER EXTREMITY EVALUATION   Patient Name: Erin Higgins MRN: [id number] DOB:[birth year], 53 y.o., female Today's Date: 10/27/2023  END OF SESSION:   Past Medical History:  Diagnosis Date   Diverticulitis    Heartburn    Herpes simplex of female genitalia    Scleredema (HCC)    Tapeworm 04/2023   Past Surgical History:  Procedure Laterality Date   COLONOSCOPY     DILATION AND CURETTAGE OF UTERUS     TUBAL LIGATION  05/03/2012   Procedure: POST PARTUM TUBAL LIGATION;  Surgeon: Lenord Radon, MD;  Location: [facility];  Service: Gynecology;  Laterality: Bilateral;  with filshie clips   Patient Active Problem List   Diagnosis Date Noted   Vision changes 06/22/2023   Herpes simplex of female genitalia    Lateral pain of left hip 03/01/2023   Unintentional weight loss of 10% body weight within 6 months 03/01/2023   Systemic sclerosis (HCC) 01/18/2023   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  Rationale for Evaluation and Treatment: Rehabilitation  ONSET DATE: ***  SUBJECTIVE:   SUBJECTIVE STATEMENT: ***  PERTINENT HISTORY: *** PAIN:  Are you having pain? {OPRCPAIN:27236}  PRECAUTIONS: {Therapy precautions:24002}  RED FLAGS: {PT Red Flags:29287}   WEIGHT BEARING RESTRICTIONS: {Yes ***/No:24003}  FALLS:  Has patient fallen in last 6 months? {fallsyesno:27318}  LIVING ENVIRONMENT: Lives with: {[facility] lives with:25569::"lives with their family"} Lives in: {Lives in:[address]70} Stairs: {opstairs:27293} Has following equipment at home: {Assistive devices:23999}  OCCUPATION: ***  PLOF:  {PLOF:24004}  PATIENT GOALS: ***  NEXT MD VISIT: ***  OBJECTIVE:  Note: Objective measures were completed at Evaluation unless otherwise noted.  DIAGNOSTIC FINDINGS:   PATIENT SURVEYS:  ABC scale ***  COGNITION: Overall cognitive status: {cognition:24006}     SENSATION: {sensation:27233}  EDEMA:  {edema:24020}  MUSCLE LENGTH: Hamstrings: Right *** deg; Left *** deg Andy Bannister test: Right *** deg; Left *** deg  POSTURE: {posture:25561}  PALPATION: ***  LOWER EXTREMITY ROM:  {AROM/PROM:27142} ROM Right eval Left eval  Hip flexion    Hip extension    Hip abduction    Hip adduction    Hip internal rotation    Hip external rotation    Knee flexion    Knee extension    Ankle dorsiflexion    Ankle plantarflexion    Ankle inversion    Ankle eversion     (Blank rows = not tested)  LOWER EXTREMITY MMT:  MMT Right eval Left eval  Hip flexion    Hip extension    Hip abduction    Hip adduction    Hip internal rotation    Hip external rotation    Knee flexion    Knee extension    Ankle dorsiflexion    Ankle plantarflexion    Ankle inversion    Ankle eversion     (Blank rows = not tested)  LOWER EXTREMITY SPECIAL TESTS:  {LEspecialtests:26242}  FUNCTIONAL TESTS:  {Functional tests:24029}  GAIT: Distance walked: *** Assistive device utilized: {Assistive devices:23999} Level of assistance: {Levels of assistance:24026} Comments: ***  TREATMENT DATE:  10/30/23: PT Eval and HEP    PATIENT EDUCATION:  Education details: PT evaluation, objective findings, POC, Importance of HEP, Precautions, Clinic policies  Person educated: Patient Education method: Explanation and Demonstration Education comprehension: verbalized understanding and returned demonstration  HOME EXERCISE PROGRAM: ***  ASSESSMENT:  CLINICAL  IMPRESSION: Patient is a 53 y.o. female who was seen today for physical therapy evaluation and treatment for "frequent falls".   OBJECTIVE IMPAIRMENTS: {opptimpairments:25111}.   ACTIVITY LIMITATIONS: {activitylimitations:27494}  PARTICIPATION LIMITATIONS: {participationrestrictions:25113}  PERSONAL FACTORS: {Personal factors:25162} are also affecting patient's functional outcome.   REHAB POTENTIAL: {rehabpotential:25112}  CLINICAL DECISION MAKING: {clinical decision making:25114}  EVALUATION COMPLEXITY: {Evaluation complexity:25115}   GOALS: Goals reviewed with patient? No  SHORT TERM GOALS: Target date: *** Patient will be independent with performance of HEP to demonstrate adequate self management of symptoms.  Baseline:  Goal status: INITIAL  2.   Patient will report at least a 25% improvement with function or pain overall since beginning PT. Baseline:  Goal status: INITIAL   LONG TERM GOALS: Target date: ***  *** Baseline:  Goal status: INITIAL  2.  *** Baseline:  Goal status: INITIAL  3.  *** Baseline:  Goal status: INITIAL  4.  *** Baseline:  Goal status: INITIAL  5.  *** Baseline:  Goal status: INITIAL  6.  *** Baseline:  Goal status: INITIAL   PLAN:  PT FREQUENCY: 1-2x/week  PT DURATION: 6 weeks  PLANNED INTERVENTIONS: 97164- PT Re-evaluation, 97110-Therapeutic exercises, 97530- Therapeutic activity, 97112- Neuromuscular re-education, 97535- Self Care, 16109- Manual therapy, [phone]- Gait training, Patient/Family education, Balance training, Stair training, Dry Needling, Joint mobilization, Spinal mobilization, Vestibular training, and Moist heat  PLAN FOR NEXT SESSION: ***   Vernard Gram E Powell-Butler, PT 10/27/2023, 4:25 PM

## 2023-10-30 ENCOUNTER — Other Ambulatory Visit: Payer: Self-pay

## 2023-10-30 ENCOUNTER — Ambulatory Visit (HOSPITAL_COMMUNITY): Attending: Internal Medicine

## 2023-10-30 DIAGNOSIS — R262 Difficulty in walking, not elsewhere classified: Secondary | ICD-10-CM | POA: Diagnosis present

## 2023-10-30 DIAGNOSIS — R2689 Other abnormalities of gait and mobility: Secondary | ICD-10-CM | POA: Insufficient documentation

## 2023-10-30 DIAGNOSIS — R296 Repeated falls: Secondary | ICD-10-CM | POA: Diagnosis present

## 2023-10-30 NOTE — Therapy (Addendum)
 OUTPATIENT PHYSICAL THERAPY LOWER EXTREMITY EVALUATION   Patient Name: Erin Higgins MRN: [id number] DOB:06/25/1971, 53 y.o., female Today's Date: 10/30/2023  END OF SESSION:  PT End of Session - 10/30/23 1259     Visit Number 1    Number of Visits 6    Date for PT Re-Evaluation 12/11/23    Authorization Type [employer]; medicaid prepaid health    PT Start Time 1300    PT Stop Time 1340    PT Time Calculation (min) 40 min    Activity Tolerance Patient tolerated treatment well    Behavior During Therapy WFL for tasks assessed/performed             Past Medical History:  Diagnosis Date   Diverticulitis    Heartburn    Herpes simplex of female genitalia    Scleredema (HCC)    Tapeworm 04/2023   Past Surgical History:  Procedure Laterality Date   COLONOSCOPY     DILATION AND CURETTAGE OF UTERUS     TUBAL LIGATION  05/03/2012   Procedure: POST PARTUM TUBAL LIGATION;  Surgeon: Lenord Radon, MD;  Location: [facility];  Service: Gynecology;  Laterality: Bilateral;  with filshie clips   Patient Active Problem List   Diagnosis Date Noted   Vision changes 06/22/2023   Herpes simplex of female genitalia    Lateral pain of left hip 03/01/2023   Unintentional weight loss of 10% body weight within 6 months 03/01/2023   Systemic sclerosis (HCC) 01/18/2023   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   Vitamin D deficiency 12/19/2022   Rash and other nonspecific skin eruption 12/19/2022   Chest pain of uncertain etiology 08/08/2022   Raynaud phenomenon 08/08/2022   Encounter for sterilization 05/03/2012    PCP: Omie Bickers, MD  REFERRING PROVIDER: Harriet Limber, FNP  REFERRING DIAG: frequent falls  THERAPY DIAG:  Repeated falls  Difficulty in walking, not elsewhere classified  Other abnormalities of gait and mobility  Rationale for Evaluation and Treatment: Rehabilitation  ONSET DATE: 04/2023  SUBJECTIVE:   SUBJECTIVE STATEMENT: Started having some falls in the  winter of 2024 into 2025; latest fall was yesterday in the house; feet sometimes tingle and are numb from the Raynaud's.Sister had to assist her up. She reports just "really stiff"; has a hard time bending down to pick things up; one of her falls was with trying to pick something up.  Feels she is stiff and weak in general  PERTINENT HISTORY: Scleroderma; taking methotrexate  for not quite a year Raynauds Here previously with OT; hands still bother her   PAIN:  Are you having pain? Yes: NPRS scale: 6/10 Pain location: all over Pain description: tight and stiff and skin is pulling Aggravating factors: scleroderma Relieving factors: rest  PRECAUTIONS: Fall  RED FLAGS: None   WEIGHT BEARING RESTRICTIONS: No  FALLS:  Has patient fallen in last 6 months? Yes. Number of falls 5  LIVING ENVIRONMENT: Lives with: lives with their family; mother and son live with her; also her sister is next door Lives in: House/apartment Stairs: Yes: External: 1 steps; none Has following equipment at home: Single point cane, Walker - 2 wheeled, and shower chair; needs BSC or raised toilet seat  OCCUPATION: Conservation officer, nature; works with kids  PLOF: Independent  PATIENT GOALS: don't want to fall but it I do fall I want to be able to get up on my own  NEXT MD VISIT: mid May  OBJECTIVE:  Note: Objective measures were completed at Evaluation unless  otherwise noted.  DIAGNOSTIC FINDINGS:   PATIENT SURVEYS:  LEFS 8/80 10%  COGNITION: Overall cognitive status: Within functional limits for tasks assessed     SENSATION: Hot/Cold: Raynauds  EDEMA:  Fingers are swollen  MUSCLE LENGTH: Hamstrings: Right  deg; Left  deg tight bilaterally   POSTURE: rounded shoulders and forward head  PALPATION: General tenderness  Noted tightness lumbar flexion and shoulder flexion  LOWER EXTREMITY ROM:  Active ROM Right eval Left eval  Hip flexion    Hip extension    Hip abduction    Hip  adduction    Hip internal rotation    Hip external rotation    Knee flexion    Knee extension    Ankle dorsiflexion    Ankle plantarflexion    Ankle inversion    Ankle eversion     (Blank rows = not tested)  LOWER EXTREMITY MMT:  MMT Right eval Left eval  Hip flexion 4+ 4  Hip extension    Hip abduction    Hip adduction    Hip internal rotation    Hip external rotation    Knee flexion    Knee extension 5 5  Ankle dorsiflexion 5 5  Ankle plantarflexion    Ankle inversion    Ankle eversion     (Blank rows = not tested)   FUNCTIONAL TESTS:  5 times sit to stand: 27.34 sec using hands to push up Timed up and go (TUG): next visit SLS right 30" and left 6"  GAIT: Distance walked: 50 ft in clinic Assistive device utilized: None Level of assistance: Modified independence Comments: slow gait speed                                                                                                                                TREATMENT DATE:  10/30/23: PT Eval and HEP instruction   PATIENT EDUCATION:  Education details: PT evaluation, objective findings, POC, Importance of HEP, Precautions, Clinic policies  Person educated: Patient Education method: Explanation and Demonstration Education comprehension: verbalized understanding and returned demonstration  HOME EXERCISE PROGRAM: Access Code: [email or web address] URL: [email or web address] Date: 10/30/2023 Prepared by: [facility] - Rehab  Exercises - Sit to Stand  - 2 x daily - 7 x weekly - 1 sets - 10 reps - standing single leg balance at the counter (try to not hold on)  - 2 x daily - 7 x weekly - 1 sets - 5 reps - 30 sec hold - Seated 3 Way Exercise Ball Roll Out Stretch  - 2 x daily - 7 x weekly - 1 sets - 10 reps - Seated Hamstring Stretch  - 2 x daily - 7 x weekly - 1 sets - 5 reps - 20 sec hold  ASSESSMENT:  CLINICAL IMPRESSION: Patient is a 53 y.o. female who was seen today for physical therapy evaluation and  treatment for "frequent falls".  Patient demonstrates decreased strength, balance deficits, ROM limitations  and gait abnormalities which are negatively impacting patient ability to perform ADLs and functional mobility tasks. Patient will benefit from skilled physical therapy services to address these deficits to improve level of function with ADLs, functional mobility tasks, and reduce risk for falls.    OBJECTIVE IMPAIRMENTS: Abnormal gait, decreased balance, decreased mobility, difficulty walking, decreased ROM, decreased strength, hypomobility, increased fascial restrictions, impaired perceived functional ability, impaired sensation, and pain.   ACTIVITY LIMITATIONS: carrying, lifting, bending, sitting, standing, squatting, sleeping, stairs, transfers, bed mobility, bathing, toileting, dressing, reach over head, hygiene/grooming, locomotion level, and caring for others  PARTICIPATION LIMITATIONS: meal prep, cleaning, laundry, driving, shopping, community activity, occupation, and yard work  PERSONAL FACTORS: 1-2 comorbidities: scleroderma, Raynauds  are also affecting patient's functional outcome.   REHAB POTENTIAL: Good  CLINICAL DECISION MAKING: Evolving/moderate complexity  EVALUATION COMPLEXITY: Moderate   GOALS: Goals reviewed with patient? No  SHORT TERM GOALS: Target date: 11/20/2023 Patient will be independent with performance of HEP to demonstrate adequate self management of symptoms.  Baseline:  Goal status: INITIAL  2.   Patient will report at least a 25% improvement with function or pain overall since beginning PT. Baseline:  Goal status: INITIAL   LONG TERM GOALS: Target date: 12/11/2023  Patient will be independent in self management strategies to improve quality of life and functional outcomes.   Baseline:  Goal status: INITIAL  2.  Patient will report 50% improvement overall  Baseline:  Goal status: INITIAL  3.  Patient will be able to stand SLS x 40"  each leg to demonstrate improved functional balance.   Baseline: right 30" and left 6" Goal status: INITIAL  4.  Patient will improve LEFS score by 20 points to demonstrate improved perceived function  Baseline: 8/80 Goal status: INITIAL  5.  Patient will improve 5 times sit to stand score to 20 sec or less to demonstrate improved functional mobility and increased leg strength.     Baseline: 27.34 sec Goal status: INITIAL    PLAN:  PT FREQUENCY: 1-2x/week  PT DURATION: 6 weeks  PLANNED INTERVENTIONS: 97164- PT Re-evaluation, 97110-Therapeutic exercises, 97530- Therapeutic activity, 97112- Neuromuscular re-education, 97535- Self Care, 59563- Manual therapy, [phone]- Gait training, Patient/Family education, Balance training, Stair training, Dry Needling, Joint mobilization, Spinal mobilization, Vestibular training, and Moist heat  PLAN FOR NEXT SESSION: Review of HEP and goals; general spinal and lower extremity mobility and strengthening, balance, functional strength; test TUG; check hip extension and abduction strength   1:44 PM, 10/30/23 Treyvone Chelf Small Sabirin Baray MPT [facility] [place] [phone] [phone]   Managed Medicaid Authorization Request  Visit Dx Codes: R29.6  Functional Tool Score: LEFS 8/80 10%  For all possible CPT codes, reference the Planned Interventions line above.     Check all conditions that are expected to impact treatment: {Conditions expected to impact treatment:None of these apply   If treatment provided at initial evaluation, no treatment charged due to lack of authorization.

## 2023-11-09 ENCOUNTER — Encounter (HOSPITAL_COMMUNITY)

## 2023-11-22 ENCOUNTER — Ambulatory Visit (HOSPITAL_COMMUNITY): Admitting: Physical Therapy

## 2023-11-22 DIAGNOSIS — R296 Repeated falls: Secondary | ICD-10-CM | POA: Diagnosis not present

## 2023-11-22 DIAGNOSIS — R262 Difficulty in walking, not elsewhere classified: Secondary | ICD-10-CM

## 2023-11-22 DIAGNOSIS — R2689 Other abnormalities of gait and mobility: Secondary | ICD-10-CM

## 2023-11-22 NOTE — Therapy (Signed)
 OUTPATIENT PHYSICAL THERAPY LOWER EXTREMITY TREATMENT   Patient Name: Erin Higgins MRN: [id number] DOB:Oct 18, 1970, 53 y.o., female Today's Date: 11/22/2023  END OF SESSION:  PT End of Session - 11/22/23 1511     Visit Number 2    Number of Visits 7    Date for PT Re-Evaluation 12/11/23    Authorization Type [employer]; medicaid prepaid health    Authorization Time Period 6 visits approved 10/30/23-12/20/23    Authorization - Visit Number 1    Authorization - Number of Visits 6    Progress Note Due on Visit 6    PT Start Time 1515    PT Stop Time 1555    PT Time Calculation (min) 40 min    Activity Tolerance Patient tolerated treatment well    Behavior During Therapy WFL for tasks assessed/performed             Past Medical History:  Diagnosis Date   Diverticulitis    Heartburn    Herpes simplex of female genitalia    Scleredema (HCC)    Tapeworm 04/2023   Past Surgical History:  Procedure Laterality Date   COLONOSCOPY     DILATION AND CURETTAGE OF UTERUS     TUBAL LIGATION  05/03/2012   Procedure: POST PARTUM TUBAL LIGATION;  Surgeon: Lenord Radon, MD;  Location: [facility];  Service: Gynecology;  Laterality: Bilateral;  with filshie clips   Patient Active Problem List   Diagnosis Date Noted   Vision changes 06/22/2023   Herpes simplex of female genitalia    Lateral pain of left hip 03/01/2023   Unintentional weight loss of 10% body weight within 6 months 03/01/2023   Systemic sclerosis (HCC) 01/18/2023   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  Currently pain is 6/10 across her chest/UE/thighs.  Started having some falls in the winter of 2024 into 2025; latest fall was yesterday in the house; feet sometimes tingle and are numb from the Raynaud's.Sister had to assist her up. She reports just "really stiff"; has a hard time bending down to pick things up; one of her falls was with trying to pick something up.  Feels she is stiff and weak in general  PERTINENT HISTORY: Scleroderma; taking methotrexate  for not quite a year Raynauds Here previously with OT; hands still bother her   PAIN:  Are you having pain? Yes: NPRS scale: 6/10 Pain location: all over Pain description: tight and stiff and skin is pulling Aggravating factors: scleroderma Relieving factors: rest  PRECAUTIONS: Fall  RED FLAGS: None   WEIGHT BEARING RESTRICTIONS: No  FALLS:  Has patient fallen in last 6 months? Yes. 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  DIAGNOSTIC FINDINGS:   PATIENT SURVEYS:  LEFS 8/80 10%  COGNITION: Overall cognitive status: Within functional limits for tasks assessed     SENSATION: Hot/Cold: Raynauds  EDEMA:  Fingers are swollen  MUSCLE LENGTH: Hamstrings: Right  deg; Left  deg  tight bilaterally   POSTURE: rounded shoulders and forward head  PALPATION: General tenderness  Noted tightness lumbar flexion and shoulder flexion  LOWER EXTREMITY ROM:  Active ROM Right eval Left eval  Hip flexion    Hip extension    Hip abduction    Hip adduction    Hip internal rotation    Hip external rotation    Knee flexion    Knee extension    Ankle dorsiflexion    Ankle plantarflexion    Ankle inversion    Ankle eversion     (Blank rows = not tested)  LOWER EXTREMITY MMT:  MMT Right eval Left eval  Hip flexion 4+ 4  Hip extension 3 3  Hip abduction 3- 3-  Hip adduction    Hip internal rotation    Hip external rotation    Knee flexion    Knee extension 5 5  Ankle dorsiflexion 5 5  Ankle plantarflexion    Ankle inversion    Ankle eversion     (Blank rows = not tested)   FUNCTIONAL TESTS:  5 times sit to stand: 27.34 sec using hands to push up Timed up and go (TUG):                     SLS right 30" and left 6"  GAIT: Distance walked: 50 ft in clinic Assistive device utilized: None Level of assistance: Modified independence Comments: slow gait speed                                                                                                                                TREATMENT DATE:  11/22/23 Sit to stands 2X5 no UE from standard chair  SLS 30" both LE without UE assist  SLS with airex pad 15" max in 30" bout bilaterally MMT for hip abduction and extension (see above chart) Prone:  heelsqueezes 10X5" Standing:  hip abduction 10X2 each  Hip extension 10X2 each  Heel raises 20X  10/30/23: PT Eval and HEP instruction   PATIENT EDUCATION:  Education details: PT evaluation, objective findings, POC, Importance of HEP, Precautions, Clinic policies  Person educated: Patient Education method: Explanation and Demonstration Education comprehension: verbalized understanding and returned demonstration  HOME EXERCISE PROGRAM: Access Code:  [email or web address] URL: [email or web address] Date: 10/30/2023 Prepared by: [facility] - Rehab  Exercises - Sit  to Stand  - 2 x daily - 7 x weekly - 1 sets - 10 reps - standing single leg balance at the counter (try to not hold on)  - 2 x daily - 7 x weekly - 1 sets - 5 reps - 30 sec hold - Seated 3 Way Exercise Ball Roll Out Stretch  - 2 x daily - 7 x weekly - 1 sets - 10 reps - Seated Hamstring Stretch  - 2 x daily - 7 x weekly - 1 sets - 5 reps - 20 sec hold  Date: 11/22/2023 - Standing Hip Abduction with Anterior Support  - 2 x daily - 7 x weekly - 2 sets - 10 reps - Standing Hip Extension with Chair  - 2 x daily - 7 x weekly - 3 sets - 10 reps - Standing Heel Raise with Support  - 2 x daily - 7 x weekly - 3 sets - 10 reps  ASSESSMENT:  CLINICAL IMPRESSION: Reviewed goals and POC moving forward. Pt able to recall and complete established therex correctly.  Reviewed UE exercises she is completing at home.  Tested hip abductor and extensor mm with noted weakness.  Added standing strengthening to target these mm as well as heel raises.  These were added to HEP.  PT with improved single leg stance, completing 30" each LE without difficulty.  Added foam surface to increase challenge with 15" max before having to use UE to correct LOB.  Pt did require intermittent rest breaks.   Patient is a 53 y.o. female who was seen today for physical therapy evaluation and treatment for "frequent falls".  Patient demonstrates decreased strength, balance deficits, ROM limitations and gait abnormalities which are negatively impacting patient ability to perform ADLs and functional mobility tasks. Patient will benefit from skilled physical therapy services to address these deficits to improve level of function with ADLs, functional mobility tasks, and reduce risk for falls.    OBJECTIVE IMPAIRMENTS: Abnormal gait, decreased balance, decreased mobility, difficulty walking, decreased ROM, decreased strength,  hypomobility, increased fascial restrictions, impaired perceived functional ability, impaired sensation, and pain.   ACTIVITY LIMITATIONS: carrying, lifting, bending, sitting, standing, squatting, sleeping, stairs, transfers, bed mobility, bathing, toileting, dressing, reach over head, hygiene/grooming, locomotion level, and caring for others  PARTICIPATION LIMITATIONS: meal prep, cleaning, laundry, driving, shopping, community activity, occupation, and yard work  PERSONAL FACTORS: 1-2 comorbidities: scleroderma, Raynauds are also affecting patient's functional outcome.   REHAB POTENTIAL: Good  CLINICAL DECISION MAKING: Evolving/moderate complexity  EVALUATION COMPLEXITY: Moderate   GOALS: Goals reviewed with patient? No  SHORT TERM GOALS: Target date: 11/20/2023 Patient will be independent with performance of HEP to demonstrate adequate self management of symptoms.  Baseline:  Goal status: INITIAL  2.   Patient will report at least a 25% improvement with function or pain overall since beginning PT. Baseline:  Goal status: INITIAL   LONG TERM GOALS: Target date: 12/11/2023  Patient will be independent in self management strategies to improve quality of life and functional outcomes.   Baseline:  Goal status: INITIAL  2.  Patient will report 50% improvement overall  Baseline:  Goal status: INITIAL  3.  Patient will be able to stand SLS x 40" each leg to demonstrate improved functional balance.   Baseline: right 30" and left 6" Goal status: INITIAL  4.  Patient will improve LEFS score by 20 points to demonstrate improved perceived function  Baseline: 8/80 Goal status: INITIAL  5.  Patient will improve 5  times sit to stand score to 20 sec or less to demonstrate improved functional mobility and increased leg strength.     Baseline: 27.34 sec Goal status: INITIAL    PLAN:  PT FREQUENCY: 1-2x/week  PT DURATION: 6 weeks  PLANNED INTERVENTIONS: 97164- PT  Re-evaluation, 97110-Therapeutic exercises, 97530- Therapeutic activity, 97112- Neuromuscular re-education, 97535- Self Care, 16109- Manual therapy, [phone]- Gait training, Patient/Family education, Balance training, Stair training, Dry Needling, Joint mobilization, Spinal mobilization, Vestibular training, and Moist heat  PLAN FOR NEXT SESSION: Progress general spinal and lower extremity mobility and strengthening, balance, functional strength.  Next session test TUG.  Lorenso Romance, PTA/CLT [facility] Ph: [phone]  3:14 PM, 11/22/23

## 2023-11-29 ENCOUNTER — Encounter (HOSPITAL_COMMUNITY)

## 2023-12-06 ENCOUNTER — Encounter (HOSPITAL_COMMUNITY): Payer: Self-pay

## 2023-12-06 ENCOUNTER — Ambulatory Visit (HOSPITAL_COMMUNITY): Attending: Internal Medicine

## 2023-12-06 DIAGNOSIS — I7300 Raynaud's syndrome without gangrene: Secondary | ICD-10-CM | POA: Diagnosis present

## 2023-12-06 DIAGNOSIS — M25552 Pain in left hip: Secondary | ICD-10-CM | POA: Insufficient documentation

## 2023-12-06 DIAGNOSIS — K1379 Other lesions of oral mucosa: Secondary | ICD-10-CM | POA: Diagnosis present

## 2023-12-06 DIAGNOSIS — Z79899 Other long term (current) drug therapy: Secondary | ICD-10-CM | POA: Diagnosis present

## 2023-12-06 DIAGNOSIS — L943 Sclerodactyly: Secondary | ICD-10-CM | POA: Insufficient documentation

## 2023-12-06 DIAGNOSIS — H539 Unspecified visual disturbance: Secondary | ICD-10-CM | POA: Diagnosis present

## 2023-12-06 DIAGNOSIS — M79642 Pain in left hand: Secondary | ICD-10-CM | POA: Insufficient documentation

## 2023-12-06 DIAGNOSIS — R634 Abnormal weight loss: Secondary | ICD-10-CM | POA: Diagnosis present

## 2023-12-06 DIAGNOSIS — R262 Difficulty in walking, not elsewhere classified: Secondary | ICD-10-CM | POA: Diagnosis present

## 2023-12-06 DIAGNOSIS — R29898 Other symptoms and signs involving the musculoskeletal system: Secondary | ICD-10-CM | POA: Insufficient documentation

## 2023-12-06 DIAGNOSIS — M79641 Pain in right hand: Secondary | ICD-10-CM | POA: Insufficient documentation

## 2023-12-06 DIAGNOSIS — R296 Repeated falls: Secondary | ICD-10-CM | POA: Insufficient documentation

## 2023-12-06 DIAGNOSIS — R2689 Other abnormalities of gait and mobility: Secondary | ICD-10-CM | POA: Diagnosis present

## 2023-12-06 DIAGNOSIS — M349 Systemic sclerosis, unspecified: Secondary | ICD-10-CM | POA: Insufficient documentation

## 2023-12-06 NOTE — Therapy (Signed)
 OUTPATIENT PHYSICAL THERAPY LOWER EXTREMITY TREATMENT   Patient Name: Erin Higgins MRN: [id number] DOB:[birth year], 53 y.o., female Today's Date: 12/06/2023  END OF SESSION:  PT End of Session - 12/06/23 1440     Visit Number 3    Number of Visits 7    Date for PT Re-Evaluation 12/11/23    Authorization Type [employer]; medicaid prepaid health    Authorization Time Period 6 visits approved 10/30/23-12/20/23    Authorization - Visit Number 3    Authorization - Number of Visits 6    Progress Note Due on Visit 6    PT Start Time 1438    PT Stop Time 1515    PT Time Calculation (min) 37 min    Activity Tolerance Patient tolerated treatment well    Behavior During Therapy WFL for tasks assessed/performed             Past Medical History:  Diagnosis Date   Diverticulitis    Heartburn    Herpes simplex of female genitalia    Scleredema (HCC)    Tapeworm 04/2023   Past Surgical History:  Procedure Laterality Date   COLONOSCOPY     DILATION AND CURETTAGE OF UTERUS     TUBAL LIGATION  05/03/2012   Procedure: POST PARTUM TUBAL LIGATION;  Surgeon: Lenord Radon, MD;  Location: [facility];  Service: Gynecology;  Laterality: Bilateral;  with filshie clips   Patient Active Problem List   Diagnosis Date Noted   Vision changes 06/22/2023   Herpes simplex of female genitalia    Lateral pain of left hip 03/01/2023   Unintentional weight loss of 10% body weight within 6 months 03/01/2023   Systemic sclerosis (HCC) 01/18/2023   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 Just generally uncomfortable, 4/10. Report she did have a fall since last visit on Wednesday but she fell onto her bed. Reports foot was cramping/spasms caused fall.   Started having some falls in the winter of 2024 into 2025; latest fall was yesterday in the house; feet sometimes tingle and are numb from the Raynaud's.Sister had to assist her up. She reports just really stiff; has a hard time bending down to pick things up; one of her falls was with trying to pick something up.  Feels she is stiff and weak in general  PERTINENT HISTORY: Scleroderma; taking methotrexate  for not quite a year Raynauds Here previously with OT; hands still bother her   PAIN:  Are you having pain? Yes: NPRS scale: 6/10 Pain location: all over Pain description: tight and stiff and skin is pulling Aggravating factors: scleroderma Relieving factors: rest  PRECAUTIONS: Fall  RED FLAGS: None   WEIGHT BEARING RESTRICTIONS: No  FALLS:  Has patient fallen in last 6 months? Yes. 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  DIAGNOSTIC FINDINGS:   PATIENT SURVEYS:  LEFS 8/80 10%  COGNITION: Overall cognitive status: Within functional limits for tasks  assessed     SENSATION: Hot/Cold: Raynauds  EDEMA:  Fingers are swollen  MUSCLE LENGTH: Hamstrings: Right  deg; Left  deg tight bilaterally   POSTURE: rounded shoulders and forward head  PALPATION: General tenderness  Noted tightness lumbar flexion and shoulder flexion  LOWER EXTREMITY ROM:  Active ROM Right eval Left eval  Hip flexion    Hip extension    Hip abduction    Hip adduction    Hip internal rotation    Hip external rotation    Knee flexion    Knee extension    Ankle dorsiflexion    Ankle plantarflexion    Ankle inversion    Ankle eversion     (Blank rows = not tested)  LOWER EXTREMITY MMT:  MMT Right eval Left eval  Hip flexion 4+ 4  Hip extension 3 3  Hip abduction 3- 3-  Hip adduction    Hip internal rotation    Hip external rotation    Knee flexion    Knee extension 5 5  Ankle dorsiflexion 5 5  Ankle plantarflexion    Ankle inversion    Ankle eversion     (Blank rows = not tested)   FUNCTIONAL TESTS:  5 times sit to stand: 27.34 sec using hands to push up Timed up and go (TUG):    17  on  12/06/23              SLS right 30 and left 6  GAIT: Distance walked: 50 ft in clinic Assistive device utilized: None Level of assistance: Modified independence Comments: slow gait speed                                                                                                                                TREATMENT DATE:  12/06/23: NuStep, seat 7, 5' Timed Up and Go STS: 2x10, v cues (see assessment) Heel raises 2x10, in // bars Toe Raises, 2x10, in // bars Hip abduction 10X2 each, in // bars Hip Extension 2x10, in // bars Shoulder Ext + march: 10x2, YTB, CGA Hamstring stretch, seated, 2x30   11/22/23 Sit to stands 2X5 no UE from standard chair  SLS 30 both LE without UE assist  SLS with airex pad 15 max in 30 bout bilaterally MMT for hip abduction and extension (see above chart) Prone:  heelsqueezes 10X5 Standing:  hip abduction 10X2 each  Hip extension 10X2 each  Heel raises 20X  10/30/23: PT Eval and HEP instruction   PATIENT EDUCATION:  Education details: PT evaluation, objective findings, POC, Importance of HEP, Precautions, Clinic policies  Person educated: Patient Education method: Explanation and Demonstration Education comprehension: verbalized understanding and returned demonstration  HOME EXERCISE PROGRAM: Access Code: [email or web address] URL: [email or web address] Date: 10/30/2023 Prepared by: AP - Rehab  Exercises - Sit to Stand  - 2 x daily - 7 x weekly - 1 sets - 10 reps - standing single leg balance at the counter (try to not hold on)  - 2 x daily - 7 x weekly - 1 sets - 5 reps - 30 sec hold - Seated 3 Way Exercise Ball Roll Out Stretch  - 2 x daily - 7 x weekly - 1 sets - 10 reps - Seated Hamstring Stretch  - 2 x daily - 7 x weekly - 1 sets - 5 reps - 20 sec hold  Date: 11/22/2023 - Standing Hip Abduction with Anterior Support  - 2 x daily - 7 x weekly - 2 sets - 10 reps - Standing Hip Extension with Chair  - 2 x daily - 7 x weekly - 3 sets - 10 reps - Standing Heel Raise with Support  - 2 x daily - 7 x weekly - 3 sets - 10 reps  ASSESSMENT:  CLINICAL IMPRESSION: Patient tolerated session well. Began with general warm up on Nustep, pt reporting inc stiffness this date. Followed with TUG, pt score indicating inc fall risk. Remainder of session spent focusing on LE strength and balance at end of session. Verbal cueing during STS for core, quad, and glute engagement demo improvements with completion of activity. During toe raises, patient reports increased discomfort that feels like shin splits. CGA during shoulder extension and march activity for safety. Patient will benefit from continued skilled physical therapy in order to address her remaining deficits to improve function/QOL.     Patient is a 53 y.o. female who was seen today for physical therapy evaluation and treatment  for frequent falls.  Patient demonstrates decreased strength, balance deficits, ROM limitations and gait abnormalities which are negatively impacting patient ability to perform ADLs and functional mobility tasks. Patient will benefit from skilled physical therapy services to address these deficits to improve level of function with ADLs, functional mobility tasks, and reduce risk for falls.    OBJECTIVE IMPAIRMENTS: Abnormal gait, decreased balance, decreased mobility, difficulty walking, decreased ROM, decreased strength, hypomobility, increased fascial restrictions, impaired perceived functional ability, impaired sensation, and pain.   ACTIVITY LIMITATIONS: carrying, lifting, bending, sitting, standing, squatting, sleeping, stairs, transfers, bed mobility, bathing, toileting, dressing, reach over head, hygiene/grooming, locomotion level, and caring for others  PARTICIPATION LIMITATIONS: meal prep, cleaning, laundry, driving, shopping, community activity, occupation, and yard work  PERSONAL FACTORS: 1-2 comorbidities: scleroderma, Raynauds are also affecting patient's functional outcome.   REHAB POTENTIAL: Good  CLINICAL DECISION MAKING: Evolving/moderate complexity  EVALUATION COMPLEXITY: Moderate   GOALS: Goals reviewed with patient? No  SHORT TERM GOALS: Target date: 11/20/2023 Patient will be independent with performance of HEP to demonstrate adequate self management of symptoms.  Baseline:  Goal status: INITIAL  2.   Patient will report at least a 25% improvement with function or pain overall since beginning PT. Baseline:  Goal status: INITIAL   LONG TERM GOALS: Target date: 12/11/2023  Patient will be independent in self management strategies to improve quality of life and functional outcomes.  Baseline:  Goal status: INITIAL  2.  Patient will report 50% improvement overall  Baseline:  Goal status: INITIAL  3.  Patient will be able to stand SLS x 40 each leg to  demonstrate improved functional balance.   Baseline: right 30 and left 6 Goal status: INITIAL  4.  Patient will improve LEFS score by 20 points to demonstrate improved perceived function  Baseline: 8/80 Goal status: INITIAL  5.  Patient will improve 5 times sit to stand score to 20 sec or less to demonstrate improved functional mobility and increased leg strength.     Baseline: 27.34 sec Goal status: INITIAL    PLAN:  PT FREQUENCY: 1-2x/week  PT DURATION: 6 weeks  PLANNED INTERVENTIONS: 97164- PT Re-evaluation, 97110-Therapeutic exercises, 97530- Therapeutic activity, 97112- Neuromuscular re-education, 97535- Self Care, 16109- Manual therapy, [phone]- Gait training, Patient/Family education, Balance training, Stair training, Dry Needling, Joint mobilization, Spinal mobilization, Vestibular training, and Moist heat  PLAN FOR NEXT SESSION: Progress general spinal and lower extremity mobility and strengthening, balance, functional strength, progress note   3:17 PM, 12/06/23 Trumaine Wimer Powell-Butler, PT, DPT [facility] [place]
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
## 2023-12-13 ENCOUNTER — Ambulatory Visit (HOSPITAL_COMMUNITY): Admitting: Physical Therapy

## 2023-12-13 DIAGNOSIS — R29898 Other symptoms and signs involving the musculoskeletal system: Secondary | ICD-10-CM

## 2023-12-13 DIAGNOSIS — R262 Difficulty in walking, not elsewhere classified: Secondary | ICD-10-CM

## 2023-12-13 DIAGNOSIS — R2689 Other abnormalities of gait and mobility: Secondary | ICD-10-CM

## 2023-12-13 DIAGNOSIS — R296 Repeated falls: Secondary | ICD-10-CM

## 2023-12-13 NOTE — Therapy (Signed)
 OUTPATIENT PHYSICAL THERAPY LOWER EXTREMITY TREATMENT Progress Note/Recert Reporting Period 10/31/23 to 12/13/23  See note below for Objective Data and Assessment of Progress/Goals.      Patient Name: Erin Higgins MRN: [id number] DOB:09/04/1970, 53 y.o., female Today's Date: 12/13/2023  END OF SESSION:  PT End of Session - 12/13/23 0739     Visit Number 4    Number of Visits 7    Date for PT Re-Evaluation 12/11/23    Authorization Type [employer]; medicaid prepaid health    Authorization Time Period 6 visits approved 10/30/23-12/20/23    Authorization - Visit Number 4    Authorization - Number of Visits 6    Progress Note Due on Visit 6    PT Start Time 0718    PT Stop Time 0758    PT Time Calculation (min) 40 min    Activity Tolerance Patient tolerated treatment well    Behavior During Therapy [facility] for tasks assessed/performed           Past Medical History:  Diagnosis Date   Diverticulitis    Heartburn    Herpes simplex of female genitalia    Scleredema (HCC)    Tapeworm 04/2023   Past Surgical History:  Procedure Laterality Date   COLONOSCOPY     DILATION AND CURETTAGE OF UTERUS     TUBAL LIGATION  05/03/2012   Procedure: POST PARTUM TUBAL LIGATION;  Surgeon: Lenord Radon, MD;  Location: [facility];  Service: Gynecology;  Laterality: Bilateral;  with filshie clips   Patient Active Problem List   Diagnosis Date Noted   Vision changes 06/22/2023   Herpes simplex of female genitalia    Lateral pain of left hip 03/01/2023   Unintentional weight loss of 10% body weight within 6 months 03/01/2023   Systemic sclerosis (HCC) 01/18/2023   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   Vitamin D deficiency 12/19/2022   Rash and other nonspecific skin eruption 12/19/2022   Chest pain of uncertain etiology 08/08/2022   Raynaud phenomenon 08/08/2022   Encounter for sterilization 05/03/2012    PCP: Omie Bickers, MD  REFERRING PROVIDER: Harriet Limber, FNP  REFERRING DIAG:  frequent falls  THERAPY DIAG:  Repeated falls  Difficulty in walking, not elsewhere classified  Other abnormalities of gait and mobility  Other symptoms and signs involving the musculoskeletal system  Rationale for Evaluation and Treatment: Rehabilitation  ONSET DATE: 04/2023  SUBJECTIVE:   SUBJECTIVE STATEMENT: Pt reports she feels a little improvement but overall same amount of pain around 4/10.  Went to MD yesterday and nothing new.  Pt states she feels 15% better.  Mostly improved in her LE strength but still feels extremely tight and has UE weakness.   Started having some falls in the winter of 2024 into 2025; latest fall was yesterday in the house; feet sometimes tingle and are numb from the Raynaud's.Sister had to assist her up. She reports just really stiff; has a hard time bending down to pick things up; one of her falls was with trying to pick something up.  Feels she is stiff and weak in general  PERTINENT HISTORY: Scleroderma; taking methotrexate  for not quite a year Raynauds Here previously with OT; hands still bother her   PAIN:  Are you having pain? Yes: NPRS scale: 6/10 Pain location: all over Pain description: tight and stiff and skin is pulling Aggravating factors: scleroderma Relieving factors: rest  PRECAUTIONS: Fall  RED FLAGS: None   WEIGHT BEARING RESTRICTIONS: No  FALLS:  Has patient  fallen in last 6 months? Yes. 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  DIAGNOSTIC FINDINGS:   PATIENT SURVEYS:  LEFS evaluation:  12/13/23:  20% (at evaluation:  8/80 10%)  COGNITION: Overall cognitive status: Within functional limits for tasks assessed     SENSATION: Hot/Cold: Raynauds  EDEMA:  Fingers are swollen  MUSCLE LENGTH: Hamstrings: Right  deg; Left  deg tight bilaterally   POSTURE: rounded shoulders and forward head  PALPATION: General tenderness  Noted tightness lumbar flexion and shoulder flexion  LOWER EXTREMITY ROM:  Active ROM Right eval Left eval  Hip flexion    Hip extension    Hip abduction    Hip adduction    Hip internal rotation    Hip external rotation    Knee flexion    Knee extension    Ankle dorsiflexion    Ankle plantarflexion    Ankle inversion    Ankle eversion     (Blank rows = not tested)  LOWER EXTREMITY MMT:  MMT Right eval Left eval  Hip flexion 4+ 4  Hip extension 3 3  Hip abduction 3- 3-  Hip adduction    Hip internal rotation    Hip external rotation    Knee flexion    Knee extension 5 5  Ankle dorsiflexion 5 5  Ankle plantarflexion    Ankle inversion    Ankle eversion     (Blank rows = not tested)   FUNCTIONAL TESTS:  Evaluation: 5 times sit to stand: 27.34 sec using hands to push up Timed up and go (TUG):    17  on  12/06/23              SLS right 30 and left 6  12/13/23: Functional testing:  5X STS: 19.38  LEFS:  18/80=20%  SLS:  both >40 without UE assist   GAIT: Distance walked: 50 ft in clinic Assistive device utilized: None Level of assistance: Modified independence Comments: slow gait speed                                                                                                                                TREATMENT DATE:  12/13/23: NuStep, seat 7, UE/LE  [place] level 3, 5' Standing: in // bars Heel raises on incline 20X Toe Raises on decline,  20X  Hip abduction 10X2 each Hip Extension 2x10 each Hamstring stretch on 12 step 2X30 each  Theraband, yellow Shoulder Ext + march: 10x2 Theraband, yellow rows 2X10 Theraband, yellow pallof press 2X10 each Vectors 10X3 each LE with 1 HHA Functional testing:  5X STS: 19.38  LEFS:  18/80=20%  SLS:  both >40 without UE assist  12/06/23: NuStep, seat 7, 5' Timed Up and Go STS: 2x10, v cues (see assessment) Heel raises 2x10, in // bars Toe Raises, 2x10, in // bars Hip abduction 10X2 each, in // bars Hip Extension 2x10, in // bars Shoulder Ext + march: 10x2, YTB, CGA Hamstring stretch, seated, 2x30   11/22/23 Sit to stands 2X5 no UE from standard chair  SLS 30 both LE without UE assist  SLS with airex pad 15 max in 30 bout bilaterally MMT for hip abduction and extension (see above chart) Prone:  heelsqueezes 10X5 Standing:  hip abduction 10X2 each  Hip extension 10X2 each  Heel raises 20X  10/30/23: PT Eval and HEP instruction   PATIENT EDUCATION:  Education details: PT evaluation, objective findings, POC, Importance of HEP, Precautions, Clinic policies  Person educated: Patient Education method: Explanation and Demonstration Education comprehension: verbalized understanding and returned demonstration  HOME EXERCISE PROGRAM: Access Code: [email or web address] URL: [email or web address] Date: 10/30/2023 Prepared by: [facility] - Rehab  Exercises - Sit to Stand  - 2 x daily - 7 x weekly - 1 sets - 10 reps - standing single leg balance at the counter (try to not hold on)  - 2 x daily - 7 x weekly - 1 sets - 5 reps - 30 sec hold - Seated 3 Way Exercise Ball Roll Out Stretch  - 2 x daily - 7 x weekly - 1 sets - 10 reps - Seated Hamstring Stretch  - 2 x daily - 7 x weekly - 1 sets - 5 reps - 20 sec hold  Date: 11/22/2023 - Standing Hip Abduction with Anterior Support  - 2 x daily - 7 x weekly - 2 sets - 10 reps - Standing Hip Extension with Chair  - 2 x daily - 7 x weekly -  3 sets - 10 reps - Standing Heel Raise with Support  - 2 x daily - 7 x weekly - 3 sets - 10 reps  ASSESSMENT:  CLINICAL IMPRESSION: Progress note completed this session as certification has ended. Patient continues to present with moderate amount of stiffness and continued pain reporting overall improvement of 15%.  Functional measures tested resulting in improvements including sit to stand, single leg stance and Lower extremity functional scale questionnaire.  Pt has met 3 long term goals and progressing well towards remaining.  Pt will continue to benefit in additional therapy to improve all deficits.  For today's session, additional theraband challenges added to POC.  These were completed in reduced ROM despite VC's and difficulty maintaining straight line ROM with pallof.  Progressed balance exercises to include vectors with ability to complete using only 1 UE.  Pt continues to require verbal cues for core/glute engagement during activities. Patient will benefit from continued skilled physical therapy in order to address her remaining deficits to improve function/QOL.    Patient is a 53 y.o. female who was seen today for physical therapy evaluation and treatment for frequent falls.  Patient demonstrates decreased strength, balance deficits, ROM limitations and gait abnormalities which are negatively impacting patient ability to perform ADLs and functional mobility tasks. Patient will benefit from skilled physical therapy services to address  these deficits to improve level of function with ADLs, functional mobility tasks, and reduce risk for falls.    OBJECTIVE IMPAIRMENTS: Abnormal gait, decreased balance, decreased mobility, difficulty walking, decreased ROM, decreased strength, hypomobility, increased fascial restrictions, impaired perceived functional ability, impaired sensation, and pain.   ACTIVITY LIMITATIONS: carrying, lifting, bending, sitting, standing, squatting, sleeping, stairs,  transfers, bed mobility, bathing, toileting, dressing, reach over head, hygiene/grooming, locomotion level, and caring for others  PARTICIPATION LIMITATIONS: meal prep, cleaning, laundry, driving, shopping, community activity, occupation, and yard work  PERSONAL FACTORS: 1-2 comorbidities: scleroderma, Raynauds are also affecting patient's functional outcome.   REHAB POTENTIAL: Good  CLINICAL DECISION MAKING: Evolving/moderate complexity  EVALUATION COMPLEXITY: Moderate   GOALS: Goals reviewed with patient? No  SHORT TERM GOALS: Target date: 11/20/2023 Patient will be independent with performance of HEP to demonstrate adequate self management of symptoms.  Baseline:  Goal status: IN PROGRESS (has to make herself do it)  2.   Patient will report at least a 25% improvement with function or pain overall since beginning PT. Baseline:  Goal status: IN PROGRESS   LONG TERM GOALS: Target date: 12/11/2023  Patient will be independent in self management strategies to improve quality of life and functional outcomes.   Baseline:  Goal status: IN PROGRESS  2.  Patient will report 50% improvement overall  Baseline:  Goal status: IN PROGRESS  3.  Patient will be able to stand SLS x 40 each leg to demonstrate improved functional balance.   Baseline: right 30 and left 6 Goal status: MET  4.  Patient will improve LEFS score by 20 points to demonstrate improved perceived function  Baseline: 8/80 Goal status: MET  5.  Patient will improve 5 times sit to stand score to 20 sec or less to demonstrate improved functional mobility and increased leg strength.     Baseline: 27.34 sec; 6/18: 19.38 sec Goal status: MET    PLAN:  PT FREQUENCY: 1-2x/week  PT DURATION: 6 weeks  PLANNED INTERVENTIONS: 97164- PT Re-evaluation, 97110-Therapeutic exercises, 97530- Therapeutic activity, 97112- Neuromuscular re-education, 97535- Self Care, 40981- Manual therapy, [phone]- Gait training,  Patient/Family education, Balance training, Stair training, Dry Needling, Joint mobilization, Spinal mobilization, Vestibular training, and Moist heat  PLAN FOR NEXT SESSION: Progress general spinal and lower extremity mobility and strengthening, balance, functional strength. Request continuation of therapy.  7:39 AM, 12/13/23 Lorenso Romance, PTA/CLT [facility] Ph: [phone]

## 2023-12-14 ENCOUNTER — Encounter (HOSPITAL_COMMUNITY)

## 2023-12-15 ENCOUNTER — Telehealth: Payer: Self-pay | Admitting: Internal Medicine

## 2023-12-15 NOTE — Telephone Encounter (Unsigned)
 Copied from CRM [id number]. Topic: Clinical - Medication Refill >> Dec 15, 2023  8:11 AM Shelby Dessert H wrote: Medication: methotrexate  (RHEUMATREX) 2.5 MG tablet  Has the patient contacted their pharmacy? Yes (Agent: If no, request that the patient contact the pharmacy for the refill. If patient does not wish to contact the pharmacy document the reason why and proceed with request.) (Agent: If yes, when and what did the pharmacy advise?)  [facility] - [place], [place] - [address] [place] [address] Phone: [phone] Fax: [phone]  Is this the correct pharmacy for this prescription? Yes If no, delete pharmacy and type the correct one.   Has the prescription been filled recently? No  Is the patient out of the medication? Yes  Has the patient been seen for an appointment in the last year OR does the patient have an upcoming appointment? No  Can we respond through MyChart? Yes  Agent: Please be advised that Rx refills may take up to 3 business days. We ask that you follow-up with your pharmacy.

## 2023-12-18 NOTE — Telephone Encounter (Signed)
 NOT [facility] PATIENT

## 2023-12-18 NOTE — Progress Notes (Signed)
 Not an [facility] Patient.

## 2023-12-20 ENCOUNTER — Ambulatory Visit (HOSPITAL_COMMUNITY)

## 2023-12-20 DIAGNOSIS — R296 Repeated falls: Secondary | ICD-10-CM

## 2023-12-20 DIAGNOSIS — R262 Difficulty in walking, not elsewhere classified: Secondary | ICD-10-CM

## 2023-12-20 DIAGNOSIS — R2689 Other abnormalities of gait and mobility: Secondary | ICD-10-CM

## 2023-12-20 NOTE — Therapy (Signed)
 OUTPATIENT PHYSICAL THERAPY LOWER EXTREMITY TREATMENT   Patient Name: Erin Higgins MRN: [id number] DOB:January 11, 1971, 53 y.o., female Today's Date: 12/20/2023  END OF SESSION:  PT End of Session - 12/20/23 1537     Visit Number 5    Number of Visits 14    Date for PT Re-Evaluation 01/31/24    Authorization Type [employer]s; medicaid prepaid health    Authorization Time Period New auth req 6/25    Authorization - Visit Number 4    Authorization - Number of Visits 6    Progress Note Due on Visit 6    PT Start Time 1533    PT Stop Time 1600    PT Time Calculation (min) 27 min    Activity Tolerance Patient tolerated treatment well    Behavior During Therapy WFL for tasks assessed/performed           Past Medical History:  Diagnosis Date   Diverticulitis    Heartburn    Herpes simplex of female genitalia    Scleredema (HCC)    Tapeworm 04/2023   Past Surgical History:  Procedure Laterality Date   COLONOSCOPY     DILATION AND CURETTAGE OF UTERUS     TUBAL LIGATION  05/03/2012   Procedure: POST PARTUM TUBAL LIGATION;  Surgeon: Elveria Mungo, MD;  Location: [facility];  Service: Gynecology;  Laterality: Bilateral;  with filshie clips   Patient Active Problem List   Diagnosis Date Noted   Vision changes 06/22/2023   Herpes simplex of female genitalia    Lateral pain of left hip 03/01/2023   Unintentional weight loss of 10% body weight within 6 months 03/01/2023   Systemic sclerosis (HCC) 01/18/2023   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 Reports having a hard time with getting ready today due to upper body 10/10 pain.   Started having some falls in the winter of 2024 into 2025; latest fall was yesterday in the house; feet sometimes tingle and are numb from the Raynaud's.Sister had to assist her up. She reports just really stiff; has a hard time bending down to pick things up; one of her falls was with trying to pick something up.  Feels she is stiff and weak in general  PERTINENT HISTORY: Scleroderma; taking methotrexate  for not quite a year Raynauds Here previously with OT; hands still bother her   PAIN:  Are you having pain? Yes: NPRS scale: 6/10 Pain location: all over Pain description: tight and stiff and skin is pulling Aggravating factors: scleroderma Relieving factors: rest  PRECAUTIONS: Fall  RED FLAGS: None   WEIGHT BEARING RESTRICTIONS: No  FALLS:  Has patient fallen in last 6 months? Yes. 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  DIAGNOSTIC FINDINGS:   PATIENT SURVEYS:  LEFS evaluation:  12/13/23:  20% (at evaluation:  8/80 10%)  12/20/23: Lower Extremity Functional Score: 7 / 80 = 8.8 %  COGNITION: Overall cognitive status: Within functional limits for tasks  assessed     SENSATION: Hot/Cold: Raynauds  EDEMA:  Fingers are swollen  MUSCLE LENGTH: Hamstrings: Right  deg; Left  deg tight bilaterally   POSTURE: rounded shoulders and forward head  PALPATION: General tenderness  Noted tightness lumbar flexion and shoulder flexion  LOWER EXTREMITY ROM:  Active ROM Right eval Left eval  Hip flexion    Hip extension    Hip abduction    Hip adduction    Hip internal rotation    Hip external rotation    Knee flexion    Knee extension    Ankle dorsiflexion    Ankle plantarflexion    Ankle inversion    Ankle eversion     (Blank rows = not tested)  LOWER EXTREMITY MMT:  MMT Right eval Left eval Right 12/20/23 Left 12/20/23  Hip flexion 4+ 4    Hip extension 3 3 3+ 3+  Hip abduction 3- 3- 3+ 3+  Hip adduction      Hip internal rotation      Hip external rotation      Knee flexion      Knee extension 5 5    Ankle dorsiflexion 5 5    Ankle plantarflexion      Ankle inversion      Ankle eversion       (Blank rows = not tested)   FUNCTIONAL TESTS:  Evaluation: 5 times sit to stand: 27.34 sec using hands to push up Timed up and go (TUG):    17  on  12/06/23              SLS right 30 and left 6  12/13/23: Functional testing:  5X STS: 19.38  LEFS:  18/80=20%  SLS:  both >40 without UE assist   12/20/23: TUG: 13 seconds  GAIT: Distance walked: 50 ft in clinic Assistive device utilized: None Level of assistance: Modified independence Comments: slow gait speed                                                                                                                                TREATMENT DATE:  12/20/23: UBE, 4', level 1, forward LEFS TUG Hip Vectors: 3, 5x ea LE LE MMT: hip ext and abd Bridges: 10x2 STS: 10x, 10x w/ 5 lb. Tidal tank.   12/13/23: NuStep, seat 7, UE/LE  Atl beach level 3, 5' Standing: in // bars Heel raises on incline 20X Toe Raises on decline, 20X Hip abduction 10X2 each Hip  Extension 2x10 each Hamstring stretch on 12 step 2X30 each  Theraband, yellow Shoulder Ext + march: 10x2 Theraband, yellow rows 2X10 Theraband, yellow pallof press 2X10 each Vectors 10X3 each LE with 1 HHA Functional testing:  5X STS: 19.38  LEFS:  18/80=20%  SLS:  both >40 without UE assist  12/06/23: NuStep, seat 7, 5' Timed Up and Go STS: 2x10, v cues (see assessment) Heel raises 2x10, in // bars Toe Raises, 2x10, in // bars Hip abduction 10X2 each, in // bars Hip Extension 2x10, in // bars Shoulder Ext + march: 10x2, YTB, CGA Hamstring stretch, seated, 2x30    PATIENT EDUCATION:  Education details: PT evaluation, objective findings, POC, Importance of HEP, Precautions, Clinic policies  Person educated: Patient Education method: Explanation and Demonstration Education comprehension: verbalized understanding and returned demonstration  HOME EXERCISE PROGRAM: Access Code: [email or web address] URL: [email or web address] Date: 10/30/2023 Prepared by: AP - Rehab  Exercises - Sit to Stand  - 2 x daily - 7 x weekly - 1 sets - 10 reps - standing single leg balance at the counter (try to not hold on)  - 2 x daily - 7 x weekly - 1 sets - 5 reps - 30 sec hold - Seated 3 Way Exercise Ball Roll Out Stretch  - 2 x daily - 7 x weekly - 1 sets - 10 reps - Seated Hamstring Stretch  - 2 x daily - 7 x weekly - 1 sets - 5 reps - 20 sec hold  Date: 11/22/2023 - Standing Hip Abduction with Anterior Support  - 2 x daily - 7 x weekly - 2 sets - 10 reps - Standing Hip Extension with Chair  - 2 x daily - 7 x weekly - 3 sets - 10 reps - Standing Heel Raise with Support  - 2 x daily - 7 x weekly - 3 sets - 10 reps  ASSESSMENT:  CLINICAL IMPRESSION: Session limited due to patient late arrival. Patient reports not having such a great date today, upper body and extremities causing increased pain. Began with UBE to losing upper extremities. Followed with completion of LEFS, and TUG. TUG  score improving this date, demonstrating decreased fall risk. Improvements with LE strength this date as well via MMT. Remainder of session spent with LE strengthening exercises, pt tolerating well. Patient will benefit from continued skilled physical therapy services to address unmet goals and her remaining deficits to improve function/QOL.     Patient is a 53 y.o. female who was seen today for physical therapy evaluation and treatment for frequent falls.  Patient demonstrates decreased strength, balance deficits, ROM limitations and gait abnormalities which are negatively impacting patient ability to perform ADLs and functional mobility tasks. Patient will benefit from skilled physical therapy services to address these deficits to improve level of function with ADLs, functional mobility tasks, and reduce risk for falls.    OBJECTIVE IMPAIRMENTS: Abnormal gait, decreased balance, decreased mobility, difficulty walking, decreased ROM, decreased strength, hypomobility, increased fascial restrictions, impaired perceived functional ability, impaired sensation, and pain.   ACTIVITY LIMITATIONS: carrying, lifting, bending, sitting, standing, squatting, sleeping, stairs, transfers, bed mobility, bathing, toileting, dressing, reach over head, hygiene/grooming, locomotion level, and caring for others  PARTICIPATION LIMITATIONS: meal prep, cleaning, laundry, driving, shopping, community activity, occupation, and yard work  PERSONAL FACTORS: 1-2 comorbidities: scleroderma, Raynauds are also affecting patient's functional  outcome.   REHAB POTENTIAL: Good  CLINICAL DECISION MAKING: Evolving/moderate complexity  EVALUATION COMPLEXITY: Moderate   GOALS: Goals reviewed with patient? No  SHORT TERM GOALS: Target date: 01/03/2024 Patient will be independent with performance of HEP to demonstrate adequate self management of symptoms.  Baseline:  Goal status: IN PROGRESS (has to make herself do it)  2.    Patient will report at least a 25% improvement with function or pain overall since beginning PT. Baseline:  Goal status: IN PROGRESS   LONG TERM GOALS: Target date: 01/24/2024  Patient will be independent in self management strategies to improve quality of life and functional outcomes.   Baseline:  Goal status: IN PROGRESS  2.  Patient will report 50% improvement overall  Baseline:  Goal status: IN PROGRESS  3.  Patient will be able to stand SLS x 40 each leg to demonstrate improved functional balance.   Baseline: right 30 and left 6 Goal status: MET  4.  Patient will improve LEFS score by 20 points to demonstrate improved perceived function  Baseline: 8/80 Goal status: MET  5.  Patient will improve 5 times sit to stand score to 20 sec or less to demonstrate improved functional mobility and increased leg strength.     Baseline: 27.34 sec; 6/18: 19.38 sec Goal status: MET    PLAN:  PT FREQUENCY: 1-2x/week  PT DURATION: 6 weeks  PLANNED INTERVENTIONS: 97164- PT Re-evaluation, 97110-Therapeutic exercises, 97530- Therapeutic activity, 97112- Neuromuscular re-education, 97535- Self Care, 02859- Manual therapy, [phone]- Gait training, Patient/Family education, Balance training, Stair training, Dry Needling, Joint mobilization, Spinal mobilization, Vestibular training, and Moist heat  PLAN FOR NEXT SESSION: Progress general spinal and lower extremity mobility and strengthening, balance, functional strength.    5:09 PM, 12/20/23 Ammy Lienhard Powell-Butler, PT, DPT [facility] [place]   Managed Medicaid Authorization Request  Visit Dx Codes:  R29.6 R26.2 R26.89  Functional Tool Score:  TUG: 13 seconds Lower Extremity Functional Score: 7 / 80 = 8.8 %  For all possible CPT codes, reference the Planned Interventions line above.     Check all conditions that are expected to impact treatment: {Conditions expected to impact treatment:None of these  apply   If treatment provided at initial evaluation, no treatment charged due to lack of authorization.

## 2023-12-20 NOTE — Addendum Note (Signed)
 Addended by: POWELL-BUTLER, Arsen Mangione E on: 12/20/2023 05:39 PM   Modules accepted: Orders

## 2023-12-22 ENCOUNTER — Ambulatory Visit (INDEPENDENT_AMBULATORY_CARE_PROVIDER_SITE_OTHER): Admitting: Internal Medicine

## 2023-12-22 ENCOUNTER — Encounter: Payer: Self-pay | Admitting: Internal Medicine

## 2023-12-22 VITALS — BP 122/81 | HR 71 | Resp 16 | Ht 65.0 in | Wt 111.2 lb

## 2023-12-22 DIAGNOSIS — M349 Systemic sclerosis, unspecified: Secondary | ICD-10-CM

## 2023-12-22 DIAGNOSIS — M25552 Pain in left hip: Secondary | ICD-10-CM

## 2023-12-22 DIAGNOSIS — M79642 Pain in left hand: Secondary | ICD-10-CM

## 2023-12-22 DIAGNOSIS — Z79899 Other long term (current) drug therapy: Secondary | ICD-10-CM | POA: Diagnosis not present

## 2023-12-22 DIAGNOSIS — R296 Repeated falls: Secondary | ICD-10-CM | POA: Diagnosis not present

## 2023-12-22 DIAGNOSIS — H539 Unspecified visual disturbance: Secondary | ICD-10-CM

## 2023-12-22 DIAGNOSIS — R634 Abnormal weight loss: Secondary | ICD-10-CM

## 2023-12-22 DIAGNOSIS — L943 Sclerodactyly: Secondary | ICD-10-CM

## 2023-12-22 DIAGNOSIS — I7300 Raynaud's syndrome without gangrene: Secondary | ICD-10-CM

## 2023-12-22 DIAGNOSIS — M79641 Pain in right hand: Secondary | ICD-10-CM

## 2023-12-22 DIAGNOSIS — K1379 Other lesions of oral mucosa: Secondary | ICD-10-CM

## 2023-12-22 DIAGNOSIS — M0600 Rheumatoid arthritis without rheumatoid factor, unspecified site: Secondary | ICD-10-CM

## 2023-12-22 MED ORDER — METHOTREXATE SODIUM 2.5 MG PO TABS
20.0000 mg | ORAL_TABLET | ORAL | 0 refills | Status: DC
Start: 2023-12-22 — End: 2024-04-23

## 2023-12-25 LAB — CBC WITH DIFFERENTIAL/PLATELET
Absolute Lymphocytes: 1467 {cells}/uL (ref 850–3900)
Absolute Monocytes: 486 {cells}/uL (ref 200–950)
Basophils Absolute: 9 {cells}/uL (ref 0–200)
Basophils Relative: 0.2 %
Eosinophils Absolute: 18 {cells}/uL (ref 15–500)
Eosinophils Relative: 0.4 %
HCT: 34.8 % — ABNORMAL LOW (ref 35.0–45.0)
Hemoglobin: 10.9 g/dL — ABNORMAL LOW (ref 11.7–15.5)
MCH: 27.6 pg (ref 27.0–33.0)
MCHC: 31.3 g/dL — ABNORMAL LOW (ref 32.0–36.0)
MCV: 88.1 fL (ref 80.0–100.0)
MPV: 10.5 fL (ref 7.5–12.5)
Monocytes Relative: 10.8 %
Neutro Abs: 2520 {cells}/uL (ref 1500–7800)
Neutrophils Relative %: 56 %
Platelets: 298 10*3/uL (ref 140–400)
RBC: 3.95 10*6/uL (ref 3.80–5.10)
RDW: 14.4 % (ref 11.0–15.0)
Total Lymphocyte: 32.6 %
WBC: 4.5 10*3/uL (ref 3.8–10.8)

## 2023-12-25 LAB — COMPREHENSIVE METABOLIC PANEL WITH GFR
AG Ratio: 1.1 (calc) (ref 1.0–2.5)
ALT: 9 U/L (ref 6–29)
AST: 18 U/L (ref 10–35)
Albumin: 4 g/dL (ref 3.6–5.1)
Alkaline phosphatase (APISO): 49 U/L (ref 37–153)
BUN/Creatinine Ratio: 28 (calc) — ABNORMAL HIGH (ref 6–22)
BUN: 11 mg/dL (ref 7–25)
CO2: 26 mmol/L (ref 20–32)
Calcium: 9.4 mg/dL (ref 8.6–10.4)
Chloride: 105 mmol/L (ref 98–110)
Creat: 0.39 mg/dL — ABNORMAL LOW (ref 0.50–1.03)
Globulin: 3.6 g/dL (ref 1.9–3.7)
Glucose, Bld: 90 mg/dL (ref 65–99)
Potassium: 4.3 mmol/L (ref 3.5–5.3)
Sodium: 138 mmol/L (ref 135–146)
Total Bilirubin: 0.7 mg/dL (ref 0.2–1.2)
Total Protein: 7.6 g/dL (ref 6.1–8.1)
eGFR: 121 mL/min/{1.73_m2} (ref >=60)

## 2023-12-25 LAB — RHEUMATOID FACTOR: Rheumatoid fact SerPl-aCnc: <10 [IU]/mL (ref <14)

## 2023-12-25 LAB — CYCLIC CITRUL PEPTIDE ANTIBODY, IGG: Cyclic Citrullin Peptide Ab: <16 U

## 2023-12-25 LAB — SEDIMENTATION RATE: Sed Rate: 33 mm/h — ABNORMAL HIGH (ref 0–20)

## 2023-12-27 ENCOUNTER — Ambulatory Visit (HOSPITAL_COMMUNITY): Attending: Internal Medicine

## 2023-12-27 ENCOUNTER — Encounter (HOSPITAL_COMMUNITY): Payer: Self-pay

## 2023-12-27 DIAGNOSIS — R2689 Other abnormalities of gait and mobility: Secondary | ICD-10-CM | POA: Insufficient documentation

## 2023-12-27 DIAGNOSIS — R262 Difficulty in walking, not elsewhere classified: Secondary | ICD-10-CM | POA: Insufficient documentation

## 2023-12-27 DIAGNOSIS — R296 Repeated falls: Secondary | ICD-10-CM | POA: Diagnosis present

## 2023-12-27 NOTE — Therapy (Signed)
 OUTPATIENT PHYSICAL THERAPY LOWER EXTREMITY TREATMENT   Patient Name: Erin Higgins MRN: [id number] DOB:06-27-1971, 53 y.o., female Today's Date: 12/27/2023  END OF SESSION:  PT End of Session - 12/27/23 1441     Visit Number 6    Number of Visits 18    Date for PT Re-Evaluation 01/31/24    Authorization Type [employer]s; medicaid prepaid health    Authorization Time Period 12 visits approved after 6/25    Authorization - Number of Visits 6    Progress Note Due on Visit 6    PT Start Time 1441    PT Stop Time 1515    PT Time Calculation (min) 34 min    Activity Tolerance Patient tolerated treatment well    Behavior During Therapy WFL for tasks assessed/performed           Past Medical History:  Diagnosis Date   Diverticulitis    Heartburn    Herpes simplex of female genitalia    Scleredema (HCC)    Tapeworm 04/2023   Past Surgical History:  Procedure Laterality Date   COLONOSCOPY     DILATION AND CURETTAGE OF UTERUS     TOOTH EXTRACTION  08/2023   TUBAL LIGATION  05/03/2012   Procedure: POST PARTUM TUBAL LIGATION;  Surgeon: Elveria Mungo, MD;  Location: [facility];  Service: Gynecology;  Laterality: Bilateral;  with filshie clips   Patient Active Problem List   Diagnosis Date Noted   Vision changes 06/22/2023   Herpes simplex of female genitalia    Lateral pain of left hip 03/01/2023   Unintentional weight loss of 10% body weight within 6 months 03/01/2023   Systemic sclerosis (HCC) 01/18/2023   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 Reports no falls since last visit. 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 Reports just discomfort this day. No real pain.    Started having some falls in the winter of 2024 into 2025; latest fall was yesterday in the house; feet sometimes tingle and are numb from the Raynaud's.Sister had to assist her up. She reports just really stiff; has a hard time bending down to pick things up; one of her falls was with trying to pick something up.  Feels she is stiff and weak in general  PERTINENT HISTORY: Scleroderma; taking methotrexate  for not quite a year Raynauds Here previously with OT; hands still bother her   PAIN:  Are you having pain? Yes: NPRS scale: 6/10 Pain location: all over Pain description: tight and stiff and skin is pulling Aggravating factors: scleroderma Relieving factors: rest  PRECAUTIONS: Fall  RED FLAGS: None   WEIGHT BEARING RESTRICTIONS: No  FALLS:  Has patient fallen in last 6 months? Yes. 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  DIAGNOSTIC FINDINGS:   PATIENT SURVEYS:  LEFS evaluation:  12/13/23:  20% (at evaluation:  8/80 10%)  12/20/23: Lower Extremity Functional Score: 7 / 80 = 8.8  %  COGNITION: Overall cognitive status: Within functional limits for tasks assessed     SENSATION: Hot/Cold: Raynauds  EDEMA:  Fingers are swollen  MUSCLE LENGTH: Hamstrings: Right  deg; Left  deg tight bilaterally   POSTURE: rounded shoulders and forward head  PALPATION: General tenderness  Noted tightness lumbar flexion and shoulder flexion  LOWER EXTREMITY ROM:  Active ROM Right eval Left eval  Hip flexion    Hip extension    Hip abduction    Hip adduction    Hip internal rotation    Hip external rotation    Knee flexion    Knee extension    Ankle dorsiflexion    Ankle plantarflexion    Ankle inversion    Ankle eversion     (Blank rows = not tested)  LOWER EXTREMITY MMT:  MMT Right eval Left eval Right 12/20/23 Left 12/20/23  Hip flexion 4+ 4    Hip extension 3 3 3+ 3+  Hip abduction 3- 3- 3+ 3+  Hip adduction      Hip internal rotation      Hip external rotation      Knee flexion      Knee extension 5 5    Ankle dorsiflexion 5 5    Ankle plantarflexion      Ankle inversion      Ankle eversion       (Blank rows = not tested)   FUNCTIONAL TESTS:  Evaluation: 5 times sit to stand: 27.34 sec using hands to push up Timed up and go (TUG):    17  on  12/06/23              SLS right 30 and left 6  12/13/23: Functional testing:  5X STS: 19.38  LEFS:  18/80=20%  SLS:  both >40 without UE assist   12/20/23: TUG: 13 seconds  GAIT: Distance walked: 50 ft in clinic Assistive device utilized: None Level of assistance: Modified independence Comments: slow gait speed                                                                                                                                TREATMENT DATE:  12/27/23:  Recumbent bike, seat 9, 5' Step up/overs, 6 inch step, 10x ascending with R+ descending with L  10x ascending with L+ descending  with R Lateral step overs, 6 inch step, 10x over and back Backwards lunges, feet on 2 inch step,  2x10 Piriformis stretch, 2x30 bilat Side-lying TFL stretch, 1' each side   12/20/23: UBE, 4', level 1, forward LEFS TUG Hip Vectors: 3, 5x ea LE LE MMT: hip ext and abd Bridges: 10x2 STS: 10x, 10x w/ 5 lb. Tidal tank.   12/13/23: NuStep, seat 7, UE/LE  Atl beach level 3, 5' Standing: in // bars Heel raises on incline 20X Toe Raises on decline, 20X Hip abduction 10X2 each Hip Extension 2x10 each Hamstring stretch on 12 step 2X30 each  Theraband, yellow Shoulder Ext + march: 10x2 Theraband, yellow rows 2X10 Theraband, yellow pallof press 2X10 each Vectors 10X3 each LE with 1 HHA Functional testing:  5X STS: 19.38  LEFS:  18/80=20%  SLS:  both >40 without UE assist   PATIENT EDUCATION:  Education details: PT evaluation, objective findings, POC, Importance of HEP, Precautions, Clinic policies  Person educated: Patient Education method: Explanation and Demonstration Education comprehension: verbalized understanding and returned demonstration  HOME EXERCISE PROGRAM: Access Code: [email or web address] URL: [email or web address] Date: 10/30/2023 Prepared by: [facility] - Rehab  Exercises - Sit to Stand  - 2 x daily - 7 x weekly - 1 sets - 10 reps - standing single leg balance at the counter (try to not hold on)  - 2 x daily - 7 x weekly - 1 sets - 5 reps - 30 sec hold - Seated 3 Way Exercise [employer] Out Stretch  - 2 x daily - 7 x weekly - 1 sets - 10 reps - Seated Hamstring Stretch  - 2 x daily - 7 x weekly - 1 sets - 5 reps - 20 sec hold  Date: 11/22/2023 - Standing Hip Abduction with Anterior Support  - 2 x daily - 7 x weekly - 2 sets - 10 reps - Standing Hip Extension with Chair  - 2 x daily - 7 x weekly - 3 sets - 10 reps - Standing Heel Raise with Support  - 2 x daily - 7 x weekly - 3 sets - 10 reps   Access Code: [email or web address] URL: [email or web address] Date: 12/27/2023 Prepared by: Rosaria Powell-Butler  Exercises - Sidelying TFL Stretch  - 2 x daily  - 7 x weekly - 3 sets - 1 min hold - Supine Piriformis Stretch with Foot on Ground  - 2 x daily - 7 x weekly - 3 sets - 30 hold    ASSESSMENT:  CLINICAL IMPRESSION: Patient tolerated session well. Limited due to patient late arrival. Began session with general warm up on recumbent bike. Followed with combining general LE strengthening and balance with reducing UE support. Patient demo good carryover throughout all but has to take seated rest breaks between. Patient reporting discomfort in R hip when in side-lying position. Education on possible reasons but that continuing to be mobile as the best treatment. Patient shown piriformis stretch and TFL stretch with hopes of mobilizing all possible causes, including hip joint/muscles, SIJ, or bursa. Added to HEP.  Patient will benefit from continued skilled physical therapy services to address unmet goals and her remaining deficits to improve function/QOL.    Patient is a 53 y.o. female who was seen today for physical therapy evaluation and treatment for frequent falls.  Patient demonstrates decreased strength, balance deficits, ROM limitations and gait abnormalities which are negatively impacting patient ability to perform ADLs and functional mobility tasks. Patient will benefit from skilled physical therapy services to  address these deficits to improve level of function with ADLs, functional mobility tasks, and reduce risk for falls.    OBJECTIVE IMPAIRMENTS: Abnormal gait, decreased balance, decreased mobility, difficulty walking, decreased ROM, decreased strength, hypomobility, increased fascial restrictions, impaired perceived functional ability, impaired sensation, and pain.   ACTIVITY LIMITATIONS: carrying, lifting, bending, sitting, standing, squatting, sleeping, stairs, transfers, bed mobility, bathing, toileting, dressing, reach over head, hygiene/grooming, locomotion level, and caring for others  PARTICIPATION LIMITATIONS: meal prep,  cleaning, laundry, driving, shopping, community activity, occupation, and yard work  PERSONAL FACTORS: 1-2 comorbidities: scleroderma, Raynauds are also affecting patient's functional outcome.   REHAB POTENTIAL: Good  CLINICAL DECISION MAKING: Evolving/moderate complexity  EVALUATION COMPLEXITY: Moderate   GOALS: Goals reviewed with patient? No  SHORT TERM GOALS: Target date: 01/03/2024 Patient will be independent with performance of HEP to demonstrate adequate self management of symptoms.  Baseline:  Goal status: IN PROGRESS (has to make herself do it)  2.   Patient will report at least a 25% improvement with function or pain overall since beginning PT. Baseline:  Goal status: IN PROGRESS   LONG TERM GOALS: Target date: 01/24/2024  Patient will be independent in self management strategies to improve quality of life and functional outcomes.   Baseline:  Goal status: IN PROGRESS  2.  Patient will report 50% improvement overall  Baseline:  Goal status: IN PROGRESS  3.  Patient will be able to stand SLS x 40 each leg to demonstrate improved functional balance.   Baseline: right 30 and left 6 Goal status: MET  4.  Patient will improve LEFS score by 20 points to demonstrate improved perceived function  Baseline: 8/80 Goal status: MET  5.  Patient will improve 5 times sit to stand score to 20 sec or less to demonstrate improved functional mobility and increased leg strength.     Baseline: 27.34 sec; 6/18: 19.38 sec Goal status: MET    PLAN:  PT FREQUENCY: 1-2x/week  PT DURATION: 6 weeks  PLANNED INTERVENTIONS: 97164- PT Re-evaluation, 97110-Therapeutic exercises, 97530- Therapeutic activity, 97112- Neuromuscular re-education, 97535- Self Care, 02859- Manual therapy, [phone]- Gait training, Patient/Family education, Balance training, Stair training, Dry Needling, Joint mobilization, Spinal mobilization, Vestibular training, and Moist heat  PLAN FOR NEXT SESSION:  Progress general spinal and lower extremity mobility and strengthening, balance, functional strength.   3:38 PM, 12/27/23 Rosaria Settler, PT, DPT [facility] [place]

## 2024-01-09 ENCOUNTER — Other Ambulatory Visit: Payer: Self-pay | Admitting: Internal Medicine

## 2024-01-09 DIAGNOSIS — M349 Systemic sclerosis, unspecified: Secondary | ICD-10-CM

## 2024-01-10 ENCOUNTER — Encounter (HOSPITAL_COMMUNITY)

## 2024-01-10 ENCOUNTER — Telehealth (HOSPITAL_COMMUNITY): Payer: Self-pay

## 2024-01-10 ENCOUNTER — Encounter (HOSPITAL_COMMUNITY): Payer: Self-pay

## 2024-01-10 NOTE — Telephone Encounter (Signed)
 NS 1 Called patient concerning missed appointment this date. No answer. Left voicemail with reminder of next scheduled visit on 07/24.   1:39 PM, 01/10/24 Rosaria Settler, PT, DPT [facility] [place]

## 2024-01-11 ENCOUNTER — Encounter: Payer: Self-pay | Admitting: Internal Medicine

## 2024-01-18 ENCOUNTER — Ambulatory Visit (HOSPITAL_COMMUNITY)

## 2024-01-18 DIAGNOSIS — R262 Difficulty in walking, not elsewhere classified: Secondary | ICD-10-CM

## 2024-01-18 DIAGNOSIS — R2689 Other abnormalities of gait and mobility: Secondary | ICD-10-CM

## 2024-01-18 DIAGNOSIS — R296 Repeated falls: Secondary | ICD-10-CM | POA: Diagnosis not present

## 2024-01-18 NOTE — Therapy (Signed)
 OUTPATIENT PHYSICAL THERAPY LOWER EXTREMITY TREATMENT   Patient Name: Erin Higgins MRN: [id number] DOB:1971/05/19, 53 y.o., female Today's Date: 01/18/2024  END OF SESSION:  PT End of Session - 01/18/24 1110     Visit Number 7    Number of Visits 18    Date for PT Re-Evaluation 01/31/24    Authorization Type [employer]; medicaid prepaid health    Authorization Time Period 12 visits approved after 6/25    PT Start Time 1110    PT Stop Time 1145    PT Time Calculation (min) 35 min    Activity Tolerance Patient tolerated treatment well    Behavior During Therapy WFL for tasks assessed/performed           Past Medical History:  Diagnosis Date   Diverticulitis    Heartburn    Herpes simplex of female genitalia    Scleredema (HCC)    Tapeworm 04/2023   Past Surgical History:  Procedure Laterality Date   COLONOSCOPY     DILATION AND CURETTAGE OF UTERUS     TOOTH EXTRACTION  08/2023   TUBAL LIGATION  05/03/2012   Procedure: POST PARTUM TUBAL LIGATION;  Surgeon: Elveria Mungo, MD;  Location: [facility];  Service: Gynecology;  Laterality: Bilateral;  with filshie clips   Patient Active Problem List   Diagnosis Date Noted   Vision changes 06/22/2023   Herpes simplex of female genitalia    Lateral pain of left hip 03/01/2023   Unintentional weight loss of 10% body weight within 6 months 03/01/2023   Systemic sclerosis (HCC) 01/18/2023   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 Pt reports last week she had to cancel because she had some inflammation. Thinks it was an allergic reaction. Her face was really swollen. Other than that, she's been trying to keep moving. HEP been going well. Reports she still having trouble with picking things up from floor because stiffness, and fear of falling.   Started having some falls in the winter of 2024 into 2025; latest fall was yesterday in the house; feet sometimes tingle and are numb from the Raynaud's.Sister had to assist her up. She reports just really stiff; has a hard time bending down to pick things up; one of her falls was with trying to pick something up.  Feels she is stiff and weak in general  PERTINENT HISTORY: Scleroderma; taking methotrexate  for not quite a year Raynauds Here previously with OT; hands still bother her   PAIN:  Are you having pain? Yes: NPRS scale: 6/10 Pain location: all over Pain description: tight and stiff and skin is pulling Aggravating factors: scleroderma Relieving factors: rest  PRECAUTIONS: Fall  RED FLAGS: None   WEIGHT BEARING RESTRICTIONS: No  FALLS:  Has patient fallen in last 6 months? Yes. 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  DIAGNOSTIC FINDINGS:   PATIENT SURVEYS:  LEFS evaluation:  12/13/23:  20%  (at evaluation:  8/80 10%)  12/20/23: Lower Extremity Functional Score: 7 / 80 = 8.8 %  COGNITION: Overall cognitive status: Within functional limits for tasks assessed     SENSATION: Hot/Cold: Raynauds  EDEMA:  Fingers are swollen  MUSCLE LENGTH: Hamstrings: Right  deg; Left  deg tight bilaterally   POSTURE: rounded shoulders and forward head  PALPATION: General tenderness  Noted tightness lumbar flexion and shoulder flexion  LOWER EXTREMITY ROM:  Active ROM Right eval Left eval  Hip flexion    Hip extension    Hip abduction    Hip adduction    Hip internal rotation    Hip external rotation    Knee flexion    Knee extension    Ankle dorsiflexion    Ankle plantarflexion    Ankle inversion    Ankle eversion     (Blank rows = not tested)  LOWER EXTREMITY MMT:  MMT Right eval Left eval Right 12/20/23 Left 12/20/23  Hip flexion 4+ 4    Hip extension 3 3 3+ 3+  Hip abduction 3- 3- 3+ 3+  Hip adduction      Hip internal rotation      Hip external rotation      Knee flexion      Knee extension 5 5    Ankle dorsiflexion 5 5    Ankle plantarflexion      Ankle inversion      Ankle eversion       (Blank rows = not tested)   FUNCTIONAL TESTS:  Evaluation: 5 times sit to stand: 27.34 sec using hands to push up Timed up and go (TUG):    17  on  12/06/23              SLS right 30 and left 6  12/13/23: Functional testing:  5X STS: 19.38  LEFS:  18/80=20%  SLS:  both >40 without UE assist   12/20/23: TUG: 13 seconds  GAIT: Distance walked: 50 ft in clinic Assistive device utilized: None Level of assistance: Modified independence Comments: slow gait speed                                                                                                                                TREATMENT DATE:  Treadmill: 1.0 mph for 2 min, 1.3 mph for 2 min  Deep squats to pt  max, aiming for flat things on floor, 10x, pt unable to reach floor level due to  tightness Hamstring Stretch, 45x2 QL stretch, doorway , 45x1,  side-lying, QL/TFL 45x1 AAROM Shoulder flexion w/ physioball, 5x  10x w/ towel   12/27/23:  Recumbent bike, seat 9, 5' Step up/overs, 6 inch step, 10x ascending with R+ descending with L  10x ascending with L+ descending with R Lateral step overs, 6 inch step, 10x over and back Backwards lunges, feet on 2 inch step, 2x10 Piriformis stretch, 2x30 bilat Side-lying TFL stretch, 1' each side   12/20/23: UBE, 4', level 1, forward LEFS TUG Hip Vectors: 3, 5x ea LE LE MMT: hip ext and abd Bridges: 10x2 STS: 10x, 10x w/ 5 lb. Tidal tank.    PATIENT EDUCATION:  Education details: PT evaluation, objective findings, POC, Importance of HEP, Precautions, Clinic policies  Person educated: Patient Education method: Explanation and Demonstration Education comprehension: verbalized understanding and returned demonstration  HOME EXERCISE PROGRAM: Access Code: [email or web address] URL: [email or web address] Date: 10/30/2023 Prepared by: [facility] - Rehab  Exercises - Sit to Stand  - 2 x daily - 7 x weekly - 1 sets - 10 reps - standing single leg balance at the counter (try to not hold on)  - 2 x daily - 7 x weekly - 1 sets - 5 reps - 30 sec hold - Seated 3 Way Exercise [employer] Out Stretch  - 2 x daily - 7 x weekly - 1 sets - 10 reps - Seated Hamstring Stretch  - 2 x daily - 7 x weekly - 1 sets - 5 reps - 20 sec hold  Date: 11/22/2023 - Standing Hip Abduction with Anterior Support  - 2 x daily - 7 x weekly - 2 sets - 10 reps - Standing Hip Extension with Chair  - 2 x daily - 7 x weekly - 3 sets - 10 reps - Standing Heel Raise with Support  - 2 x daily - 7 x weekly - 3 sets - 10 reps   Access Code: [email or web address] URL: [email or web address] Date: 12/27/2023 Prepared by: Rosaria Powell-Butler  Exercises - Sidelying TFL Stretch  - 2 x daily - 7 x weekly - 3 sets - 1 min hold - Supine Piriformis Stretch with Foot on  Ground  - 2 x daily - 7 x weekly - 3 sets - 30 hold  Access Code: [email or web address] URL: [email or web address] Date: 01/18/2024 Prepared by: Rosaria Powell-Butler  Exercises - Standing Quadratus Lumborum Stretch with Doorway  - 2 x daily - 7 x weekly - 3 sets - 45 hold - Standing Single Arm Shoulder Flexion Stretch on Wall  - 2 x daily - 7 x weekly - 3 sets - 10 reps - 10 hold  ASSESSMENT:  CLINICAL IMPRESSION: Session limited due to patient late arrival. Began on treadmill for general full body warm up, reaching 1.3 mph. Patient reports having issue with picking up flat objects from the floor due to tightness in triceps, lower traps, latissimus dorsi, hamstrings, and Quadratus Lumborum musculature areas. Education and Instruction given for WBOS and using squat technique rather than leaning forward to decrease risk of fall.  Patient given new stretches and AAROM to address this date and tolerated well in session. Patient will benefit from continued skilled physical therapy services to address unmet goals and her remaining deficits to improve function/QOL.     EVAL: Patient is a 53 y.o. female who was seen today for physical therapy evaluation and treatment for frequent falls.  Patient demonstrates decreased strength, balance deficits, ROM limitations and gait abnormalities which are negatively impacting patient ability to perform ADLs and functional mobility tasks. Patient will benefit from skilled physical therapy services to address these deficits to improve level of function with ADLs, functional mobility tasks, and reduce risk for falls.    OBJECTIVE IMPAIRMENTS: Abnormal gait, decreased balance, decreased mobility, difficulty walking, decreased ROM, decreased strength, hypomobility, increased fascial restrictions, impaired perceived functional ability, impaired sensation, and pain.   ACTIVITY LIMITATIONS: carrying, lifting, bending, sitting, standing, squatting, sleeping, stairs,  transfers, bed mobility, bathing, toileting, dressing, reach over head, hygiene/grooming, locomotion level, and caring for others  PARTICIPATION LIMITATIONS: meal prep, cleaning, laundry, driving, shopping, community activity, occupation, and yard work  PERSONAL FACTORS: 1-2 comorbidities: scleroderma, Raynauds are also affecting patient's functional outcome.   REHAB POTENTIAL: Good  CLINICAL DECISION MAKING: Evolving/moderate complexity  EVALUATION COMPLEXITY: Moderate   GOALS: Goals reviewed with patient? No  SHORT TERM GOALS: Target date: 01/03/2024 Patient will be independent with performance of HEP to demonstrate adequate self management of symptoms.  Baseline:  Goal status: IN PROGRESS (has to make herself do it)  2.   Patient will report at least a 25% improvement with function or pain overall since beginning PT. Baseline:  Goal status: IN PROGRESS   LONG TERM GOALS: Target date: 01/24/2024  Patient will be independent in self management strategies to improve quality of life and functional outcomes.   Baseline:  Goal status: IN PROGRESS  2.  Patient will report 50% improvement overall  Baseline:  Goal status: IN PROGRESS  3.  Patient will be able to stand SLS x 40 each leg to demonstrate improved functional balance.   Baseline: right 30 and left 6 Goal status: MET  4.  Patient will improve LEFS score by 20 points to demonstrate improved perceived function  Baseline: 8/80 Goal status: MET  5.  Patient will improve 5 times sit to stand score to 20 sec or less to demonstrate improved functional mobility and increased leg strength.     Baseline: 27.34 sec; 6/18: 19.38 sec Goal status: MET    PLAN:  PT FREQUENCY: 1-2x/week  PT DURATION: 6 weeks  PLANNED INTERVENTIONS: 97164- PT Re-evaluation, 97110-Therapeutic exercises, 97530- Therapeutic activity, 97112- Neuromuscular re-education, 97535- Self Care, [id number]- Manual therapy, [phone]- Gait training,  Patient/Family education, Balance training, Stair training, Dry Needling, Joint mobilization, Spinal mobilization, Vestibular training, and Moist heat  PLAN FOR NEXT SESSION: Progress general spinal and lower extremity mobility and strengthening, balance, functional strength.    11:46 AM, 01/18/24 Rosaria Settler, PT, DPT [facility] [place]

## 2024-01-25 ENCOUNTER — Encounter (HOSPITAL_COMMUNITY): Admitting: Physical Therapy

## 2024-01-25 ENCOUNTER — Telehealth (HOSPITAL_COMMUNITY): Payer: Self-pay | Admitting: Physical Therapy

## 2024-01-25 NOTE — Telephone Encounter (Signed)
Pt did not show for appt.  Called and left VM regarding missed appt and reminder for next scheduled one.  Teena Irani, PTA/CLT [place] Ph: [phone]

## 2024-02-01 ENCOUNTER — Ambulatory Visit (HOSPITAL_COMMUNITY): Attending: Internal Medicine

## 2024-02-01 ENCOUNTER — Telehealth: Payer: Self-pay | Admitting: Internal Medicine

## 2024-02-01 ENCOUNTER — Encounter (HOSPITAL_COMMUNITY): Payer: Self-pay

## 2024-02-01 DIAGNOSIS — R262 Difficulty in walking, not elsewhere classified: Secondary | ICD-10-CM | POA: Diagnosis present

## 2024-02-01 DIAGNOSIS — R296 Repeated falls: Secondary | ICD-10-CM | POA: Diagnosis present

## 2024-02-01 DIAGNOSIS — R29898 Other symptoms and signs involving the musculoskeletal system: Secondary | ICD-10-CM | POA: Diagnosis present

## 2024-02-01 DIAGNOSIS — R2689 Other abnormalities of gait and mobility: Secondary | ICD-10-CM | POA: Insufficient documentation

## 2024-02-01 NOTE — Therapy (Signed)
 OUTPATIENT PHYSICAL THERAPY LOWER EXTREMITY TREATMENT/RE-EVAL   Patient Name: Erin Higgins MRN: [id number] DOB:[birth year], 53 y.o., female Today's Date: 02/01/2024   Progress Note Reporting Period 12/13/23 to 02/01/24  See note below for Objective Data and Assessment of Progress/Goals.     END OF SESSION:  PT End of Session - 02/01/24 1020     Visit Number 8    Number of Visits 18    Date for PT Re-Evaluation 01/31/24    Authorization Type [employer]; medicaid prepaid health    Authorization Time Period More visits requested 02/01/24    PT Start Time 1023    PT Stop Time 1100    PT Time Calculation (min) 37 min    Activity Tolerance Patient tolerated treatment well    Behavior During Therapy WFL for tasks assessed/performed           Past Medical History:  Diagnosis Date   Diverticulitis    Heartburn    Herpes simplex of female genitalia    Scleredema (HCC)    Tapeworm 04/2023   Past Surgical History:  Procedure Laterality Date   COLONOSCOPY     DILATION AND CURETTAGE OF UTERUS     TOOTH EXTRACTION  08/2023   TUBAL LIGATION  05/03/2012   Procedure: POST PARTUM TUBAL LIGATION;  Surgeon: Elveria Mungo, MD;  Location: [facility];  Service: Gynecology;  Laterality: Bilateral;  with filshie clips   Patient Active Problem List   Diagnosis Date Noted   Vision changes 06/22/2023   Herpes simplex of female genitalia    Lateral pain of left hip 03/01/2023   Unintentional weight loss of 10% body weight within 6 months 03/01/2023   Systemic sclerosis (HCC) 01/18/2023   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   Vitamin D deficiency 12/19/2022   Rash and other nonspecific skin eruption 12/19/2022   Chest pain of uncertain etiology 08/08/2022   Raynaud phenomenon 08/08/2022   Encounter for sterilization 05/03/2012    PCP: Shona Norleen PEDLAR, MD  REFERRING PROVIDER: Joeann Browning, FNP  REFERRING DIAG: Frequent falls  THERAPY DIAG:  Repeated falls  Difficulty in walking, not  elsewhere classified  Other abnormalities of gait and mobility  Rationale for Evaluation and Treatment: Rehabilitation  ONSET DATE: 04/2023  SUBJECTIVE:   SUBJECTIVE STATEMENT: Patient reports increased pain this date, wants to say 11/10. Reports her whole body feels like a pulled muscle, all over. Would like to continue therapy because she feels best after she leaves her sessions.    EVAL:Started having some falls in the winter of 2024 into 2025; latest fall was yesterday in the house; feet sometimes tingle and are numb from the Raynaud's.Sister had to assist her up. She reports just really stiff; has a hard time bending down to pick things up; one of her falls was with trying to pick something up.  Feels she is stiff and weak in general  PERTINENT HISTORY: Scleroderma; taking methotrexate  for not quite a year Raynauds Here previously with OT; hands still bother her   PAIN:  Are you having pain? Yes: NPRS scale: 6/10 Pain location: all over Pain description: tight and stiff and skin is pulling Aggravating factors: scleroderma Relieving factors: rest  PRECAUTIONS: Fall  RED FLAGS: None   WEIGHT BEARING RESTRICTIONS: No  FALLS:  Has patient fallen in last 6 months? Yes. 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  DIAGNOSTIC FINDINGS:   PATIENT SURVEYS:  LEFS evaluation:  12/13/23:  20% (at  evaluation:  8/80 10%)  12/20/23: Lower Extremity Functional Score: 7 / 80 = 8.8 %  02/01/24: Lower Extremity Functional Score: 17 / 80 = 21.3 %  COGNITION: Overall cognitive status: Within functional limits for tasks assessed     SENSATION: Hot/Cold: Raynauds  EDEMA:  Fingers are swollen  MUSCLE LENGTH: Hamstrings: Right  deg; Left  deg tight bilaterally   POSTURE: rounded shoulders and forward head  PALPATION: General tenderness  Noted tightness lumbar flexion and shoulder flexion  LOWER EXTREMITY ROM:  Active ROM Right eval Left eval  Hip flexion    Hip extension    Hip abduction    Hip adduction    Hip internal rotation    Hip external rotation    Knee flexion    Knee extension    Ankle dorsiflexion    Ankle plantarflexion    Ankle inversion    Ankle eversion     (Blank rows = not tested)  LOWER EXTREMITY MMT:  MMT Right eval Left eval Right 12/20/23 Left 12/20/23 Right 02/01/24  Left 02/01/24   Hip flexion 4+ 4   3+ * 3+ *  Hip extension 3 3 3+ 3+ 3+ * 3+ *  Hip abduction 3- 3- 3+ 3+ 3+ * 3+ *  Hip adduction        Hip internal rotation        Hip external rotation        Knee flexion        Knee extension 5 5      Ankle dorsiflexion 5 5      Ankle plantarflexion        Ankle inversion        Ankle eversion         (Blank rows = not tested)       *=painful   FUNCTIONAL TESTS:  Evaluation: 5 times sit to stand: 27.34 sec using hands to push up Timed up and go (TUG):    17  on  12/06/23              SLS right 30 and left 6  12/13/23: Functional testing:  5X STS: 19.38  LEFS:  18/80=20%  SLS:  both >40 without UE assist 12/20/23: TUG: 13 seconds    02/01/24:  5xSTS: 19 seconds SLS: L-30   R-30 TUG: 14 seconds   GAIT: Distance walked: 50 ft in clinic Assistive device utilized: None Level of assistance: Modified independence Comments: slow gait speed  TREATMENT DATE:  02/01/24 Progress Note/Re-eval LEFS 5xSTS TUG LE MMT Ther Act:  -STS + shoulder flexion - BUE lift holding physioball, 10x -standing in // bars, shoulder horizontal adduction + trunk twist, tossing scarf from R/L hands, 10x Discussion on scheduling follow up with MDs   01/18/24: Treadmill: 1.0 mph for 2 min, 1.3 mph for 2 min  Deep squats to pt max, aiming for flat things on floor, 10x, pt unable to reach floor level due to tightness Hamstring Stretch, 45x2 QL stretch, doorway , 45x1,  side-lying, QL/TFL 45x1 AAROM Shoulder flexion w/ physioball, 5x  10x w/ towel   12/27/23:  Recumbent bike, seat 9, 5' Step up/overs, 6 inch step, 10x ascending with R+ descending with L  10x ascending with L+ descending with R Lateral step overs, 6 inch step, 10x over and back Backwards lunges, feet on 2 inch step, 2x10 Piriformis stretch, 2x30 bilat Side-lying TFL stretch, 1' each side   PATIENT EDUCATION:  Education details: PT evaluation, objective findings, POC, Importance of HEP, Precautions, Clinic policies  Person educated: Patient Education method: Explanation and Demonstration Education comprehension: verbalized understanding and returned demonstration  HOME EXERCISE PROGRAM: Access Code: [email or web address] URL: [email or web address] Date: 10/30/2023 Prepared by: [facility] - Rehab  Exercises - Sit to Stand  - 2 x daily - 7 x weekly - 1 sets - 10 reps - standing single leg balance at the counter (try to not hold on)  - 2 x daily - 7 x weekly - 1 sets - 5 reps - 30 sec hold - Seated 3 Way Exercise [employer] Out Stretch  - 2 x daily - 7 x weekly - 1 sets - 10 reps - Seated Hamstring Stretch  - 2 x daily - 7 x weekly - 1 sets - 5 reps - 20 sec hold  Date: 11/22/2023 - Standing Hip Abduction with Anterior Support  - 2 x daily - 7 x weekly - 2 sets - 10 reps - Standing Hip Extension with Chair  - 2 x daily - 7 x weekly  - 3 sets - 10 reps - Standing Heel Raise with Support  - 2 x daily - 7 x weekly - 3 sets - 10 reps   Access Code: [email or web address] URL: [email or web address] Date: 12/27/2023 Prepared by: Rosaria Powell-Butler  Exercises - Sidelying TFL Stretch  - 2 x daily - 7 x weekly - 3 sets - 1 min hold - Supine Piriformis Stretch with Foot on Ground  - 2 x daily - 7 x weekly - 3 sets - 30 hold  Access Code: [email or web address] URL: [email or web address] Date: 01/18/2024 Prepared by: Rosaria Powell-Butler  Exercises - Standing Quadratus Lumborum Stretch with Doorway  - 2 x daily - 7 x weekly - 3 sets - 45 hold - Standing Single Arm Shoulder Flexion Stretch on Wall  - 2 x daily - 7 x weekly - 3 sets - 10 reps - 10 hold  ASSESSMENT:  CLINICAL IMPRESSION: Reassessment performed this date. Patient is greatly limited this date due to increased pain throughout her body. Patient reports she feels like her whole body feels like an entire pulled muscle. When palpating wrist extensors, moderate/severe tightness can be felt. Patient very painful during transfers, while performing bed mobility, and during all LE MMT. Improvements noted with balance this date. Encouraged patient to reach out to primary MD and/or rheumatologist for follow up as pain seems to be increasing and hindering her functionally more recently. Remainder of session spent focusing on full body mobility,  with pt continuing to be limited due to tightness. Patient will benefit from continued skilled physical therapy services to address unmet goals and her remaining deficits to improve function/QOL.     EVAL: Patient is a 53 y.o. female who was seen today for physical therapy evaluation and treatment for frequent falls.  Patient demonstrates decreased strength, balance deficits, ROM limitations and gait abnormalities which are negatively impacting patient ability to perform ADLs and functional mobility tasks. Patient will benefit from  skilled physical therapy services to address these deficits to improve level of function with ADLs, functional mobility tasks, and reduce risk for falls.    OBJECTIVE IMPAIRMENTS: Abnormal gait, decreased balance, decreased mobility, difficulty walking, decreased ROM, decreased strength, hypomobility, increased fascial restrictions, impaired perceived functional ability, impaired sensation, and pain.   ACTIVITY LIMITATIONS: carrying, lifting, bending, sitting, standing, squatting, sleeping, stairs, transfers, bed mobility, bathing, toileting, dressing, reach over head, hygiene/grooming, locomotion level, and caring for others  PARTICIPATION LIMITATIONS: meal prep, cleaning, laundry, driving, shopping, community activity, occupation, and yard work  PERSONAL FACTORS: 1-2 comorbidities: scleroderma, Raynauds are also affecting patient's functional outcome.   REHAB POTENTIAL: Good  CLINICAL DECISION MAKING: Evolving/moderate complexity  EVALUATION COMPLEXITY: Moderate   GOALS: Goals reviewed with patient? No  SHORT TERM GOALS: Target date: 03/11/2024 Patient will be independent with performance of HEP to demonstrate adequate self management of symptoms.  Baseline:  Goal status: IN PROGRESS (has to make herself do it)  2.   Patient will report at least a 25% improvement with function or pain overall since beginning PT. Baseline:  Goal status: IN PROGRESS   LONG TERM GOALS: Target date: 03/11/2024  Patient will be independent in self management strategies to improve quality of life and functional outcomes.   Baseline:  Goal status: IN PROGRESS  2.  Patient will report 50% improvement overall  Baseline:  Goal status: IN PROGRESS  3.  Patient will be able to stand SLS x 40 each leg to demonstrate improved functional balance.   Baseline: right 30 and left 6 Goal status: MET  4.  Patient will improve LEFS score by 20 points to demonstrate improved perceived  function  Baseline: 8/80 Goal status: MET  5.  Patient will improve 5 times sit to stand score to 20 sec or less to demonstrate improved functional mobility and increased leg strength.     Baseline: 27.34 sec; 6/18: 19.38 sec Goal status: MET    PLAN:  PT FREQUENCY: 1-2x/week  PT DURATION: 6 weeks  PLANNED INTERVENTIONS: 97164- PT Re-evaluation, 97110-Therapeutic exercises, 97530- Therapeutic activity, 97112- Neuromuscular re-education, 97535- Self Care, [id number]- Manual therapy, [phone]- Gait training, Patient/Family education, Balance training, Stair training, Dry Needling, Joint mobilization, Spinal mobilization, Vestibular training, and Moist heat  PLAN FOR NEXT SESSION: Progress general spinal and lower extremity mobility and strengthening, balance, functional strength, requested more visits on 02/01/24 (1x/4wk)    3:07 PM, 02/01/24 Kileigh Ortmann Powell-Butler, PT, DPT [facility] [place]    Managed Medicaid Authorization Request   Visit Dx Codes:  R29.6 R26.2 R26.89   Functional Tool Score: Lower Extremity Functional Score: 17 / 80 = 21.3 %   For all possible CPT codes, reference the Planned Interventions line above.                        Check all conditions that are expected to impact treatment: {Conditions expected to impact treatment:None of these apply  If treatment provided at initial evaluation, no treatment charged due to lack of authorization.

## 2024-02-01 NOTE — Telephone Encounter (Signed)
 Pt would like a call to know if there was a referral for her to get infusions. Pt stated she talked about this at her last visit.

## 2024-02-09 NOTE — Telephone Encounter (Signed)
 Patients mom called the office to follow up on a referral for a Physical Therapy to [facility] and as well as some type of infusions. Please advise

## 2024-02-13 ENCOUNTER — Other Ambulatory Visit: Payer: Self-pay | Admitting: *Deleted

## 2024-02-13 ENCOUNTER — Encounter: Payer: Self-pay | Admitting: Internal Medicine

## 2024-02-13 DIAGNOSIS — M25552 Pain in left hip: Secondary | ICD-10-CM

## 2024-02-13 NOTE — Telephone Encounter (Signed)
 After review of labs from last appointment and discussion with patient, I am actually recommending tocilizumab as a treatment for seronegative rheumatoid arthritis overlap with scleroderma. I sent her drug information to review since it is a change from our discussed plan in clinic.  Actemra is more preferred based on most recent EULAR guidelines and particularly with seronegative disease. She would strongly prefer infusions due to discomfort injecting herself and has considerable restriction in her hands. Would start at 4 mg/kg monthly.

## 2024-02-13 NOTE — Telephone Encounter (Signed)
 Patient's mother contacted the office again regarding they physical therapy referral to [facility] and [facility] infusions. Please advise.

## 2024-02-14 ENCOUNTER — Other Ambulatory Visit: Payer: Self-pay | Admitting: Pharmacist

## 2024-02-14 DIAGNOSIS — M349 Systemic sclerosis, unspecified: Secondary | ICD-10-CM

## 2024-02-14 DIAGNOSIS — Z79899 Other long term (current) drug therapy: Secondary | ICD-10-CM

## 2024-02-14 DIAGNOSIS — Z111 Encounter for screening for respiratory tuberculosis: Secondary | ICD-10-CM

## 2024-02-14 NOTE — Progress Notes (Deleted)
 Therapy plan placed for Actemra IV ([id number]) at [facility] [phone]) [place] to start benefits investigation. She is a new strat  Diagnosis: seronegative RA + systemic sclerosis  Provider: Dr. Lonni Ester  Dose: 4mg /kg every 28 days Premedications: acetaminophen  650mg  p.o. and diphenhydramine  25mg  p.o.  Last Clinic Visit: 12/22/2023 Next Clinic Visit: 04/12/2024  Pertinent Labs: CBC and CMP on 12/22/2023;  Hepatitis panel negative on 05/23/2022 TB gold - pending  Sherry Pennant, PharmD, MPH, BCPS, CPP Clinical Pharmacist (Rheumatology and Pulmonology)

## 2024-02-14 NOTE — Telephone Encounter (Signed)
 Spoke with patient - she will have TB gold drawn tomorrow at [facility]. Order placed

## 2024-02-15 DIAGNOSIS — M0600 Rheumatoid arthritis without rheumatoid factor, unspecified site: Secondary | ICD-10-CM | POA: Insufficient documentation

## 2024-02-19 ENCOUNTER — Ambulatory Visit (HOSPITAL_COMMUNITY)

## 2024-02-19 ENCOUNTER — Encounter (HOSPITAL_COMMUNITY): Payer: Self-pay

## 2024-02-19 DIAGNOSIS — R296 Repeated falls: Secondary | ICD-10-CM

## 2024-02-19 DIAGNOSIS — R29898 Other symptoms and signs involving the musculoskeletal system: Secondary | ICD-10-CM

## 2024-02-19 DIAGNOSIS — R2689 Other abnormalities of gait and mobility: Secondary | ICD-10-CM

## 2024-02-19 DIAGNOSIS — R262 Difficulty in walking, not elsewhere classified: Secondary | ICD-10-CM

## 2024-02-19 NOTE — Therapy (Signed)
 OUTPATIENT PHYSICAL THERAPY LOWER EXTREMITY TREATMENT   Patient Name: Erin Higgins MRN: [id number] DOB:[birth year], 53 y.o., female Today's Date: 02/20/2024  END OF SESSION:  PT End of Session - 02/19/24 0945     Visit Number 9    Number of Visits 18    Date for PT Re-Evaluation 02/29/24    Authorization Type [employer]; medicaid prepaid health    Authorization Time Period cheron complete approved 10 visits from 02/03/24-03/11/24    Authorization - Visit Number 1    Authorization - Number of Visits 10    PT Start Time [phone]   pt late arrival   PT Stop Time 1017    PT Time Calculation (min) 31 min    Activity Tolerance Patient tolerated treatment well    Behavior During Therapy [facility] for tasks assessed/performed           Past Medical History:  Diagnosis Date   Diverticulitis    Heartburn    Herpes simplex of female genitalia    Scleredema (HCC)    Tapeworm 04/2023   Past Surgical History:  Procedure Laterality Date   COLONOSCOPY     DILATION AND CURETTAGE OF UTERUS     TOOTH EXTRACTION  08/2023   TUBAL LIGATION  05/03/2012   Procedure: POST PARTUM TUBAL LIGATION;  Surgeon: Elveria Mungo, MD;  Location: [facility];  Service: Gynecology;  Laterality: Bilateral;  with filshie clips   Patient Active Problem List   Diagnosis Date Noted   Seronegative rheumatoid arthritis (HCC) 02/15/2024   Vision changes 06/22/2023   Herpes simplex of female genitalia    Lateral pain of left hip 03/01/2023   Unintentional weight loss of 10% body weight within 6 months 03/01/2023   Systemic sclerosis (HCC) 01/18/2023   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   Vitamin D deficiency 12/19/2022   Rash and other nonspecific skin eruption 12/19/2022   Chest pain of uncertain etiology 08/08/2022   Raynaud phenomenon 08/08/2022   Encounter for sterilization 05/03/2012    PCP: Shona Norleen PEDLAR, MD  REFERRING PROVIDER: Joeann Browning, FNP  REFERRING DIAG: Frequent falls  THERAPY DIAG:   Repeated falls  Difficulty in walking, not elsewhere classified  Other abnormalities of gait and mobility  Other symptoms and signs involving the musculoskeletal system  Rationale for Evaluation and Treatment: Rehabilitation  ONSET DATE: 04/2023  SUBJECTIVE:   SUBJECTIVE STATEMENT: Pt reports no falls since last visit but reports she has been in the bed a lot. Her upper has been been bothering her. Today she reports it as a 8/10. Reports it as tightness. Reports she and her mom followed up with MD after last visit. Says they want her to get a fusion for her joints and some other form of therapy. It's in [place], thinks its called [facility], and it may be laser for her skin.    EVAL:Started having some falls in the winter of 2024 into 2025; latest fall was yesterday in the house; feet sometimes tingle and are numb from the Raynaud's.Sister had to assist her up. She reports just really stiff; has a hard time bending down to pick things up; one of her falls was with trying to pick something up.  Feels she is stiff and weak in general  PERTINENT HISTORY: Scleroderma; taking methotrexate  for not quite a year Raynauds Here previously with OT; hands still bother her   PAIN:  Are you having pain? Yes: NPRS scale: 6/10 Pain location: all over Pain description: tight and stiff and skin is pulling  Aggravating factors: scleroderma Relieving factors: rest  PRECAUTIONS: Fall  RED FLAGS: None   WEIGHT BEARING RESTRICTIONS: No  FALLS:  Has patient fallen in last 6 months? Yes. 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  DIAGNOSTIC FINDINGS:   PATIENT SURVEYS:  LEFS evaluation:  12/13/23:  20% (at evaluation:  8/80 10%)  12/20/23: Lower Extremity Functional Score: 7 / 80 = 8.8 %  02/01/24: Lower Extremity Functional Score: 17 / 80 = 21.3 %  COGNITION: Overall cognitive status: Within functional limits for tasks assessed     SENSATION: Hot/Cold: Raynauds  EDEMA:  Fingers are swollen  MUSCLE LENGTH: Hamstrings: Right  deg; Left  deg tight bilaterally   POSTURE: rounded shoulders and forward head  PALPATION: General tenderness  Noted tightness lumbar flexion and shoulder flexion  LOWER EXTREMITY ROM:  Active ROM Right eval Left eval  Hip flexion    Hip extension    Hip abduction    Hip adduction    Hip internal rotation    Hip external rotation    Knee flexion    Knee extension    Ankle dorsiflexion    Ankle plantarflexion    Ankle inversion    Ankle eversion     (Blank rows = not tested)  LOWER EXTREMITY MMT:  MMT Right eval Left eval Right 12/20/23 Left 12/20/23 Right 02/01/24  Left 02/01/24   Hip flexion 4+ 4   3+ * 3+ *  Hip extension 3 3 3+ 3+ 3+ * 3+ *  Hip abduction 3- 3- 3+ 3+ 3+ * 3+ *  Hip adduction        Hip internal rotation        Hip external rotation        Knee flexion        Knee extension 5 5      Ankle dorsiflexion 5 5      Ankle plantarflexion        Ankle inversion        Ankle eversion         (Blank rows = not tested)       *=painful   FUNCTIONAL TESTS:  Evaluation: 5 times sit to stand: 27.34 sec using hands to push up Timed up and go (TUG):    17  on  12/06/23              SLS right 30 and left 6  12/13/23: Functional testing:  5X STS: 19.38  LEFS:  18/80=20%  SLS:  both >40 without UE assist 12/20/23: TUG: 13  seconds    02/01/24:  5xSTS: 19 seconds SLS: L-30   R-30 TUG: 14 seconds   GAIT: Distance walked: 50 ft in clinic Assistive device utilized: None Level of assistance: Modified independence Comments: slow gait speed  TREATMENT DATE:  02/19/24: Shoulder Rows, RTB, 2x12 Shoulder extension, RTB, 2x12 AAROM, shoulder flexion with towel, cues for core engagement to limit lumbar ext, 12x [place] against the wall, pt standing, w/ towel, 10x each side  Open Books, 10x each side, PT blocking at knees PROM, shoulder flexion, abduction and elbow extension, to pt tolerance, 5 reps, 5 holds of each, pt supine     02/01/24 Progress Note/Re-eval LEFS 5xSTS TUG LE MMT Ther Act:  -STS + shoulder flexion - BUE lift holding physioball, 10x -standing in // bars, shoulder horizontal adduction + trunk twist, tossing scarf from R/L hands, 10x Discussion on scheduling follow up with MDs   01/18/24: Treadmill: 1.0 mph for 2 min, 1.3 mph for 2 min  Deep squats to pt max, aiming for flat things on floor, 10x, pt unable to reach floor level due to tightness Hamstring Stretch, 45x2 QL stretch, doorway , 45x1,  side-lying, QL/TFL 45x1 AAROM Shoulder flexion w/ physioball, 5x  10x w/ towel  PATIENT EDUCATION:  Education details: PT evaluation, objective findings, POC, Importance of HEP, Precautions, Clinic policies  Person educated: Patient Education method: Explanation and Demonstration Education comprehension: verbalized understanding and returned demonstration  HOME EXERCISE PROGRAM: Access Code: [email or web address] URL: [email or web address] Date: 10/30/2023 Prepared by: [facility] - Rehab  Exercises - Sit to Stand  - 2 x daily - 7 x weekly - 1 sets - 10 reps - standing single leg balance at the counter (try to not hold on)  - 2 x daily - 7 x weekly - 1  sets - 5 reps - 30 sec hold - Seated 3 Way Exercise [employer] Out Stretch  - 2 x daily - 7 x weekly - 1 sets - 10 reps - Seated Hamstring Stretch  - 2 x daily - 7 x weekly - 1 sets - 5 reps - 20 sec hold  Date: 11/22/2023 - Standing Hip Abduction with Anterior Support  - 2 x daily - 7 x weekly - 2 sets - 10 reps - Standing Hip Extension with Chair  - 2 x daily - 7 x weekly - 3 sets - 10 reps - Standing Heel Raise with Support  - 2 x daily - 7 x weekly - 3 sets - 10 reps   Access Code: [email or web address] URL: [email or web address] Date: 12/27/2023 Prepared by: Rosaria Powell-Butler  Exercises - Sidelying TFL Stretch  - 2 x daily - 7 x weekly - 3 sets - 1 min hold - Supine Piriformis Stretch with Foot on Ground  - 2 x daily - 7 x weekly - 3 sets - 30 hold  Access Code: [email or web address] URL: [email or web address] Date: 01/18/2024 Prepared by: Rosaria Powell-Butler  Exercises - Standing Quadratus Lumborum Stretch with Doorway  - 2 x daily - 7 x weekly - 3 sets - 45 hold - Standing Single Arm Shoulder Flexion Stretch on Wall  - 2 x daily - 7 x weekly - 3 sets - 10 reps - 10 hold  ASSESSMENT:  CLINICAL IMPRESSION: Patient reports to PT session in increased pain this date in upper body. Session focused on upper extremity and trunk mobility. Pt demo need for verbal cueing throughout for core engagement as she increased lumbar extension in order to increase UE movement. Attempted to perform mobility in quadruped position but too painful to patient hands. Ended session with PROM. Patient reports decreased pain at end of session. Patient will benefit from continued skilled physical therapy services to address unmet goals and her remaining deficits  to improve function/QOL.    EVAL: Patient is a 53 y.o. female who was seen today for physical therapy evaluation and treatment for frequent falls.  Patient demonstrates decreased strength, balance deficits, ROM limitations and gait  abnormalities which are negatively impacting patient ability to perform ADLs and functional mobility tasks. Patient will benefit from skilled physical therapy services to address these deficits to improve level of function with ADLs, functional mobility tasks, and reduce risk for falls.    OBJECTIVE IMPAIRMENTS: Abnormal gait, decreased balance, decreased mobility, difficulty walking, decreased ROM, decreased strength, hypomobility, increased fascial restrictions, impaired perceived functional ability, impaired sensation, and pain.   ACTIVITY LIMITATIONS: carrying, lifting, bending, sitting, standing, squatting, sleeping, stairs, transfers, bed mobility, bathing, toileting, dressing, reach over head, hygiene/grooming, locomotion level, and caring for others  PARTICIPATION LIMITATIONS: meal prep, cleaning, laundry, driving, shopping, community activity, occupation, and yard work  PERSONAL FACTORS: 1-2 comorbidities: scleroderma, Raynauds are also affecting patient's functional outcome.   REHAB POTENTIAL: Good  CLINICAL DECISION MAKING: Evolving/moderate complexity  EVALUATION COMPLEXITY: Moderate   GOALS: Goals reviewed with patient? No  SHORT TERM GOALS: Target date: 03/11/2024 Patient will be independent with performance of HEP to demonstrate adequate self management of symptoms.  Baseline:  Goal status: IN PROGRESS (has to make herself do it)  2.   Patient will report at least a 25% improvement with function or pain overall since beginning PT. Baseline:  Goal status: IN PROGRESS   LONG TERM GOALS: Target date: 03/11/2024  Patient will be independent in self management strategies to improve quality of life and functional outcomes.   Baseline:  Goal status: IN PROGRESS  2.  Patient will report 50% improvement overall  Baseline:  Goal status: IN PROGRESS  3.  Patient will be able to stand SLS x 40 each leg to demonstrate improved functional balance.   Baseline: right 30  and left 6 Goal status: MET  4.  Patient will improve LEFS score by 20 points to demonstrate improved perceived function  Baseline: 8/80 Goal status: MET  5.  Patient will improve 5 times sit to stand score to 20 sec or less to demonstrate improved functional mobility and increased leg strength.     Baseline: 27.34 sec; 6/18: 19.38 sec Goal status: MET    PLAN:  PT FREQUENCY: 1-2x/week  PT DURATION: 6 weeks  PLANNED INTERVENTIONS: 97164- PT Re-evaluation, 97110-Therapeutic exercises, 97530- Therapeutic activity, 97112- Neuromuscular re-education, 97535- Self Care, [id number]- Manual therapy, [phone]- Gait training, Patient/Family education, Balance training, Stair training, Dry Needling, Joint mobilization, Spinal mobilization, Vestibular training, and Moist heat  PLAN FOR NEXT SESSION: Progress general spinal and lower extremity mobility and strengthening, balance, functional strength    9:21 AM, 02/20/24 Rosaria Settler, PT, DPT [facility] [place]

## 2024-02-21 ENCOUNTER — Encounter: Payer: Self-pay | Admitting: Pharmacist

## 2024-02-27 ENCOUNTER — Ambulatory Visit (HOSPITAL_COMMUNITY)

## 2024-02-27 ENCOUNTER — Encounter (HOSPITAL_COMMUNITY)

## 2024-03-01 ENCOUNTER — Encounter (HOSPITAL_COMMUNITY): Payer: Self-pay

## 2024-03-01 ENCOUNTER — Ambulatory Visit (HOSPITAL_COMMUNITY): Attending: Internal Medicine

## 2024-03-01 DIAGNOSIS — R296 Repeated falls: Secondary | ICD-10-CM | POA: Insufficient documentation

## 2024-03-01 DIAGNOSIS — R262 Difficulty in walking, not elsewhere classified: Secondary | ICD-10-CM | POA: Diagnosis present

## 2024-03-01 DIAGNOSIS — R2689 Other abnormalities of gait and mobility: Secondary | ICD-10-CM | POA: Diagnosis present
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
## 2024-03-04 ENCOUNTER — Telehealth: Payer: Self-pay | Admitting: Internal Medicine

## 2024-03-04 ENCOUNTER — Ambulatory Visit (HOSPITAL_COMMUNITY)

## 2024-03-04 NOTE — Telephone Encounter (Signed)
Patient states that she is returning a call . Please advise

## 2024-03-04 NOTE — Telephone Encounter (Signed)
 Detailed message left on voice mail - no one from [facility] or [facility] tried to call patient - suggest she may try outpatient rehab as looks like she has been seen there most recently.

## 2024-03-11 ENCOUNTER — Encounter (HOSPITAL_COMMUNITY)
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
## 2024-03-16 ENCOUNTER — Inpatient Hospital Stay (HOSPITAL_COMMUNITY)
Admission: EM | Admit: 2024-03-16 | Discharge: 2024-03-21 | DRG: 287 | Disposition: A | Discharge status: Home / Self Care | Attending: Internal Medicine | Admitting: Internal Medicine

## 2024-03-16 ENCOUNTER — Inpatient Hospital Stay (HOSPITAL_COMMUNITY)

## 2024-03-16 ENCOUNTER — Emergency Department (HOSPITAL_COMMUNITY)

## 2024-03-16 ENCOUNTER — Encounter (HOSPITAL_COMMUNITY): Payer: Self-pay | Admitting: Emergency Medicine

## 2024-03-16 ENCOUNTER — Other Ambulatory Visit: Payer: Self-pay

## 2024-03-16 DIAGNOSIS — E876 Hypokalemia: Secondary | ICD-10-CM | POA: Diagnosis present

## 2024-03-16 DIAGNOSIS — I3139 Other pericardial effusion (noninflammatory): Principal | ICD-10-CM | POA: Diagnosis present

## 2024-03-16 DIAGNOSIS — M0600 Rheumatoid arthritis without rheumatoid factor, unspecified site: Secondary | ICD-10-CM | POA: Diagnosis present

## 2024-03-16 DIAGNOSIS — I2722 Pulmonary hypertension due to left heart disease: Secondary | ICD-10-CM | POA: Diagnosis present

## 2024-03-16 DIAGNOSIS — J40 Bronchitis, not specified as acute or chronic: Secondary | ICD-10-CM | POA: Diagnosis present

## 2024-03-16 DIAGNOSIS — Z8249 Family history of ischemic heart disease and other diseases of the circulatory system: Secondary | ICD-10-CM | POA: Diagnosis not present

## 2024-03-16 DIAGNOSIS — M349 Systemic sclerosis, unspecified: Secondary | ICD-10-CM | POA: Diagnosis present

## 2024-03-16 DIAGNOSIS — J209 Acute bronchitis, unspecified: Secondary | ICD-10-CM | POA: Diagnosis present

## 2024-03-16 DIAGNOSIS — Z833 Family history of diabetes mellitus: Secondary | ICD-10-CM

## 2024-03-16 DIAGNOSIS — Z803 Family history of malignant neoplasm of breast: Secondary | ICD-10-CM | POA: Diagnosis not present

## 2024-03-16 DIAGNOSIS — Q676 Pectus excavatum: Secondary | ICD-10-CM

## 2024-03-16 DIAGNOSIS — Z79631 Long term (current) use of antimetabolite agent: Secondary | ICD-10-CM

## 2024-03-16 DIAGNOSIS — Z8269 Family history of other diseases of the musculoskeletal system and connective tissue: Secondary | ICD-10-CM | POA: Diagnosis not present

## 2024-03-16 DIAGNOSIS — I7300 Raynaud's syndrome without gangrene: Secondary | ICD-10-CM | POA: Diagnosis present

## 2024-03-16 DIAGNOSIS — R531 Weakness: Secondary | ICD-10-CM | POA: Diagnosis not present

## 2024-03-16 DIAGNOSIS — D649 Anemia, unspecified: Secondary | ICD-10-CM | POA: Diagnosis present

## 2024-03-16 DIAGNOSIS — I270 Primary pulmonary hypertension: Secondary | ICD-10-CM | POA: Diagnosis not present

## 2024-03-16 DIAGNOSIS — R002 Palpitations: Secondary | ICD-10-CM | POA: Diagnosis present

## 2024-03-16 DIAGNOSIS — Z840 Family history of diseases of the skin and subcutaneous tissue: Secondary | ICD-10-CM

## 2024-03-16 DIAGNOSIS — Z83438 Family history of other disorder of lipoprotein metabolism and other lipidemia: Secondary | ICD-10-CM

## 2024-03-16 DIAGNOSIS — Z79899 Other long term (current) drug therapy: Secondary | ICD-10-CM

## 2024-03-16 DIAGNOSIS — Z880 Allergy status to penicillin: Secondary | ICD-10-CM

## 2024-03-16 DIAGNOSIS — Z8379 Family history of other diseases of the digestive system: Secondary | ICD-10-CM

## 2024-03-16 DIAGNOSIS — E559 Vitamin D deficiency, unspecified: Secondary | ICD-10-CM | POA: Diagnosis present

## 2024-03-16 DIAGNOSIS — I10 Essential (primary) hypertension: Secondary | ICD-10-CM | POA: Diagnosis present

## 2024-03-16 DIAGNOSIS — L943 Sclerodactyly: Secondary | ICD-10-CM | POA: Diagnosis present

## 2024-03-16 DIAGNOSIS — Z743 Need for continuous supervision: Secondary | ICD-10-CM | POA: Diagnosis not present

## 2024-03-16 LAB — ECHOCARDIOGRAM COMPLETE
Area-P 1/2: 4.06 cm2
Calc EF: 60 %
S' Lateral: 2.83 cm
Single Plane A2C EF: 68.1 %
Single Plane A4C EF: 49 %

## 2024-03-16 LAB — CBC WITH DIFFERENTIAL/PLATELET
Abs Immature Granulocytes: 0.02 K/uL (ref 0.00–0.07)
Basophils Absolute: 0 K/uL (ref 0.0–0.1)
Basophils Relative: 0 %
Eosinophils Absolute: 0 K/uL (ref 0.0–0.5)
Eosinophils Relative: 1 %
HCT: 31.5 % — ABNORMAL LOW (ref 36.0–46.0)
Hemoglobin: 9.8 g/dL — ABNORMAL LOW (ref 12.0–15.0)
Immature Granulocytes: 0 %
Lymphocytes Relative: 38 %
Lymphs Abs: 1.8 K/uL (ref 0.7–4.0)
MCH: 28.2 pg (ref 26.0–34.0)
MCHC: 31.1 g/dL (ref 30.0–36.0)
MCV: 90.5 fL (ref 80.0–100.0)
Monocytes Absolute: 0.5 K/uL (ref 0.1–1.0)
Monocytes Relative: 10 %
Neutro Abs: 2.4 K/uL (ref 1.7–7.7)
Neutrophils Relative %: 51 %
Platelets: 252 K/uL (ref 150–400)
RBC: 3.48 MIL/uL — ABNORMAL LOW (ref 3.87–5.11)
RDW: 16.1 % — ABNORMAL HIGH (ref 11.5–15.5)
WBC: 4.8 K/uL (ref 4.0–10.5)
nRBC: 0 % (ref 0.0–0.2)

## 2024-03-16 LAB — COMPREHENSIVE METABOLIC PANEL WITH GFR
ALT: 12 U/L (ref 0–44)
AST: 23 U/L (ref 15–41)
Albumin: 3 g/dL — ABNORMAL LOW (ref 3.5–5.0)
Alkaline Phosphatase: 46 U/L (ref 38–126)
Anion gap: 7 (ref 5–15)
BUN: 13 mg/dL (ref 6–20)
CO2: 23 mmol/L (ref 22–32)
Calcium: 8.3 mg/dL — ABNORMAL LOW (ref 8.9–10.3)
Chloride: 110 mmol/L (ref 98–111)
Creatinine, Ser: 0.4 mg/dL — ABNORMAL LOW (ref 0.44–1.00)
GFR, Estimated: >60 mL/min (ref >60)
Glucose, Bld: 97 mg/dL (ref 70–99)
Potassium: 3.2 mmol/L — ABNORMAL LOW (ref 3.5–5.1)
Sodium: 140 mmol/L (ref 135–145)
Total Bilirubin: 0.8 mg/dL (ref 0.0–1.2)
Total Protein: 6.8 g/dL (ref 6.5–8.1)

## 2024-03-16 LAB — LIPASE, BLOOD: Lipase: 34 U/L (ref 11–51)

## 2024-03-16 LAB — C-REACTIVE PROTEIN: CRP: 0.7 mg/dL (ref <1.0)

## 2024-03-16 LAB — PROCALCITONIN: Procalcitonin: <0.1 ng/mL

## 2024-03-16 LAB — TROPONIN I (HIGH SENSITIVITY)
Troponin I (High Sensitivity): 56 ng/L — ABNORMAL HIGH (ref <18)
Troponin I (High Sensitivity): 56 ng/L — ABNORMAL HIGH (ref <18)

## 2024-03-16 LAB — PROTIME-INR
INR: 1.3 — ABNORMAL HIGH (ref 0.8–1.2)
Prothrombin Time: 16.7 s — ABNORMAL HIGH (ref 11.4–15.2)

## 2024-03-16 LAB — PHOSPHORUS: Phosphorus: 2.4 mg/dL — ABNORMAL LOW (ref 2.5–4.6)

## 2024-03-16 LAB — MAGNESIUM: Magnesium: 1.7 mg/dL (ref 1.7–2.4)

## 2024-03-16 LAB — HIV ANTIBODY (ROUTINE TESTING W REFLEX): HIV Screen 4th Generation wRfx: NONREACTIVE

## 2024-03-16 LAB — HCG, QUANTITATIVE, PREGNANCY: hCG, Beta Chain, Quant, S: 1 m[IU]/mL (ref <5)

## 2024-03-16 LAB — SEDIMENTATION RATE: Sed Rate: 12 mm/h (ref 0–22)

## 2024-03-16 MED ORDER — TRAZODONE HCL 50 MG PO TABS
25.0000 mg | ORAL_TABLET | Freq: Every evening | ORAL | Status: DC | PRN
  Administered 2024-03-17 – 2024-03-20 (×5): 25 mg via ORAL
  Filled 2024-03-16 (×5): qty 1

## 2024-03-16 MED ORDER — SENNOSIDES-DOCUSATE SODIUM 8.6-50 MG PO TABS: 1.0000 | ORAL_TABLET | Freq: Every evening | ORAL | Status: DC | PRN

## 2024-03-16 MED ORDER — SODIUM CHLORIDE 0.9% FLUSH
3.0000 mL | Freq: Two times a day (BID) | INTRAVENOUS | Status: DC
  Administered 2024-03-16 – 2024-03-20 (×10): 3 mL via INTRAVENOUS

## 2024-03-16 MED ORDER — IPRATROPIUM BROMIDE 0.02 % IN SOLN: 0.5000 mg | Freq: Four times a day (QID) | RESPIRATORY_TRACT | Status: DC | PRN

## 2024-03-16 MED ORDER — HYDRALAZINE HCL 20 MG/ML IJ SOLN: 10.0000 mg | INTRAMUSCULAR | Status: DC | PRN

## 2024-03-16 MED ORDER — POTASSIUM CHLORIDE CRYS ER 20 MEQ PO TBCR
40.0000 meq | EXTENDED_RELEASE_TABLET | Freq: Once | ORAL | Status: AC
  Administered 2024-03-16: 40 meq via ORAL
  Filled 2024-03-16: qty 2

## 2024-03-16 MED ORDER — FAMOTIDINE 20 MG PO TABS
20.0000 mg | ORAL_TABLET | Freq: Every day | ORAL | Status: DC
  Administered 2024-03-16 – 2024-03-20 (×5): 20 mg via ORAL
  Filled 2024-03-16 (×5): qty 1

## 2024-03-16 MED ORDER — FENTANYL CITRATE (PF) 100 MCG/2ML IJ SOLN
25.0000 ug | Freq: Once | INTRAMUSCULAR | Status: AC
  Administered 2024-03-16: 25 ug via INTRAVENOUS
  Filled 2024-03-16: qty 2

## 2024-03-16 MED ORDER — BISACODYL 5 MG PO TBEC: 5.0000 mg | DELAYED_RELEASE_TABLET | Freq: Every day | ORAL | Status: DC | PRN

## 2024-03-16 MED ORDER — FLEET ENEMA RE ENEM: 1.0000 | ENEMA | Freq: Once | RECTAL | Status: DC | PRN

## 2024-03-16 MED ORDER — AZITHROMYCIN 250 MG PO TABS
500.0000 mg | ORAL_TABLET | Freq: Every day | ORAL | Status: AC
  Administered 2024-03-17 – 2024-03-21 (×5): 500 mg via ORAL
  Filled 2024-03-16 (×5): qty 2

## 2024-03-16 MED ORDER — SODIUM CHLORIDE 0.9 % IV SOLN
2.0000 g | Freq: Once | INTRAVENOUS | Status: AC
  Administered 2024-03-16: 2 g via INTRAVENOUS
  Filled 2024-03-16: qty 20

## 2024-03-16 MED ORDER — IOHEXOL 350 MG/ML SOLN
75.0000 mL | Freq: Once | INTRAVENOUS | Status: AC | PRN
  Administered 2024-03-16: 75 mL via INTRAVENOUS

## 2024-03-16 MED ORDER — ONDANSETRON HCL 4 MG/2ML IJ SOLN
4.0000 mg | Freq: Four times a day (QID) | INTRAMUSCULAR | Status: DC | PRN
  Administered 2024-03-20: 4 mg via INTRAVENOUS
  Filled 2024-03-16: qty 2

## 2024-03-16 MED ORDER — LEVALBUTEROL HCL 0.63 MG/3ML IN NEBU: 0.6300 mg | INHALATION_SOLUTION | Freq: Four times a day (QID) | RESPIRATORY_TRACT | Status: DC | PRN

## 2024-03-16 MED ORDER — HEPARIN SODIUM (PORCINE) 5000 UNIT/ML IJ SOLN: 5000.0000 [IU] | Freq: Three times a day (TID) | INTRAMUSCULAR | Status: DC

## 2024-03-16 MED ORDER — MAGNESIUM SULFATE 2 GM/50ML IV SOLN
2.0000 g | Freq: Once | INTRAVENOUS | Status: AC
  Administered 2024-03-16: 2 g via INTRAVENOUS
  Filled 2024-03-16: qty 50

## 2024-03-16 MED ORDER — AMLODIPINE BESYLATE 2.5 MG PO TABS
2.5000 mg | ORAL_TABLET | Freq: Every day | ORAL | Status: DC
  Administered 2024-03-16 – 2024-03-21 (×6): 2.5 mg via ORAL
  Filled 2024-03-16 (×6): qty 1

## 2024-03-16 MED ORDER — LORAZEPAM 2 MG/ML IJ SOLN
1.0000 mg | Freq: Once | INTRAMUSCULAR | Status: DC
  Filled 2024-03-16: qty 1

## 2024-03-16 MED ORDER — OXYCODONE HCL 5 MG PO TABS
5.0000 mg | ORAL_TABLET | ORAL | Status: DC | PRN
  Administered 2024-03-17 – 2024-03-18 (×2): 5 mg via ORAL
  Filled 2024-03-16 (×4): qty 1

## 2024-03-16 MED ORDER — ACETAMINOPHEN 325 MG PO TABS
650.0000 mg | ORAL_TABLET | Freq: Four times a day (QID) | ORAL | Status: DC | PRN
  Administered 2024-03-16 – 2024-03-19 (×3): 650 mg via ORAL
  Filled 2024-03-16 (×3): qty 2

## 2024-03-16 MED ORDER — HYDROMORPHONE HCL 1 MG/ML IJ SOLN: 0.5000 mg | INTRAMUSCULAR | Status: DC | PRN

## 2024-03-16 MED ORDER — ONDANSETRON HCL 4 MG PO TABS: 4.0000 mg | ORAL_TABLET | Freq: Four times a day (QID) | ORAL | Status: DC | PRN

## 2024-03-16 MED ORDER — METHOTREXATE SODIUM 2.5 MG PO TABS: 20.0000 mg | ORAL_TABLET | ORAL | Status: DC

## 2024-03-16 MED ORDER — ACETAMINOPHEN 650 MG RE SUPP: 650.0000 mg | Freq: Four times a day (QID) | RECTAL | Status: DC | PRN

## 2024-03-16 MED ORDER — SODIUM CHLORIDE 0.9 % IV SOLN: INTRAVENOUS | Status: AC

## 2024-03-16 MED ORDER — SODIUM CHLORIDE 0.9 % IV SOLN
500.0000 mg | INTRAVENOUS | Status: DC
  Administered 2024-03-16: 500 mg via INTRAVENOUS
  Filled 2024-03-16: qty 5

## 2024-03-16 NOTE — Assessment & Plan Note (Signed)
-   Currently stable denies any chest pain -As needed morphine , nitroglycerin

## 2024-03-16 NOTE — Plan of Care (Signed)

## 2024-03-16 NOTE — Progress Notes (Signed)
  Echocardiogram 2D Echocardiogram has been performed.  Erin Higgins 03/16/2024, 12:12 PM

## 2024-03-16 NOTE — Assessment & Plan Note (Addendum)
 seronegative RA + systemic sclerosis  -Stable -On methotrexate  - S/p treatment with IV Actemra on 02/14/2024 Currently scheduled for 4 mg/kg every 28 days -Close follow-up with Dr. Lonni Ester

## 2024-03-16 NOTE — Assessment & Plan Note (Signed)
-   Serum potassium 3.2, repleted with 40 mEq of Kcl

## 2024-03-16 NOTE — H&P (Signed)
 History and Physical   Patient: Erin Higgins                            PCP: Shona Norleen PEDLAR, MD                    DOB: 05-Feb-1971            DOA: 03/16/2024 FMW:[id number]             DOS: 03/16/2024, 10:28 AM  Shona Norleen PEDLAR, MD  Patient coming from:   HOME  I have personally reviewed patient's medical records, in electronic medical records, including:  [place] [facility], and care everywhere.    Chief Complaint:   Chief Complaint  Patient presents with   Palpitations    History of present illness:    Erin Higgins is a 53 year old female with a history of systemic sclerosis, scleroderma on methotrexate , HTN, vitamin vitamin D deficiency-presented to ED with chief complaint of palpitation, which improved with IV fluid hydration, supplemental oxygen.  Denied any chest pain, shortness of breath, or diaphoresis.  ED evaluation: Blood pressure (!) 117/101, pulse 85, temperature 98.1 F (36.7 C), temperature source Oral, resp. rate 20, SpO2 98%.  LABs: Hemoglobin 9.8, potassium 3.2, calcium 8.3, magnesium  1.7 Troponin 56, 56,  ZXH:Yzjmu rate 94, QTc 351, ventricular bigeminy is no obvious ST elevation or depressio   Chest x-ray mild cardiomegaly no acute changes CT angio: Moderate pericardial effusion, new from 09/19/2022. 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  bronchial thickening consistent with bronchitis or reactive airways disease, left lower lobe opacity ? PNA     Patient Denies having: Fever, Chills, Cough, SOB, Chest Pain, Abd pain, N/V/D, headache, dizziness, lightheadedness,  Dysuria, Joint pain, rash, open wounds     Review of Systems: As per HPI, otherwise 10 point review of systems were negative.   ----------------------------------------------------------------------------------------------------------------------  Allergies  Allergen Reactions   Penicillins Swelling    Has patient had a PCN reaction causing immediate  rash, facial/tongue/throat swelling, SOB or lightheadedness with hypotension: No  Has patient had a PCN reaction causing severe rash involving mucus membranes or skin necrosis: No  Has patient had a PCN reaction that required hospitalization: No  Has patient had a PCN reaction occurring within the last 10 years: No  If all of the above answers are NO, then may proceed with Cephalosporin use.   Latex     Patient state she is not allergic to latex.    Home MEDs:  Prior to Admission medications   Medication Sig Start Date End Date Taking? Authorizing Provider  amLODipine  (NORVASC ) 2.5 MG tablet TAKE 1 TABLET(2.5 MG) BY MOUTH DAILY 10/14/22   Mallipeddi, Vishnu P, MD  B Complex Vitamins (B COMPLEX VITAMIN PO) Take by mouth. liquid    [provider]  famotidine  (PEPCID ) 20 MG tablet Take 1 tablet by mouth daily. 07/25/23   [provider]  liothyronine (CYTOMEL) 5 MCG tablet Take 5 mcg by mouth daily. Patient taking differently: Take 5 mcg by mouth as needed.    [provider]  methotrexate  (RHEUMATREX) 2.5 MG tablet Take 8 tablets (20 mg total) by mouth once a week. 12/22/23   Rice, Lonni ORN, MD  Vitamin D-Vitamin K (VITAMIN K2-VITAMIN D3 PO) Take by mouth. liquid    [provider]    PRN MEDs: acetaminophen  **OR** acetaminophen , bisacodyl , hydrALAZINE , HYDROmorphone  (DILAUDID ) injection, ipratropium, levalbuterol , ondansetron  **  OR** ondansetron  (ZOFRAN ) IV, oxyCODONE , senna-docusate, sodium phosphate, traZODone   Past Medical History:  Diagnosis Date   Diverticulitis    Heartburn    Herpes simplex of female genitalia    Scleredema (HCC)    Tapeworm 04/2023    Past Surgical History:  Procedure Laterality Date   COLONOSCOPY     DILATION AND CURETTAGE OF UTERUS     TOOTH EXTRACTION  08/2023   TUBAL LIGATION  05/03/2012   Procedure: POST PARTUM TUBAL LIGATION;  Surgeon: Elveria Mungo, MD;  Location: [facility];  Service: Gynecology;   Laterality: Bilateral;  with filshie clips     reports that she has never smoked. She has been exposed to tobacco smoke. She has never used smokeless tobacco. She reports that she does not currently use alcohol after a past usage of about 1.0 standard drink of alcohol per week. She reports that she does not use drugs.   Family History  Problem Relation Age of Onset   Hyperlipidemia Mother    Hypertension Mother    Diabetes type II Mother    Sleep apnea Mother    Breast cancer Mother    Hypertension Father    Ulcerative colitis Father    Vitiligo Paternal Great-grandmother    Lupus Paternal Aunt     Physical Exam:   Vitals:   03/16/24 0745 03/16/24 0800 03/16/24 0815 03/16/24 0954  BP: 118/87 116/86 (!) 124/90 (!) 117/101  Pulse: 83 81 88 85  Resp: 14 19 20 20   Temp:      TempSrc:      SpO2: 100% 99% 100% 98%   Constitutional: NAD, calm, comfortable Eyes: PERRL, lids and conjunctivae normal ENMT: Mucous membranes are moist. Posterior pharynx clear of any exudate or lesions.Normal dentition.  Neck: normal, supple, no masses, no thyromegaly Respiratory: clear to auscultation bilaterally, no wheezing, no crackles. Normal respiratory effort. No accessory muscle use.  Cardiovascular: Regular rate and rhythm, no murmurs / rubs / gallops. No extremity edema. 2+ pedal pulses. No carotid bruits.  Abdomen: no tenderness, no masses palpated. No hepatosplenomegaly. Bowel sounds positive.  Musculoskeletal: no clubbing / cyanosis. No joint deformity upper and lower extremities. Good ROM, no contractures. Normal muscle tone.  Neurologic: CN II-XII grossly intact. Sensation intact, DTR normal. Strength 5/5 in all 4.  Psychiatric: Normal judgment and insight. Alert and oriented x 3. Normal mood.  Skin: no rashes, lesions, ulcers. No induration          Labs on admission:    I have personally reviewed following labs and imaging studies  CBC: Recent Labs  Lab 03/16/24 0332  WBC  4.8  NEUTROABS 2.4  HGB 9.8*  HCT 31.5*  MCV 90.5  PLT 252   Basic Metabolic Panel: Recent Labs  Lab 03/16/24 0332  NA 140  K 3.2*  CL 110  CO2 23  GLUCOSE 97  BUN 13  CREATININE 0.40*  CALCIUM 8.3*  MG 1.7   GFR: CrCl cannot be calculated (Unknown ideal weight.). Liver Function Tests: Recent Labs  Lab 03/16/24 0332  AST 23  ALT 12  ALKPHOS 46  BILITOT 0.8  PROT 6.8  ALBUMIN 3.0*   Recent Labs  Lab 03/16/24 0332  LIPASE 34    Urine analysis:    Component Value Date/Time   COLORURINE YELLOW 05/23/2022 1136   APPEARANCEUR CLEAR 05/23/2022 1136   LABSPEC 1.015 05/23/2022 1136   PHURINE 5.0 05/23/2022 1136   GLUCOSEU NEGATIVE 05/23/2022 1136   HGBUR NEGATIVE 05/23/2022 1136  BILIRUBINUR NEGATIVE 05/23/2022 1136   KETONESUR NEGATIVE 05/23/2022 1136   PROTEINUR NEGATIVE 05/23/2022 1136   UROBILINOGEN 2.0 (H) 01/28/2013 1202   NITRITE NEGATIVE 05/23/2022 1136   LEUKOCYTESUR NEGATIVE 05/23/2022 1136    Last A1C:  No results found for: HGBA1C   Radiologic Exams on Admission:   CT Angio Chest PE W and/or Wo Contrast Result Date: 03/16/2024 CLINICAL DATA:  Heart palpitations, awakened the patient from sleep this morning. EXAM: CT ANGIOGRAPHY CHEST WITH CONTRAST TECHNIQUE: Multidetector CT imaging of the chest was performed using the standard protocol during bolus administration of intravenous contrast. Multiplanar CT image reconstructions and MIPs were obtained to evaluate the vascular anatomy. RADIATION DOSE REDUCTION: This exam was performed according to the departmental dose-optimization program which includes automated exposure control, adjustment of the mA and/or kV according to patient size and/or use of iterative reconstruction technique. CONTRAST:  75mL OMNIPAQUE  IOHEXOL  350 MG/ML SOLN COMPARISON:  AP Lat chest today, portable chest 07/12/2022, and cardiac partial chest CT with contrast 09/19/2022. FINDINGS: Cardiovascular: There is pectus excavatum  sternal deformity impressing into the right atrium. Moderate pericardial fluid is seen new from 09/19/2022, maximum thickness 2 cm. The fluid is low in density, 18 Hounsfield units and not suspicious for pericardial hemorrhage. The pulmonary arteries are well opacified, normal caliber and clear through the segmental divisions. The subsegmental arteries are not well seen due to respiratory motion. They're normal where visible. The aorta and great vessels are normal. The pulmonary veins are normal in caliber. There is mild cardiomegaly, seen previously. No visible coronary calcification. Mediastinum/Nodes: No enlarged mediastinal, hilar, or axillary lymph nodes. The lower poles of the thyroid  gland, trachea, and esophagus demonstrate no significant findings. Lungs/Pleura: There is diffuse bronchial thickening. There is increased left lower lobe infrahilar opacity concerning for pneumonia or aspiration. The central airways are clear. Mild chronic elevation right hemidiaphragm. Linear scar-like opacities symmetrically bright apex, outer right upper lobe. Again noted is a 3 mm subpleural right middle lobe solid nodule on 6:56, and 2 adjacent 4 mm right lower lobe superior segment nodules on 6:65, all unchanged. No follow-up imaging is recommended. Benign process given length of stability. The lungs are otherwise clear. Upper Abdomen: No acute abnormality. Musculoskeletal: Slight thoracic levoscoliosis. No acute or significant osseous findings. No chest wall mass is seen. Review of the MIP images confirms the above findings. IMPRESSION: 1. No evidence of arterial dilatation or embolus through the segmental divisions. The subsegmental arteries are not well seen due to respiratory motion. 2. Moderate pericardial effusion, new from 09/19/2022. 3. 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 4. Diffuse bronchial thickening consistent with bronchitis or reactive airways disease. 5. Increased left lower  lobe infrahilar opacity concerning for pneumonia or aspiration. Electronically Signed   By: Francis Quam M.D.   On: 03/16/2024 07:22   DG Chest 2 View Result Date: 03/16/2024 CLINICAL DATA:  Heart palpitations and chest discomfort. EXAM: CHEST - 2 VIEW COMPARISON:  AP chest portable 07/12/2022. FINDINGS: There is mild cardiomegaly. No vascular congestion is seen. No edema or pleural collections. The lungs are clear of infiltrates. The mediastinum is normally outlined. Thoracic cage is intact with slight thoracic levoscoliosis. Multiple superimposed telemetry leads. IMPRESSION: Mild cardiomegaly. No acute chest findings. Electronically Signed   By: Francis Quam M.D.   On: 03/16/2024 03:02    EKG:   Independently reviewed.  Orders placed or performed during the hospital encounter of 03/16/24   EKG 12-Lead   EKG 12-Lead  EKG 12-Lead   EKG 12-Lead   EKG 12-Lead   EKG 12-Lead   EKG 12-Lead   EKG 12-Lead   EKG 12-Lead   ---------------------------------------------------------------------------------------------------------------------------------------    Assessment / Plan:   Principal Problem:   Pericardial effusion Active Problems:   Systemic sclerosis (HCC)   Sclerodactyly   Vitamin D deficiency   Hypokalemia   Hypomagnesemia   Bronchitis   Assessment and Plan: * Pericardial effusion - Admitting to [facility] -EDP Dr. Cleotilde discussed the case with the on-call cardiology Dr. Inocencio in detail. Recommended patient to be admitted to [facility] for further evaluation echocardiogram, and possible drainage of the fluid -Hemodynamically stable -Denies any chest pain or shortness of breath  ZXH:Yzjmu rate 94, QTc 351, ventricular bigeminy is no obvious ST elevation or depression Chest x-ray mild cardiomegaly no acute changes CT angio: Moderate pericardial effusion, new from 09/19/2022. 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  bronchial thickening consistent  with bronchitis or reactive airways disease, left lower lobe opacity ? 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 Lonni Ester  Sclerodactyly Sclerodactyly daily with RA Stable - Continue home medication including methotrexate  q. weekly   Chest pain of uncertain etiology-resolved as of 03/16/2024 - Currently stable denies any chest pain -As needed morphine , nitroglycerin   Vitamin D deficiency - Continue vitamin D supplements  Bronchitis Continue azithromycin  -for presumed bronchitis - In ED received IV azithromycin /Rocephin  for presumed bronchitis ?  Pneumonia -Patient is afebrile, normotensive, with no leukocytosis -Obtaining procalcitonin level  Hypomagnesemia Serum magnesium  1.7 -Replacing with 2 g of magnesium  IV  Hypokalemia - Serum potassium 3.2, repleted with 40 mEq of Kcl         Consults called: Cardiology Dr. Inocencio  -------------------------------------------------------------------------------------------------------------------------------------------- DVT prophylaxis:  Place and maintain sequential compression device Start: 03/16/24 0957 SCDs Start: 03/16/24 0953   Code Status:   Code Status: Full Code   Admission status: Patient will be admitted as Inpatient, with a greater than 2 midnight length of stay. Level of care: Telemetry Cardiac   Family Communication:  none at bedside  (The above findings and plan of care has been discussed with patient in detail, the patient expressed understanding and agreement of above plan)   --------------------------------------------------------------------------------------------------------------------------------------------------  Disposition Plan:  Anticipated 1-2 days Status is: Inpatient Remains inpatient appropriate because: Needing evaluation by cardiology for possible intervention, echocardiogram     ----------------------------------------------------------------------------------------------------------------------------------------------------  Time spent:  29  Min.  Was spent seeing and evaluating the patient, reviewing all medical records, drawn plan of care.  SIGNED: Adriana DELENA Grams, MD, FHM. FAAFP. [facility], Pager  (Please use [email or web address] to page/ or secure chat through epic) If 7PM-7AM, please contact night-coverage [email or web address],  03/16/2024, 10:28 AM

## 2024-03-16 NOTE — ED Notes (Signed)
 Pt provide with a [place] sandwich and soda.

## 2024-03-16 NOTE — Assessment & Plan Note (Addendum)
 Continue azithromycin  -for presumed bronchitis - In ED received IV azithromycin /Rocephin  for presumed bronchitis ?  Pneumonia -Patient is afebrile, normotensive, with no leukocytosis -Obtaining procalcitonin level

## 2024-03-16 NOTE — Assessment & Plan Note (Signed)
 Sclerodactyly daily with RA Stable - Continue home medication including methotrexate  q. weekly

## 2024-03-16 NOTE — Assessment & Plan Note (Addendum)
-   Admitting to [facility] -EDP Dr. Cleotilde discussed the case with the on-call cardiology Dr. Inocencio in detail. Recommended patient to be admitted to [facility] for further evaluation echocardiogram, and possible drainage of the fluid -Hemodynamically stable -Denies any chest pain or shortness of breath  ZXH:Yzjmu rate 94, QTc 351, ventricular bigeminy is no obvious ST elevation or depression Chest x-ray mild cardiomegaly no acute changes CT angio: Moderate pericardial effusion, new from 09/19/2022. 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  bronchial thickening consistent with bronchitis or reactive airways disease, left lower lobe opacity ? PNA    -Continue with mechanical DVT prophylaxis, holding blood thinner such as heparin /Lovenox  - Obtaining echocardiogram

## 2024-03-16 NOTE — Consult Note (Signed)
 Cardiology Consultation   Patient ID: Erin Higgins MRN: [id number]; DOB: [birth year]  Admit date: 03/16/2024 Date of Consult: 03/16/2024  PCP:  Erin Norleen PEDLAR, MD   [facility] Providers Cardiologist:  Vishnu P Mallipeddi, MD   Patient Profile: Erin Higgins is a 53 y.o. female with a hx of systemic sclerosis, scleroderma, hypertension, who is being seen 03/16/2024 for the evaluation of pericardial effusion transferred from [facility].  History of Present Illness: [name] has no significant cardiac history.  She was seen in clinic 07/2022 when she was seen for chest pain in the ED.  Troponins were normal.  Eventually got a coronary CTA that demonstrated a CAC score of 0 and no evidence of any CAD.  Has not been seen since.  She does have history of rheumatoid arthritis, seronegative, and has chronically been on methotrexate  for this.  Also accompanied with Raynaud's phenomenon.  There is some consideration whether she would be a candidate for Biologics/DMARDs.  Has not started though.  Currently patient being evaluated for pericardial effusion that was first noted on CT scan.  However chief complaint was palpitations. She was transferred to [facility] to obtain echocardiogram.   Started on IV fluids with improvement.  Echocardiogram demonstrates preserved biventricular function with large pericardial effusion without diastolic collapse at that RV.  No respirophasic variation of the mitral valve velocities.  IVC plethora but nothing to demonstrate increased pericardial pressures.  Currently patient feels well overall.  She has not noticed any chest pain that is worse when lying flat or any chronic chest pain whatsoever.  Reports no shortness of breath.  However feels that she gets choked up a little bit when laying flat however sitting at a incline in bed and feeling well overall.  Denies any peripheral edema, orthopnea.  No recent illnesses.  She reports rare  occasions of mild chest discomfort at home but nothing new.  Troponins 56-56.  Pregnancy test negative.  Procalcitonin negative.  Potassium 3.2.  Creatinine 0.4.  Albumin 3.  Hemoglobin 9.8.  Past Medical History:  Diagnosis Date   Diverticulitis    Heartburn    Herpes simplex of female genitalia    Scleredema (HCC)    Tapeworm 04/2023    Past Surgical History:  Procedure Laterality Date   COLONOSCOPY     DILATION AND CURETTAGE OF UTERUS     TOOTH EXTRACTION  08/2023   TUBAL LIGATION  05/03/2012   Procedure: POST PARTUM TUBAL LIGATION;  Surgeon: Elveria Mungo, MD;  Location: [facility];  Service: Gynecology;  Laterality: Bilateral;  with filshie clips    Scheduled Meds:  amLODipine   2.5 mg Oral Daily   [START ON 03/17/2024] azithromycin   500 mg Oral Daily   famotidine   20 mg Oral QHS   LORazepam   1 mg Intravenous Once   [START ON 03/22/2024] methotrexate   20 mg Oral Weekly   sodium chloride  flush  3 mL Intravenous Q12H   sodium chloride  flush  3 mL Intravenous Q12H   Continuous Infusions:  sodium chloride  Stopped (03/16/24 1332)   PRN Meds: acetaminophen  **OR** acetaminophen , bisacodyl , hydrALAZINE , HYDROmorphone  (DILAUDID ) injection, ipratropium, levalbuterol , ondansetron  **OR** ondansetron  (ZOFRAN ) IV, oxyCODONE , senna-docusate, sodium phosphate, traZODone   Allergies:    Allergies  Allergen Reactions   Penicillins Swelling    Has patient had a PCN reaction causing immediate rash, facial/tongue/throat swelling, SOB or lightheadedness with hypotension: No  Has patient had a PCN reaction causing severe rash involving mucus membranes or skin necrosis:  No  Has patient had a PCN reaction that required hospitalization: No  Has patient had a PCN reaction occurring within the last 10 years: No  If all of the above answers are NO, then may proceed with Cephalosporin use.   Latex     Patient state she is not allergic to latex.    Social History:   Social History    Socioeconomic History   Marital status: Single    Spouse name: Not on file   Number of children: Not on file   Years of education: Not on file   Highest education level: Not on file  Occupational History   Not on file  Tobacco Use   Smoking status: Never    Passive exposure: Past   Smokeless tobacco: Never  Vaping Use   Vaping status: Never Used  Substance and Sexual Activity   Alcohol use: Not Currently    Alcohol/week: 1.0 standard drink of alcohol    Types: 1 Glasses of wine per week   Drug use: No   Sexual activity: Yes  Other Topics Concern   Not on file  Social History Narrative   Not on file   Social Drivers of Health   Financial Resource Strain: Not on file  Food Insecurity: Not on file  Transportation Needs: Not on file  Physical Activity: Not on file  Stress: Not on file  Social Connections: Not on file  Intimate Partner Violence: Not At Risk (12/17/2022)   [facility] from [facility]     Abuse Screen    Feels Unsafe at Home or Work/School: no    Feels Threatened by Someone: no    Does Anyone Try to Keep You From Having Contact with Others or Doing Things Outside Your Home?: no    Physical Signs of Abuse Present: no    Family History:   Family History  Problem Relation Age of Onset   Hyperlipidemia Mother    Hypertension Mother    Diabetes type II Mother    Sleep apnea Mother    Breast cancer Mother    Hypertension Father    Ulcerative colitis Father    Vitiligo Paternal Great-grandmother    Lupus Paternal Aunt      ROS:  Please see the history of present illness.  All other ROS reviewed and negative.     Physical Exam/Data: Vitals:   03/16/24 1130 03/16/24 1145 03/16/24 1154 03/16/24 1314  BP: 127/85 124/86  130/86  Pulse: 79 81 79 81  Resp: 18 (!) 25 (!) 21 18  Temp:   98 F (36.7 C) 98.3 F (36.8 C)  TempSrc:   Oral Oral  SpO2: 98% 98% 98%     Intake/Output Summary (Last 24 hours) at 03/16/2024 1348 Last data  filed at 03/16/2024 0957 Gross per 24 hour  Intake 650 ml  Output --  Net 650 ml      12/22/2023   11:11 AM 09/21/2023    9:56 AM 06/22/2023    9:48 AM  Last 3 Weights  Weight (lbs) 111 lb 3.2 oz 117 lb 119 lb  Weight (kg) 50.44 kg 53.071 kg 53.978 kg     There is no height or weight on file to calculate BMI.  General:  Well nourished, well developed, in no acute distress HEENT: normal Neck: no JVD Vascular: No carotid bruits; Distal pulses 2+ bilaterally Cardiac:  normal S1, S2; RRR; no murmur Lungs:  clear to auscultation bilaterally, no wheezing, rhonchi or rales  Abd:  soft, nontender, no hepatomegaly  Ext: no edema Musculoskeletal:  No deformities, BUE and BLE strength normal and equal Skin: warm and dry  Neuro:  CNs 2-12 intact, no focal abnormalities noted Psych:  Normal affect   EKG:  The EKG was personally reviewed and demonstrates: Sinus rhythm.  Heart rate 94.  PVCs.  Poor study with artifact noted throughout.  No obvious acute ST-T wave changes. Telemetry:  Telemetry was personally reviewed and demonstrates: Sinus rhythm heart rates in the 70s  Relevant CV Studies: Echocardiogram 03/16/2024 1. Left ventricular ejection fraction, by estimation, is 60 to 65%. The  left ventricle has normal function. The left ventricle has no regional  wall motion abnormalities. Left ventricular diastolic parameters were  normal.   2. Right ventricular systolic function is normal. The right ventricular  size is normal. Tricuspid regurgitation signal is inadequate for assessing  PA pressure.   3. Large pericardial effusion, without diastolic collapse of the RV, RA,  or variation of septal motion. There is no respirophasic variation of the  mitral valve velocities. 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 .  Large pericardial effusion. The pericardial effusion is circumferential.   4. The mitral valve is normal in structure. No evidence of mitral  valve  regurgitation. No evidence of mitral stenosis.   5. The aortic valve is tricuspid. Aortic valve regurgitation is not  visualized.   6. The inferior vena cava is dilated in size with <50% respiratory  variability, suggesting right atrial pressure of 15 mmHg.   Comparison(s): Prior images reviewed side by side. Pericardial effusion  has increased in size. Consider repeating echocardiogram limited next week  or if change in clinical situation.  She reports that  Laboratory Data: High Sensitivity Troponin:   Recent Labs  Lab 03/16/24 0332 03/16/24 0510  TROPONINIHS 56* 56*     Chemistry Recent Labs  Lab 03/16/24 0332  NA 140  K 3.2*  CL 110  CO2 23  GLUCOSE 97  BUN 13  CREATININE 0.40*  CALCIUM 8.3*  MG 1.7  GFRNONAA >60  ANIONGAP 7    Recent Labs  Lab 03/16/24 0332  PROT 6.8  ALBUMIN 3.0*  AST 23  ALT 12  ALKPHOS 46  BILITOT 0.8   Lipids No results for input(s): CHOL, TRIG, HDL, LABVLDL, LDLCALC, CHOLHDL in the last 168 hours.  Hematology Recent Labs  Lab 03/16/24 0332  WBC 4.8  RBC 3.48*  HGB 9.8*  HCT 31.5*  MCV 90.5  MCH 28.2  MCHC 31.1  RDW 16.1*  PLT 252   Thyroid  No results for input(s): TSH, FREET4 in the last 168 hours.  BNPNo results for input(s): BNP, PROBNP in the last 168 hours.  DDimer No results for input(s): DDIMER in the last 168 hours.  Radiology/Studies:  ECHOCARDIOGRAM COMPLETE Result Date: 03/16/2024    ECHOCARDIOGRAM REPORT   Patient Name:   Erin Higgins Date of Exam: 03/16/2024 Medical Rec #:  [id number]         Height:       65.0 in Accession #:    [id number]        Weight:       111.2 lb Date of Birth:  09-22-1970          BSA:          1.541 m Patient Age:    53 years          BP:  117/101 mmHg Patient Gender: F                 HR:           81 bpm. Exam Location:  [facility] Procedure: 2D Echo, Cardiac Doppler and Color Doppler (Both Spectral and Color            Flow Doppler were utilized  during procedure). Indications:    I31.3 Pericardial effusion (noninflammatory)  History:        Patient has prior history of Echocardiogram examinations, most                 recent 02/16/2023. Pericardial effusion.  Sonographer:    Ellouise Mose RDCS Referring Phys: [id number] SEYED A SHAHMEHDI  Sonographer Comments: Technically difficult study due to poor echo windows. IMPRESSIONS  1. Left ventricular ejection fraction, by estimation, is 60 to 65%. The left ventricle has normal function. The left ventricle has no regional wall motion abnormalities. Left ventricular diastolic parameters were normal.  2. Right ventricular systolic function is normal. The right ventricular size is normal. Tricuspid regurgitation signal is inadequate for assessing PA pressure.  3. Large pericardial effusion, without diastolic collapse of the RV, RA, or variation of septal motion. There is no respirophasic variation of the mitral valve velocities. 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 . Large pericardial effusion. The pericardial effusion is circumferential.  4. The mitral valve is normal in structure. No evidence of mitral valve regurgitation. No evidence of mitral stenosis.  5. The aortic valve is tricuspid. Aortic valve regurgitation is not visualized.  6. The inferior vena cava is dilated in size with <50% respiratory variability, suggesting right atrial pressure of 15 mmHg. Comparison(s): Prior images reviewed side by side. Pericardial effusion has increased in size. Consider repeating echocardiogram limited next week or if change in clinical situation. FINDINGS  Left Ventricle: Left ventricular ejection fraction, by estimation, is 60 to 65%. The left ventricle has normal function. The left ventricle has no regional wall motion abnormalities. The left ventricular internal cavity size was normal in size. There is  no left ventricular hypertrophy. Left ventricular diastolic parameters were normal.  Right Ventricle: The right ventricular size is normal. No increase in right ventricular wall thickness. Right ventricular systolic function is normal. Tricuspid regurgitation signal is inadequate for assessing PA pressure. Left Atrium: Left atrial size was normal in size. Right Atrium: Right atrial size was normal in size. Pericardium: Large pericardial effusion, without diastolic collapse of the RV, RA, or variation of septal motion. There is no respirophasic variation of the mitral valve velocities. 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 A large pericardial effusion is present. The pericardial effusion is circumferential. Mitral Valve: The mitral valve is normal in structure. No evidence of mitral valve regurgitation. No evidence of mitral valve stenosis. Tricuspid Valve: The tricuspid valve is normal in structure. Tricuspid valve regurgitation is not demonstrated. No evidence of tricuspid stenosis. Aortic Valve: The aortic valve is tricuspid. Aortic valve regurgitation is not visualized. Pulmonic Valve: The pulmonic valve was normal in structure. Pulmonic valve regurgitation is trivial. No evidence of pulmonic stenosis. Aorta: The aortic root is normal in size and structure. Venous: The inferior vena cava is dilated in size with less than 50% respiratory variability, suggesting right atrial pressure of 15 mmHg. IAS/Shunts: No atrial level shunt detected by color flow Doppler.  LEFT VENTRICLE PLAX 2D LVIDd:  4.60 cm     Diastology LVIDs:         2.83 cm     LV e' medial:    8.70 cm/s LV PW:         0.90 cm     LV E/e' medial:  8.0 LV IVS:        1.00 cm     LV e' lateral:   8.86 cm/s LVOT diam:     1.90 cm     LV E/e' lateral: 7.9 LV SV:         63 LV SV Index:   41 LVOT Area:     2.84 cm  LV Volumes (MOD) LV vol d, MOD A2C: 90.4 ml LV vol d, MOD A4C: 82.7 ml LV vol s, MOD A2C: 28.8 ml LV vol s, MOD A4C: 42.2 ml LV SV MOD A2C:     61.6 ml LV SV MOD A4C:     82.7 ml  LV SV MOD BP:      53.5 ml RIGHT VENTRICLE             IVC RV S prime:     19.00 cm/s  IVC diam: 2.30 cm TAPSE (M-mode): 2.6 cm LEFT ATRIUM           Index        RIGHT ATRIUM           Index LA diam:      2.90 cm 1.88 cm/m   RA Area:     11.00 cm LA Vol (A2C): 18.6 ml 12.07 ml/m  RA Volume:   20.50 ml  13.30 ml/m LA Vol (A4C): 22.7 ml 14.73 ml/m  AORTIC VALVE LVOT Vmax:   118.00 cm/s LVOT Vmean:  75.800 cm/s LVOT VTI:    0.221 m  AORTA Ao Root diam: 2.70 cm MITRAL VALVE MV Area (PHT): 4.06 cm    SHUNTS MV Decel Time: 187 msec    Systemic VTI:  0.22 m MV E velocity: 69.70 cm/s  Systemic Diam: 1.90 cm MV A velocity: 66.50 cm/s MV E/A ratio:  1.05 [name] MD Electronically signed by [name] MD Signature Date/Time: 03/16/2024/1:02:55 PM    Final    CT Angio Chest PE W and/or Wo Contrast Result Date: 03/16/2024 CLINICAL DATA:  Heart palpitations, awakened the patient from sleep this morning. EXAM: CT ANGIOGRAPHY CHEST WITH CONTRAST TECHNIQUE: Multidetector CT imaging of the chest was performed using the standard protocol during bolus administration of intravenous contrast. Multiplanar CT image reconstructions and MIPs were obtained to evaluate the vascular anatomy. RADIATION DOSE REDUCTION: This exam was performed according to the departmental dose-optimization program which includes automated exposure control, adjustment of the mA and/or kV according to patient size and/or use of iterative reconstruction technique. CONTRAST:  75mL OMNIPAQUE  IOHEXOL  350 MG/ML SOLN COMPARISON:  AP Lat chest today, portable chest 07/12/2022, and cardiac partial chest CT with contrast 09/19/2022. 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 Moderate pericardial fluid is seen new from 09/19/2022, maximum thickness 2 cm. The fluid is low in density, 18 Hounsfield units and not suspicious for pericardial hemorrhage. The pulmonary arteries are well  opacified, normal caliber and clear through the segmental divisions. The subsegmental arteries are not well seen due to respiratory motion. They're normal where visible. The aorta and great vessels are normal. The pulmonary veins are normal in caliber. There is mild cardiomegaly, seen previously. No visible coronary calcification. Mediastinum/Nodes: No enlarged mediastinal, hilar,  or axillary lymph nodes. The lower poles of the thyroid  gland, trachea, and esophagus demonstrate no significant findings. Lungs/Pleura: There is diffuse bronchial thickening. There is increased left lower lobe infrahilar opacity concerning for pneumonia or aspiration. The central airways are clear. Mild chronic elevation right hemidiaphragm. Linear scar-like opacities symmetrically bright apex, outer right upper lobe. Again noted is a 3 mm subpleural right middle lobe solid nodule on 6:56, and 2 adjacent 4 mm right lower lobe superior segment nodules on 6:65, all unchanged. No follow-up imaging is recommended. Benign process given length of stability. The lungs are otherwise clear. Upper Abdomen: No acute abnormality. Musculoskeletal: Slight thoracic levoscoliosis. No acute or significant osseous findings. No chest wall mass is seen. Review of the MIP images confirms the above findings. IMPRESSION: 1. No evidence of arterial dilatation or embolus through the segmental divisions. The subsegmental arteries are not well seen due to respiratory motion. 2. Moderate pericardial effusion, new from 09/19/2022. 3. 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 4. Diffuse bronchial thickening consistent with bronchitis or reactive airways disease. 5. Increased left lower lobe infrahilar opacity concerning for pneumonia or aspiration. Electronically Signed   By: Francis Quam M.D.   On: 03/16/2024 07:22   DG Chest 2 View Result Date: 03/16/2024 CLINICAL DATA:  Heart palpitations and chest discomfort. EXAM: CHEST - 2 VIEW  COMPARISON:  AP chest portable 07/12/2022. FINDINGS: There is mild cardiomegaly. No vascular congestion is seen. No edema or pleural collections. The lungs are clear of infiltrates. The mediastinum is normally outlined. Thoracic cage is intact with slight thoracic levoscoliosis. Multiple superimposed telemetry leads. IMPRESSION: Mild cardiomegaly. No acute chest findings. Electronically Signed   By: Francis Quam M.D.   On: 03/16/2024 03:02     Assessment and Plan: . Large circumferential pericardial effusion Likely precipitated by underlying RA/scleroderma.  Echocardiogram with preserved biventricular function.  No diastolic collapse of the RV.  No respirophasic variation of the mitral valve velocities.  IVC is dilated but no findings of increased pericardial pressure.  Presentation and echo findings are not consistent with pericardial tamponade.  Presentation also not consistent with pericarditis, EKG with no ST-T wave changes and with no real complaints of chest pain. May consider repeating limited echocardiogram in about a week to ensure no evolving changes or if sooner if clinical picture indicates. Then again may be repeating 3 months. Will check a ESR/CRP.  These are likely going to be chronic but there may still be a role for prednisone/colchicine to help guide therapy duration, but overall asx. Will discuss with MD. May also consider involving rheumatology to get their input. Stopping IVF.  RA Scleroderma with Raynaud's Follows with rheumatologist.  Some considerations of DMARD in the future Continue amlodipine  2.5 mg  Risk Assessment/Risk Scores:   For questions or updates, please contact  [facility] Please consult [email or web address] for contact info under   Signed, Thom LITTIE Sluder, PA-C  03/16/2024 1:48 PM

## 2024-03-16 NOTE — Assessment & Plan Note (Signed)
 Serum magnesium  1.7 -Replacing with 2 g of magnesium  IV

## 2024-03-16 NOTE — Assessment & Plan Note (Signed)
 Continue vitamin D supplements.

## 2024-03-16 NOTE — ED Provider Notes (Signed)
 I discussed the care with the cardiologist Dr. Inocencio who reports that they will be happy to see the patient in consultation regarding the pericardial effusion, I discussed the care with Dr. Willette with the hospitalist service who will arrange for admission to [facility], the patient has appeared hemodynamically stable though she does have a moderate pericardial effusion, troponins are not uptrending to any significant degree   Cleotilde Rogue, MD 03/16/24 [phone]

## 2024-03-16 NOTE — ED Provider Notes (Signed)
 Emergency Department Provider Note  TRIAGE NOTE: Pt woke up having palpitations. She has not had this occur before. Takes methotrexate  for autoimmune diseases. Sees Rheumatology. EMS gave fluid and little oxygen. Her palpitations have improved but still feels them. She states she was out and active more than usual today. Has wedding tomorrow she would like attend.   HISTORY  Chief Complaint Palpitations   HPI Erin Higgins is a 53 y.o. female with  palpitations. No h/o same. Woke up this morning with a funny feeling in her chest like her heart wasn't beating correctly. Felt like it was beating twice then pausing. No lightheadedness, sob, diaphoresis. Recently dx w/ scleroderma, Raynauds, HTN. On some new medications for these things. No change in OTC meds, caffeine, etoh, cigarette usage. No recent illnesses.   PMH Past Medical History:  Diagnosis Date   Diverticulitis    Heartburn    Herpes simplex of female genitalia    Scleredema (HCC)    Tapeworm 04/2023    Home Medications Prior to Admission medications   Medication Sig Start Date End Date Taking? Authorizing Provider  amLODipine  (NORVASC ) 2.5 MG tablet TAKE 1 TABLET(2.5 MG) BY MOUTH DAILY 10/14/22   Mallipeddi, Vishnu P, MD  B Complex Vitamins (B COMPLEX VITAMIN PO) Take by mouth. liquid    [provider]  famotidine  (PEPCID ) 20 MG tablet Take 1 tablet by mouth daily. 07/25/23   [provider]  liothyronine (CYTOMEL) 5 MCG tablet Take 5 mcg by mouth daily. Patient taking differently: Take 5 mcg by mouth as needed.    [provider]  magic mouthwash SOLN Swish and swallow 5 mLs 3 (three) times daily as needed for mouth pain. Patient taking differently: as needed. Swish and swallow 5 mLs 3 (three) times daily as needed for mouth pain. 09/21/23   Rice, Lonni ORN, MD  methotrexate  (RHEUMATREX) 2.5 MG tablet Take 8 tablets (20 mg total) by mouth once a week. 12/22/23   Rice, Lonni ORN, MD   omeprazole  (PRILOSEC) 20 MG capsule Take 1 capsule (20 mg total) by mouth 2 (two) times daily before a meal. Patient not taking: Reported on 12/22/2023 05/22/23   Stacia Glendia BRAVO, MD  tiZANidine  (ZANAFLEX ) 4 MG capsule Take 1 capsule (4 mg total) by mouth 3 (three) times daily as needed for muscle spasms. Do not drink alcohol or drive while taking this medication.  May cause drowsiness. Patient not taking: Reported on 12/22/2023 07/18/23   Stuart Vernell Norris, PA-C  Vitamin D-Vitamin K (VITAMIN K2-VITAMIN D3 PO) Take by mouth. liquid    [provider]    Social History Social History   Tobacco Use   Smoking status: Never    Passive exposure: Past   Smokeless tobacco: Never  Vaping Use   Vaping status: Never Used  Substance Use Topics   Alcohol use: Not Currently    Alcohol/week: 1.0 standard drink of alcohol    Types: 1 Glasses of wine per week   Drug use: No    Review of Systems: Documented in HPI ____________________________________________  PHYSICAL EXAM: VITAL SIGNS: Triage: Blood pressure 104/65, pulse 61, temperature 98.8 F (37.1 C), temperature source Oral, resp. rate (!) 24, SpO2 97%.  Vitals:   03/16/24 0415 03/16/24 0430 03/16/24 0530 03/16/24 0545  BP: 115/84 (!) 125/93 107/73 104/65  Pulse: (!) 55 70 71 61  Resp: (!) 22 19 (!) 24 (!) 24  Temp:      TempSrc:      SpO2:  97% 97% 98% 97%    Physical Exam Vitals and nursing note reviewed.  Constitutional:      Appearance: She is well-developed.  HENT:     Head: Normocephalic and atraumatic.  Cardiovascular:     Rate and Rhythm: Normal rate. Rhythm irregular.  Pulmonary:     Effort: No respiratory distress.     Breath sounds: No stridor.  Abdominal:     General: There is no distension.  Musculoskeletal:     Cervical back: Normal range of motion.  Skin:    General: Skin is warm and dry.  Neurological:     Mental Status: She is alert.        ____________________________________________   LABS (all labs ordered are listed, but only abnormal results are displayed)  Labs Reviewed  CBC WITH DIFFERENTIAL/PLATELET - Abnormal; Notable for the following components:      Result Value   RBC 3.48 (*)    Hemoglobin 9.8 (*)    HCT 31.5 (*)    RDW 16.1 (*)    All other components within normal limits  COMPREHENSIVE METABOLIC PANEL WITH GFR - Abnormal; Notable for the following components:   Potassium 3.2 (*)    Creatinine, Ser 0.40 (*)    Calcium 8.3 (*)    Albumin 3.0 (*)    All other components within normal limits  TROPONIN I (HIGH SENSITIVITY) - Abnormal; Notable for the following components:   Troponin I (High Sensitivity) 56 (*)    All other components within normal limits  TROPONIN I (HIGH SENSITIVITY) - Abnormal; Notable for the following components:   Troponin I (High Sensitivity) 56 (*)    All other components within normal limits  LIPASE, BLOOD  MAGNESIUM   HCG, QUANTITATIVE, PREGNANCY   ____________________________________________  EKG   EKG Interpretation Date/Time:    Ventricular Rate:    PR Interval:    QRS Duration:    QT Interval:    QTC Calculation:   R Axis:      Text Interpretation:          ____________________________________________  RADIOLOGY  DG Chest 2 View Result Date: 03/16/2024 CLINICAL DATA:  Heart palpitations and chest discomfort. EXAM: CHEST - 2 VIEW COMPARISON:  AP chest portable 07/12/2022. FINDINGS: There is mild cardiomegaly. No vascular congestion is seen. No edema or pleural collections. The lungs are clear of infiltrates. The mediastinum is normally outlined. Thoracic cage is intact with slight thoracic levoscoliosis. Multiple superimposed telemetry leads. IMPRESSION: Mild cardiomegaly. No acute chest findings. Electronically Signed   By: Francis Quam M.D.   On: 03/16/2024 03:02   ____________________________________________  PROCEDURES  Procedure(s) performed:    Procedures ____________________________________________  INITIAL IMPRESSION / ASSESSMENT AND PLAN     Initial DDx:        ED Course   Patient presented with an abnormal feeling in her chest with associated palpitations.  She basically describes what sounds like PACs and EMS actually did get a rhythm that showed what looks like consistent with PACs.  No shortness of breath.  No worsening with deep breathing.  No lower extremity swelling that is new but she has scleroderma so she has pain all over and the pain and swelling are consistent.  She states that she has no recent illnesses but has had a little of a cough.  Initial concern was for electrolyte derangement, intermittent A-fib, PACs.  Most of her blood test returned as normal but she did have elevated troponin this with the palpitations and her history  I did a CT scan which showed a moderate pericardial effusion, questionable left lower lobe pneumonia and no PE or dissection within the limitations of her respiratory motion. Will consult cardiology. Don't think this is NSTEMI, likely related to the scleroderma, but may need to go to [facility] to get the echo and cardiology consultation.       Images ordered viewed and obtained by myself. Agree with Radiology interpretation. Details in ED course.  Labs ordered reviewed by myself as detailed in ED course.  Consultations obtained/considered detailed in ED course.   FINAL IMPRESSION Final diagnoses:  Pericardial effusion     Disposition Care transferred to Dr. Cleotilde pending cardiology call back for appropriate disposition.  ____________________________________________   NEW OUTPATIENT MEDICATIONS STARTED DURING THIS VISIT:  New Prescriptions   No medications on file    Note:  This note was prepared with assistance of Dragon voice recognition software. Occasional wrong-word or sound-a-like substitutions may have occurred due to the inherent limitations of voice recognition  software.    Lorette Mayo, MD 03/16/24 [phone]

## 2024-03-16 NOTE — Hospital Course (Addendum)
 Erin Higgins is a 53 year old female with a history of systemic sclerosis, scleroderma on methotrexate , HTN, vitamin vitamin D deficiency-presented to ED with chief complaint of palpitation, which improved with IV fluid hydration, supplemental oxygen.  Denied any chest pain, shortness of breath, or diaphoresis.  ED evaluation: Blood pressure (!) 117/101, pulse 85, temperature 98.1 F (36.7 C), temperature source Oral, resp. rate 20, SpO2 98%.  LABs: Hemoglobin 9.8, potassium 3.2, calcium 8.3, magnesium  1.7 Troponin 56, 56,  ZXH:Yzjmu rate 94, QTc 351, ventricular bigeminy is no obvious ST elevation or depressio   Chest x-ray mild cardiomegaly no acute changes CT angio: Moderate pericardial effusion, new from 09/19/2022. 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  bronchial thickening consistent with bronchitis or reactive airways disease, left lower lobe opacity ? PNA

## 2024-03-16 NOTE — ED Triage Notes (Signed)
 Pt woke up having palpitations. She has not had this occur before. Takes methotrexate  for autoimmune diseases. Sees Rheumatology. EMS gave fluid and little oxygen. Her palpitations have improved but still feels them. She states she was out and active more than usual today. Has wedding tomorrow she would like attend.

## 2024-03-17 DIAGNOSIS — I3139 Other pericardial effusion (noninflammatory): Secondary | ICD-10-CM

## 2024-03-17 LAB — CBC
HCT: 29.2 % — ABNORMAL LOW (ref 36.0–46.0)
Hemoglobin: 9.3 g/dL — ABNORMAL LOW (ref 12.0–15.0)
MCH: 28.1 pg (ref 26.0–34.0)
MCHC: 31.8 g/dL (ref 30.0–36.0)
MCV: 88.2 fL (ref 80.0–100.0)
Platelets: 265 K/uL (ref 150–400)
RBC: 3.31 MIL/uL — ABNORMAL LOW (ref 3.87–5.11)
RDW: 16 % — ABNORMAL HIGH (ref 11.5–15.5)
WBC: 4.6 K/uL (ref 4.0–10.5)
nRBC: 0 % (ref 0.0–0.2)

## 2024-03-17 LAB — BASIC METABOLIC PANEL WITH GFR
Anion gap: 8 (ref 5–15)
BUN: 10 mg/dL (ref 6–20)
CO2: 22 mmol/L (ref 22–32)
Calcium: 8.5 mg/dL — ABNORMAL LOW (ref 8.9–10.3)
Chloride: 109 mmol/L (ref 98–111)
Creatinine, Ser: 0.42 mg/dL — ABNORMAL LOW (ref 0.44–1.00)
GFR, Estimated: >60 mL/min (ref >60)
Glucose, Bld: 115 mg/dL — ABNORMAL HIGH (ref 70–99)
Potassium: 3.7 mmol/L (ref 3.5–5.1)
Sodium: 139 mmol/L (ref 135–145)

## 2024-03-17 LAB — APTT: aPTT: 36 s (ref 24–36)

## 2024-03-17 LAB — PROTIME-INR
INR: 1.1 (ref 0.8–1.2)
Prothrombin Time: 15.3 s — ABNORMAL HIGH (ref 11.4–15.2)

## 2024-03-17 NOTE — Progress Notes (Signed)
  Progress Note   Patient: Erin Higgins FMW:[id number] DOB: 1970-11-24 DOA: 03/16/2024     1 DOS: the patient was seen and examined on 03/17/2024   Brief hospital course: 53 year old female with a history of systemic sclerosis, scleroderma on methotrexate , HTN, vitamin vitamin D deficiency-presented to ED with chief complaint of palpitation, which improved with IV fluid hydration, supplemental oxygen.  Denied any chest pain, shortness of breath, or diaphoresis.   ED evaluation: Blood pressure (!) 117/101, pulse 85, temperature 98.1 F (36.7 C), temperature source Oral, resp. rate 20, SpO2 98%.   LABs: Hemoglobin 9.8, potassium 3.2, calcium 8.3, magnesium  1.7 Troponin 56, 56,   ZXH:Yzjmu rate 94, QTc 351, ventricular bigeminy is no obvious ST elevation or depressio     Chest x-ray mild cardiomegaly no acute changes CT angio: Moderate pericardial effusion, new from 09/19/2022. 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 Lonni Ester as outpt   Sclerodactyly Sclerodactyly daily with RA Stable - Continue home medication including methotrexate  q. weekly   Vitamin D deficiency - Continue vitamin D supplements   Bronchitis Continue azithromycin  -for presumed bronchitis - procal<0.10 -Patient is afebrile, normotensive, with no leukocytosis   Hypomagnesemia -Goal Mg >2 -Cont to correct as needed   Hypokalemia - cont to replace K as needed    Subjective: Requesting a dysphagia diet given known hx of esophageal involvement with scleroderma  Physical Exam: Vitals:   03/17/24 0033 03/17/24 0200 03/17/24 0257 03/17/24 0804  BP: 115/85  115/77 116/84  Pulse: 82 81 79 79  Resp: 18  18 (P) 17  Temp: 98.3 F (36.8 C)  98.1 F (36.7 C) (P) 98 F (36.7 C)  TempSrc: Oral  Oral (P) Oral  SpO2: 100% 100% 100% 100%  Weight:   50.4 kg   Height:       General exam: Awake, laying in bed, in nad Respiratory system: Normal respiratory effort, no wheezing Cardiovascular system: regular rate, s1, s2 Gastrointestinal system: Soft, nondistended, positive BS Central nervous system: CN2-12 grossly intact, strength intact Extremities: Perfused, no clubbing Skin: Normal skin turgor, no notable skin lesions seen Psychiatry: Mood normal // no visual hallucinations   Data Reviewed:  Labs reviewed: Na 139, K 3.7, Cr 0.42, WBC 4.6, Hgb 9.3, Plts 265  Family Communication: Pt in room, family not at bedside  Disposition: Status is: Inpatient Remains inpatient appropriate because: severity of illness  Planned Discharge Destination: Home    Author: Garnette Pelt, MD 03/17/2024 3:56 PM  For on call review [email or web address].

## 2024-03-17 NOTE — Progress Notes (Signed)
 DAILY PROGRESS NOTE   Patient Name: Erin Higgins Date of Encounter: 03/17/2024 Cardiologist: Vishnu P Mallipeddi, MD  Chief Complaint   No chest pain overnight  Patient Profile   Erin Higgins is a 53 y.o. female with a hx of systemic sclerosis, scleroderma, hypertension, who is being seen 03/16/2024 for the evaluation of pericardial effusion transferred from [facility]   Subjective   No chest pain, BP stable.  CRP 0.7. ESR 12.   Objective   Vitals:   03/17/24 0033 03/17/24 0200 03/17/24 0257 03/17/24 0804  BP: 115/85  115/77 116/84  Pulse: 82 81 79 79  Resp: 18  18 (P) 17  Temp: 98.3 F (36.8 C)  98.1 F (36.7 C) (P) 98 F (36.7 C)  TempSrc: Oral  Oral (P) Oral  SpO2: 100% 100% 100% 100%  Weight:   50.4 kg   Height:        Intake/Output Summary (Last 24 hours) at 03/17/2024 1149 Last data filed at 03/16/2024 2108 Gross per 24 hour  Intake 631.89 ml  Output --  Net 631.89 ml   Filed Weights   03/16/24 1643 03/17/24 0257  Weight: 50.2 kg 50.4 kg    Physical Exam   General appearance: alert and no distress Lungs: clear to auscultation bilaterally Heart: regular rate and rhythm Extremities: extremities normal, atraumatic, no cyanosis or edema Skin: scleroderma Neurologic: Grossly normal  Inpatient Medications    Scheduled Meds:  amLODipine   2.5 mg Oral Daily   azithromycin   500 mg Oral Daily   famotidine   20 mg Oral QHS   LORazepam   1 mg Intravenous Once   [START ON 03/22/2024] methotrexate   20 mg Oral Weekly   sodium chloride  flush  3 mL Intravenous Q12H   sodium chloride  flush  3 mL Intravenous Q12H    Continuous Infusions:   PRN Meds: acetaminophen  **OR** acetaminophen , bisacodyl , hydrALAZINE , HYDROmorphone  (DILAUDID ) injection, ipratropium, levalbuterol , ondansetron  **OR** ondansetron  (ZOFRAN ) IV, oxyCODONE , senna-docusate, sodium phosphate, traZODone    Labs   Results for orders placed or performed during the hospital  encounter of 03/16/24 (from the past 48 hours)  CBC with Differential     Status: Abnormal   Collection Time: 03/16/24  3:32 AM  Result Value Ref Range   WBC 4.8 4.0 - 10.5 K/uL   RBC 3.48 (L) 3.87 - 5.11 MIL/uL   Hemoglobin 9.8 (L) 12.0 - 15.0 g/dL   HCT 68.4 (L) 63.9 - 53.9 %   MCV 90.5 80.0 - 100.0 fL   MCH 28.2 26.0 - 34.0 pg   MCHC 31.1 30.0 - 36.0 g/dL   RDW 83.8 (H) 88.4 - 84.4 %   Platelets 252 150 - 400 K/uL   nRBC 0.0 0.0 - 0.2 %   Neutrophils Relative % 51 %   Neutro Abs 2.4 1.7 - 7.7 K/uL   Lymphocytes Relative 38 %   Lymphs Abs 1.8 0.7 - 4.0 K/uL   Monocytes Relative 10 %   Monocytes Absolute 0.5 0.1 - 1.0 K/uL   Eosinophils Relative 1 %   Eosinophils Absolute 0.0 0.0 - 0.5 K/uL   Basophils Relative 0 %   Basophils Absolute 0.0 0.0 - 0.1 K/uL   Immature Granulocytes 0 %   Abs Immature Granulocytes 0.02 0.00 - 0.07 K/uL    Comment: Performed at [facility], [address], [place], [place] [address]  Comprehensive metabolic panel     Status: Abnormal   Collection Time: 03/16/24  3:32 AM  Result Value  Ref Range   Sodium 140 135 - 145 mmol/L   Potassium 3.2 (L) 3.5 - 5.1 mmol/L   Chloride 110 98 - 111 mmol/L   CO2 23 22 - 32 mmol/L   Glucose, Bld 97 70 - 99 mg/dL    Comment: Glucose reference range applies only to samples taken after fasting for at least 8 hours.   BUN 13 6 - 20 mg/dL   Creatinine, Ser 9.59 (L) 0.44 - 1.00 mg/dL   Calcium 8.3 (L) 8.9 - 10.3 mg/dL   Total Protein 6.8 6.5 - 8.1 g/dL   Albumin 3.0 (L) 3.5 - 5.0 g/dL   AST 23 15 - 41 U/L   ALT 12 0 - 44 U/L   Alkaline Phosphatase 46 38 - 126 U/L   Total Bilirubin 0.8 0.0 - 1.2 mg/dL   GFR, Estimated >39 >39 mL/min    Comment: (NOTE) Calculated using the CKD-EPI Creatinine Equation (2021)    Anion gap 7 5 - 15    Comment: Performed at [facility], [address], [place], [place] [address]  Lipase, blood     Status: None   Collection Time: 03/16/24  3:32 AM  Result Value Ref Range   Lipase  34 11 - 51 U/L    Comment: Performed at [facility], [address], [place], [place] [address]  Magnesium      Status: None   Collection Time: 03/16/24  3:32 AM  Result Value Ref Range   Magnesium  1.7 1.7 - 2.4 mg/dL    Comment: Performed at [facility], [address] [place], [place] [address]  Troponin I (High Sensitivity)     Status: Abnormal   Collection Time: 03/16/24  3:32 AM  Result Value Ref Range   Troponin I (High Sensitivity) 56 (H) <18 ng/L    Comment: (NOTE) Elevated high sensitivity troponin I (hsTnI) values and significant  changes across serial measurements may suggest ACS but many other  chronic and acute conditions are known to elevate hsTnI results.  Refer to the Links section for chest pain algorithms and additional  guidance. Performed at [facility], [address], [place], [place] [address]   Troponin I (High Sensitivity)     Status: Abnormal   Collection Time: 03/16/24  5:10 AM  Result Value Ref Range   Troponin I (High Sensitivity) 56 (H) <18 ng/L    Comment: (NOTE) Elevated high sensitivity troponin I (hsTnI) values and significant  changes across serial measurements may suggest ACS but many other  chronic and acute conditions are known to elevate hsTnI results.  Refer to the Links section for chest pain algorithms and additional  guidance. Performed at [facility], [address], [place], [place] [address]   hCG, quantitative, pregnancy     Status: None   Collection Time: 03/16/24  5:10 AM  Result Value Ref Range   hCG, Beta Chain, Quant, S 1 <5 mIU/mL    Comment:          GEST. AGE      CONC.  (mIU/mL)   <=1 WEEK        5 - 50     2 WEEKS       50 - 500     3 WEEKS       100 - 10,000     4 WEEKS     1,000 - 30,000     5 WEEKS     3,500 - 115,000   6-8 WEEKS  12,000 - 270,000    12 WEEKS     15,000 - 220,000        FEMALE AND NON-PREGNANT FEMALE:     LESS THAN 5 mIU/mL Performed at [facility], [address] [place], [place]  [address]   HIV Antibody (routine testing w rflx)     Status: None   Collection Time: 03/16/24 10:15 AM  Result Value Ref Range   HIV Screen 4th Generation wRfx Non Reactive Non Reactive    Comment: Performed at [facility], [address], [place], [place] [address]  Phosphorus     Status: Abnormal   Collection Time: 03/16/24 10:15 AM  Result Value Ref Range   Phosphorus 2.4 (L) 2.5 - 4.6 mg/dL    Comment: HEMOLYSIS AT THIS LEVEL MAY AFFECT RESULT Performed at [facility], [address] [place], [place] [address]   Protime-INR     Status: Abnormal   Collection Time: 03/16/24 10:15 AM  Result Value Ref Range   Prothrombin Time 16.7 (H) 11.4 - 15.2 seconds   INR 1.3 (H) 0.8 - 1.2    Comment: (NOTE) INR goal varies based on device and disease states. Performed at [facility], [address], [place], [place] [address]   Procalcitonin     Status: None   Collection Time: 03/16/24 10:25 AM  Result Value Ref Range   Procalcitonin <0.10 ng/mL    Comment:        Interpretation: PCT (Procalcitonin) <= 0.5 ng/mL: Systemic infection (sepsis) is not likely. Local bacterial infection is possible. (NOTE)       Sepsis PCT Algorithm           Lower Respiratory Tract                                      Infection PCT Algorithm    ----------------------------     ----------------------------         PCT < 0.25 ng/mL                PCT < 0.10 ng/mL          Strongly encourage             Strongly discourage   discontinuation of antibiotics    initiation of antibiotics    ----------------------------     -----------------------------       PCT 0.25 - 0.50 ng/mL            PCT 0.10 - 0.25 ng/mL               OR       >80% decrease in PCT            Discourage initiation of                                            antibiotics      Encourage discontinuation           of antibiotics    ----------------------------     -----------------------------         PCT >= 0.50 ng/mL               PCT 0.26 - 0.50 ng/mL  AND        <80% decrease in PCT             Encourage initiation of                                             antibiotics       Encourage continuation           of antibiotics    ----------------------------     -----------------------------        PCT >= 0.50 ng/mL                  PCT > 0.50 ng/mL               AND         increase in PCT                  Strongly encourage                                      initiation of antibiotics    Strongly encourage escalation           of antibiotics                                     -----------------------------                                           PCT <= 0.25 ng/mL                                                 OR                                        > 80% decrease in PCT                                      Discontinue / Do not initiate                                             antibiotics  Performed at [facility], [address] [place], [place] [address]   Sedimentation rate     Status: None   Collection Time: 03/16/24  3:45 PM  Result Value Ref Range   Sed Rate 12 0 - 22 mm/hr    Comment: Performed at [facility], [address] [place], [place] [address]  C-reactive protein     Status: None   Collection Time: 03/16/24  3:45 PM  Result Value Ref Range   CRP 0.7 <1.0 mg/dL    Comment: Performed at [facility], [address], [place], [place] [address]  Basic metabolic panel     Status: Abnormal   Collection Time: 03/17/24  3:34 AM  Result Value Ref Range   Sodium 139 135 - 145 mmol/L   Potassium 3.7 3.5 - 5.1 mmol/L   Chloride 109 98 - 111 mmol/L   CO2 22 22 - 32 mmol/L   Glucose, Bld 115 (H) 70 - 99 mg/dL    Comment: Glucose reference range applies only to samples taken after fasting for at least 8 hours.   BUN 10 6 - 20 mg/dL   Creatinine, Ser 9.57 (L) 0.44 - 1.00 mg/dL   Calcium 8.5 (L) 8.9 - 10.3 mg/dL   GFR, Estimated >39 >39 mL/min    Comment:  (NOTE) Calculated using the CKD-EPI Creatinine Equation (2021)    Anion gap 8 5 - 15    Comment: Performed at [facility], [address] [place], [place] [address]  CBC     Status: Abnormal   Collection Time: 03/17/24  3:34 AM  Result Value Ref Range   WBC 4.6 4.0 - 10.5 K/uL   RBC 3.31 (L) 3.87 - 5.11 MIL/uL   Hemoglobin 9.3 (L) 12.0 - 15.0 g/dL   HCT 70.7 (L) 63.9 - 53.9 %   MCV 88.2 80.0 - 100.0 fL   MCH 28.1 26.0 - 34.0 pg   MCHC 31.8 30.0 - 36.0 g/dL   RDW 83.9 (H) 88.4 - 84.4 %   Platelets 265 150 - 400 K/uL   nRBC 0.0 0.0 - 0.2 %    Comment: Performed at [facility], [address], [place], [place] [address]  Protime-INR     Status: Abnormal   Collection Time: 03/17/24  3:34 AM  Result Value Ref Range   Prothrombin Time 15.3 (H) 11.4 - 15.2 seconds   INR 1.1 0.8 - 1.2    Comment: (NOTE) INR goal varies based on device and disease states. Performed at [facility], [address] [place], [place] [address]   APTT     Status: None   Collection Time: 03/17/24  3:34 AM  Result Value Ref Range   aPTT 36 24 - 36 seconds    Comment: Performed at [facility], [address], [place], [place] [address]    ECG   N/A  Telemetry   Sinus rhythm at 80 - Personally Reviewed  Radiology    ECHOCARDIOGRAM COMPLETE Result Date: 03/16/2024    ECHOCARDIOGRAM REPORT   Patient Name:   TEQUISHA MAAHS Date of Exam: 03/16/2024 Medical Rec #:  [id number]         Height:       65.0 in Accession #:    [id number]        Weight:       111.2 lb Date of Birth:  12-Apr-1971          BSA:          1.541 m Patient Age:    53 years          BP:           117/101 mmHg Patient Gender: F                 HR:           81 bpm. Exam Location:  [facility] Procedure: 2D Echo, Cardiac Doppler and Color Doppler (Both Spectral and Color  Flow Doppler were utilized during procedure). Indications:    I31.3 Pericardial effusion (noninflammatory)  History:        Patient has  prior history of Echocardiogram examinations, most                 recent 02/16/2023. Pericardial effusion.  Sonographer:    Ellouise Mose RDCS Referring Phys: [id number] SEYED A SHAHMEHDI  Sonographer Comments: Technically difficult study due to poor echo windows. IMPRESSIONS  1. Left ventricular ejection fraction, by estimation, is 60 to 65%. The left ventricle has normal function. The left ventricle has no regional wall motion abnormalities. Left ventricular diastolic parameters were normal.  2. Right ventricular systolic function is normal. The right ventricular size is normal. Tricuspid regurgitation signal is inadequate for assessing PA pressure.  3. Large pericardial effusion, without diastolic collapse of the RV, RA, or variation of septal motion. There is no respirophasic variation of the mitral valve velocities. 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 . Large pericardial effusion. The pericardial effusion is circumferential.  4. The mitral valve is normal in structure. No evidence of mitral valve regurgitation. No evidence of mitral stenosis.  5. The aortic valve is tricuspid. Aortic valve regurgitation is not visualized.  6. The inferior vena cava is dilated in size with <50% respiratory variability, suggesting right atrial pressure of 15 mmHg. Comparison(s): Prior images reviewed side by side. Pericardial effusion has increased in size. Consider repeating echocardiogram limited next week or if change in clinical situation. FINDINGS  Left Ventricle: Left ventricular ejection fraction, by estimation, is 60 to 65%. The left ventricle has normal function. The left ventricle has no regional wall motion abnormalities. The left ventricular internal cavity size was normal in size. There is  no left ventricular hypertrophy. Left ventricular diastolic parameters were normal. Right Ventricle: The right ventricular size is normal. No increase in right ventricular wall thickness. Right  ventricular systolic function is normal. Tricuspid regurgitation signal is inadequate for assessing PA pressure. Left Atrium: Left atrial size was normal in size. Right Atrium: Right atrial size was normal in size. Pericardium: Large pericardial effusion, without diastolic collapse of the RV, RA, or variation of septal motion. There is no respirophasic variation of the mitral valve velocities. 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 A large pericardial effusion is present. The pericardial effusion is circumferential. Mitral Valve: The mitral valve is normal in structure. No evidence of mitral valve regurgitation. No evidence of mitral valve stenosis. Tricuspid Valve: The tricuspid valve is normal in structure. Tricuspid valve regurgitation is not demonstrated. No evidence of tricuspid stenosis. Aortic Valve: The aortic valve is tricuspid. Aortic valve regurgitation is not visualized. Pulmonic Valve: The pulmonic valve was normal in structure. Pulmonic valve regurgitation is trivial. No evidence of pulmonic stenosis. Aorta: The aortic root is normal in size and structure. Venous: The inferior vena cava is dilated in size with less than 50% respiratory variability, suggesting right atrial pressure of 15 mmHg. IAS/Shunts: No atrial level shunt detected by color flow Doppler.  LEFT VENTRICLE PLAX 2D LVIDd:         4.60 cm     Diastology LVIDs:         2.83 cm     LV e' medial:    8.70 cm/s LV PW:         0.90 cm     LV E/e' medial:  8.0 LV IVS:  1.00 cm     LV e' lateral:   8.86 cm/s LVOT diam:     1.90 cm     LV E/e' lateral: 7.9 LV SV:         63 LV SV Index:   41 LVOT Area:     2.84 cm  LV Volumes (MOD) LV vol d, MOD A2C: 90.4 ml LV vol d, MOD A4C: 82.7 ml LV vol s, MOD A2C: 28.8 ml LV vol s, MOD A4C: 42.2 ml LV SV MOD A2C:     61.6 ml LV SV MOD A4C:     82.7 ml LV SV MOD BP:      53.5 ml RIGHT VENTRICLE             IVC RV S prime:     19.00 cm/s  IVC diam: 2.30 cm TAPSE  (M-mode): 2.6 cm LEFT ATRIUM           Index        RIGHT ATRIUM           Index LA diam:      2.90 cm 1.88 cm/m   RA Area:     11.00 cm LA Vol (A2C): 18.6 ml 12.07 ml/m  RA Volume:   20.50 ml  13.30 ml/m LA Vol (A4C): 22.7 ml 14.73 ml/m  AORTIC VALVE LVOT Vmax:   118.00 cm/s LVOT Vmean:  75.800 cm/s LVOT VTI:    0.221 m  AORTA Ao Root diam: 2.70 cm MITRAL VALVE MV Area (PHT): 4.06 cm    SHUNTS MV Decel Time: 187 msec    Systemic VTI:  0.22 m MV E velocity: 69.70 cm/s  Systemic Diam: 1.90 cm MV A velocity: 66.50 cm/s MV E/A ratio:  1.05 [name] MD Electronically signed by [name] MD Signature Date/Time: 03/16/2024/1:02:55 PM    Final    CT Angio Chest PE W and/or Wo Contrast Result Date: 03/16/2024 CLINICAL DATA:  Heart palpitations, awakened the patient from sleep this morning. EXAM: CT ANGIOGRAPHY CHEST WITH CONTRAST TECHNIQUE: Multidetector CT imaging of the chest was performed using the standard protocol during bolus administration of intravenous contrast. Multiplanar CT image reconstructions and MIPs were obtained to evaluate the vascular anatomy. RADIATION DOSE REDUCTION: This exam was performed according to the departmental dose-optimization program which includes automated exposure control, adjustment of the mA and/or kV according to patient size and/or use of iterative reconstruction technique. CONTRAST:  75mL OMNIPAQUE  IOHEXOL  350 MG/ML SOLN COMPARISON:  AP Lat chest today, portable chest 07/12/2022, and cardiac partial chest CT with contrast 09/19/2022. 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 Moderate pericardial fluid is seen new from 09/19/2022, maximum thickness 2 cm. The fluid is low in density, 18 Hounsfield units and not suspicious for pericardial hemorrhage. The pulmonary arteries are well opacified, normal caliber and clear through the segmental divisions. The subsegmental arteries are not well seen  due to respiratory motion. They're normal where visible. The aorta and great vessels are normal. The pulmonary veins are normal in caliber. There is mild cardiomegaly, seen previously. No visible coronary calcification. Mediastinum/Nodes: No enlarged mediastinal, hilar, or axillary lymph nodes. The lower poles of the thyroid  gland, trachea, and esophagus demonstrate no significant findings. Lungs/Pleura: There is diffuse bronchial thickening. There is increased left lower lobe infrahilar opacity concerning for pneumonia or aspiration. The central airways are clear. Mild chronic elevation right hemidiaphragm. Linear scar-like opacities symmetrically bright apex, outer right upper lobe. Again noted is  a 3 mm subpleural right middle lobe solid nodule on 6:56, and 2 adjacent 4 mm right lower lobe superior segment nodules on 6:65, all unchanged. No follow-up imaging is recommended. Benign process given length of stability. The lungs are otherwise clear. Upper Abdomen: No acute abnormality. Musculoskeletal: Slight thoracic levoscoliosis. No acute or significant osseous findings. No chest wall mass is seen. Review of the MIP images confirms the above findings. IMPRESSION: 1. No evidence of arterial dilatation or embolus through the segmental divisions. The subsegmental arteries are not well seen due to respiratory motion. 2. Moderate pericardial effusion, new from 09/19/2022. 3. 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 4. Diffuse bronchial thickening consistent with bronchitis or reactive airways disease. 5. Increased left lower lobe infrahilar opacity concerning for pneumonia or aspiration. Electronically Signed   By: Francis Quam M.D.   On: 03/16/2024 07:22   DG Chest 2 View Result Date: 03/16/2024 CLINICAL DATA:  Heart palpitations and chest discomfort. EXAM: CHEST - 2 VIEW COMPARISON:  AP chest portable 07/12/2022. FINDINGS: There is mild cardiomegaly. No vascular congestion is seen. No  edema or pleural collections. The lungs are clear of infiltrates. The mediastinum is normally outlined. Thoracic cage is intact with slight thoracic levoscoliosis. Multiple superimposed telemetry leads. IMPRESSION: Mild cardiomegaly. No acute chest findings. Electronically Signed   By: Francis Quam M.D.   On: 03/16/2024 03:02    Cardiac Studies   See echo  Assessment   Principal Problem:   Pericardial effusion Active Problems:   Sclerodactyly   Vitamin D deficiency   Systemic sclerosis (HCC)   Hypokalemia   Hypomagnesemia   Bronchitis   Plan   Stable overnight- no change in vitals. No active inflammation based on labwork. Will repeat limited echo tomorrow to look at pericardial effusion. Suspect she will need RHC this week and possible pericardiocentesis.  Time Spent Directly with Patient:  I have spent a total of 25 minutes with the patient reviewing hospital notes, telemetry, EKGs, labs and examining the patient as well as establishing an assessment and plan that was discussed personally with the patient.  > 50% of time was spent in direct patient care.  Length of Stay:  LOS: 1 day   Vinie KYM Maxcy, MD, [facility], FNLA, FACP  [place]  [facility]  Medical Director of the [facility]  [facility] Diplomate of the American Board of Clinical Lipidology Attending Cardiologist  Direct Dial: [phone]  Fax: [phone]  Website:  [email or web address] Vinie BROCKS Laurabelle Gorczyca 03/17/2024, 11:49 AM

## 2024-03-17 NOTE — Evaluation (Signed)
 Physical Therapy Brief Evaluation and Discharge Note Patient Details Name: Erin Higgins MRN: [id number] DOB: May 16, 1971 Today's Date: 03/17/2024   History of Present Illness  Pt is a 53 y.o. female admitted 9/20 for heart palpitations. CT showed mod pericardial effusion  PMH: systemic sclerosis, scleroderma, RA, Raynaud's disease  Clinical Impression  PTA, patient lives with family, independent with ambulation. Reports limitations in UE secondary to symptoms of scleroderma. Presents today with 3+/5 MMT globally at Bilateral hips. Currently completes bed mobility and transfers independently. Ambulated 200 feet independently but demonstrated reduced gait speed. Discussed benefits of using 4WW however, equipment not available during evaluation. Patient is currently at her baseline level of mobility. No further acute PT needs at this time. Recommend return home with PRN assist. She reports she is scheduled to start OPPT in October.       PT Assessment Patient does not need any further PT services  Assistance Needed at Discharge  PRN    Equipment Recommendations Rollator (4 wheels) (For energy conservation as patient is active but limited by symptoms of scleroderma)  Recommendations for Other Services       Precautions/Restrictions Precautions Precautions: Fall Recall of Precautions/Restrictions: Intact Restrictions Weight Bearing Restrictions Per Provider Order: No        Mobility  Bed Mobility Rolling: Independent Supine/Sidelying to sit: Independent Sit to supine/sidelying: Independent    Transfers Overall transfer level: Independent Equipment used: None               General transfer comment: Independent for ambulation without AD. Reduced gait speed. Would benefit from 4WW however, equipment not present. Reports using with OT earlier today.    Ambulation/Gait Ambulation/Gait assistance: Independent Gait Distance (Feet): 200 Feet Assistive device: None Gait  Pattern/deviations: Step-through pattern, Decreased stride length Gait Speed: Below normal    Home Activity Instructions    Stairs Stairs:  (Patient completed stairs earlier during OT evaluation.)          Modified Rankin (Stroke Patients Only)        Balance Overall balance assessment: No apparent balance deficits (not formally assessed)                        Pertinent Vitals/Pain PT - Brief Vital Signs All Vital Signs Stable: Yes Pain Assessment Pain Assessment: No/denies pain     Home Living Family/patient expects to be discharged to:: Private residence Living Arrangements: Children;Parent (36 yo and 44 yo son and mother) Available Help at Discharge: Family              Prior Function Level of Independence: Independent      UE/LE Assessment        LE ROM/Strength/Tone/Coordination: WFL (proximal weakness present at B hips (3+/5 MMT))      Communication   Communication Communication: No apparent difficulties     Cognition Overall Cognitive Status: Appears within functional limits for tasks assessed/performed       General Comments      Exercises     Assessment/Plan    PT Problem List         PT Visit Diagnosis Muscle weakness (generalized) (M62.81)    No Skilled PT Patient is independent with all acitivity/mobility   Co-evaluation                AMPAC 6 Clicks Help needed turning from your back to your side while in a flat bed without using bedrails?: None Help needed moving from lying  on your back to sitting on the side of a flat bed without using bedrails?: None Help needed moving to and from a bed to a chair (including a wheelchair)?: None Help needed standing up from a chair using your arms (e.g., wheelchair or bedside chair)?: None Help needed to walk in hospital room?: None Help needed climbing 3-5 steps with a railing? : A Little 6 Click Score: 23      End of Session Equipment Utilized During  Treatment: Gait belt Activity Tolerance: Patient tolerated treatment well Patient left: in bed;with family/visitor present;with call bell/phone within reach   PT Visit Diagnosis: Muscle weakness (generalized) (M62.81)     Time: 8586-8566 PT Time Calculation (min) (ACUTE ONLY): 20 min  Charges:   PT Evaluation $PT Eval Low Complexity: 1 Low      Sherryle [place], PT, DPT [facility] Acute Rehabilitation Office: [phone]   Sherryle VEAR [place]  03/17/2024, 2:47 PM

## 2024-03-17 NOTE — Evaluation (Signed)
 Occupational Therapy Evaluation & Discharge Patient Details Name: Erin Higgins MRN: [id number] DOB: August 21, 1970 Today's Date: 03/17/2024   History of Present Illness   Pt is a 53 y.o. female admitted 9/20 for heart palpitations. CT showed mod pericardial effusion  PMH: systemic sclerosis, scleroderma, RA, Raynaud's disease     Clinical Impressions Pt admitted based on above, and was seen based on problem list below. PTA pt was living with family members who were providing prn assistance with ADLs d/t baseline decreased Bil shoulder AROM. Today pt is at her baseline of min assist for UB/LB dressing. Pt ambulating 255ft in hallway and negotiating stairs at mod I. Pt requesting to trial rollator for community use. With cues  for safety features pt supervision with rollator for mobility, recommend for community use to promote energy conservation. Educated pt on use of shower chair and BSC as well for energy conservation. Pt reporting today she is at her baseline for ADLs and mobility, no further acute OT needs. Recommended pt continue current outpatient OT/PT, OT is signing off on this pt.      If plan is discharge home, recommend the following:   Assistance with cooking/housework;A little help with bathing/dressing/bathroom     Functional Status Assessment   Patient has not had a recent decline in their functional status     Equipment Recommendations   BSC/3in1;Tub/shower seat;Other (comment) (Rollator)      Precautions/Restrictions   Precautions Precautions: Fall Recall of Precautions/Restrictions: Intact Restrictions Weight Bearing Restrictions Per Provider Order: No     Mobility Bed Mobility Overal bed mobility: Modified Independent      Transfers Overall transfer level: Modified independent Equipment used: None, Rollator (4 wheels)     General transfer comment: Pt at mod I for mobility without AD, pt trialed use of rollator, supervison for cues, navigated 2  steps to enter home step-to pattern      Balance Overall balance assessment: Mild deficits observed, not formally tested       ADL either performed or assessed with clinical judgement   ADL Overall ADL's : At baseline       General ADL Comments: Pt at baseline needing min assist for UB and LB dressing, completed toilet transfer and hygiene at mod I to supervision     Vision Baseline Vision/History: 0 No visual deficits Patient Visual Report: No change from baseline Vision Assessment?: No apparent visual deficits            Pertinent Vitals/Pain Pain Assessment Pain Assessment: No/denies pain     Extremity/Trunk Assessment Upper Extremity Assessment Upper Extremity Assessment: Generalized weakness;RUE deficits/detail;LUE deficits/detail RUE Deficits / Details: Limited shoulder flex & abduction ~70 degrees, decreased digit AROM worse radial side >ulnar RUE Sensation: WNL RUE Coordination: decreased fine motor LUE Deficits / Details: Limited shoulder flex & abduction ~70 degrees, decreased digit AROM worse radial side >ulnar LUE Sensation: WNL LUE Coordination: decreased fine motor   Lower Extremity Assessment Lower Extremity Assessment: Generalized weakness   Cervical / Trunk Assessment Cervical / Trunk Assessment: Normal   Communication Communication Communication: No apparent difficulties   Cognition Arousal: Alert Behavior During Therapy: WFL for tasks assessed/performed Cognition: No apparent impairments       Following commands: Intact       Cueing  General Comments   Cueing Techniques: Verbal cues  VSS on RA, max HR during session 95 bpm           Home Living Family/patient expects to be discharged to:: Private residence  Living Arrangements: Children;Parent Available Help at Discharge: Family Type of Home: House Home Access: Stairs to enter Secretary/administrator of Steps: 2 Entrance Stairs-Rails: None Home Layout: One level      Bathroom Shower/Tub: Producer, television/film/video: Handicapped height Bathroom Accessibility: Yes How Accessible: Accessible via walker Home Equipment: Toilet riser;Grab bars - tub/shower;Grab bars - toilet          Prior Functioning/Environment Prior Level of Function : Needs assist     Mobility Comments: no AD, completing OPPT ADLs Comments: Prn assist with dressing and bathing. Completing OPOT    OT Problem List: Cardiopulmonary status limiting activity;Impaired UE functional use        OT Goals(Current goals can be found in the care plan section)   Acute Rehab OT Goals Patient Stated Goal: To go home OT Goal Formulation: With patient Time For Goal Achievement: 03/31/24 Potential to Achieve Goals: Good   AM-PAC OT 6 Clicks Daily Activity     Outcome Measure Help from another person eating meals?: None Help from another person taking care of personal grooming?: A Little Help from another person toileting, which includes using toliet, bedpan, or urinal?: A Little Help from another person bathing (including washing, rinsing, drying)?: A Little Help from another person to put on and taking off regular upper body clothing?: A Little Help from another person to put on and taking off regular lower body clothing?: A Little 6 Click Score: 19   End of Session Equipment Utilized During Treatment: Rollator (4 wheels) Nurse Communication: Mobility status  Activity Tolerance: Patient tolerated treatment well Patient left: in bed;with call bell/phone within reach  OT Visit Diagnosis: Muscle weakness (generalized) (M62.81)                Time: 9058-8989 OT Time Calculation (min): 29 min Charges:  OT General Charges $OT Visit: 1 Visit OT Evaluation $OT Eval Moderate Complexity: 1 Mod OT Treatments $Self Care/Home Management : 8-22 mins  Adrianne BROCKS, OT  Acute Rehabilitation Services Office [phone] Secure chat preferred   Adrianne GORMAN [place] 03/17/2024, 10:35  AM

## 2024-03-18 ENCOUNTER — Inpatient Hospital Stay (HOSPITAL_COMMUNITY)

## 2024-03-18 DIAGNOSIS — I3139 Other pericardial effusion (noninflammatory): Secondary | ICD-10-CM

## 2024-03-18 LAB — ECHOCARDIOGRAM LIMITED
Height: 65 in
S' Lateral: 3 cm
Single Plane A4C EF: 61.1 %
Weight: 1782.4 [oz_av]

## 2024-03-18 LAB — BASIC METABOLIC PANEL WITH GFR
Anion gap: 7 (ref 5–15)
BUN: 10 mg/dL (ref 6–20)
CO2: 22 mmol/L (ref 22–32)
Calcium: 8.8 mg/dL — ABNORMAL LOW (ref 8.9–10.3)
Chloride: 106 mmol/L (ref 98–111)
Creatinine, Ser: 0.42 mg/dL — ABNORMAL LOW (ref 0.44–1.00)
GFR, Estimated: >60 mL/min (ref >60)
Glucose, Bld: 94 mg/dL (ref 70–99)
Potassium: 3.8 mmol/L (ref 3.5–5.1)
Sodium: 135 mmol/L (ref 135–145)

## 2024-03-18 LAB — CBC
HCT: 29.8 % — ABNORMAL LOW (ref 36.0–46.0)
Hemoglobin: 9.3 g/dL — ABNORMAL LOW (ref 12.0–15.0)
MCH: 27.8 pg (ref 26.0–34.0)
MCHC: 31.2 g/dL (ref 30.0–36.0)
MCV: 89 fL (ref 80.0–100.0)
Platelets: 296 K/uL (ref 150–400)
RBC: 3.35 MIL/uL — ABNORMAL LOW (ref 3.87–5.11)
RDW: 15.9 % — ABNORMAL HIGH (ref 11.5–15.5)
WBC: 4.8 K/uL (ref 4.0–10.5)
nRBC: 0.4 % — ABNORMAL HIGH (ref 0.0–0.2)

## 2024-03-18 NOTE — Progress Notes (Signed)
   Discussed echo findings with Erin Higgins.  There does appear to be a small increase in her pericardial effusion which was large ready.  The heart is freely swinging without any significant variation in mitral and tricuspid inflow variation.  The LVEF is increased and there is collapse of the RV with a dilated IVC that does not collapse.  These findings partially support echocardiographic features of tamponade however she does not have any apparent clinical tamponade.  After talking with her family, was informed that she felt that her disease could potentially be in remission and that she would start to see some improvement in her skin depigmentation.  She was not thought to have CREST syndrome, however that was earlier this year.  I think it is most likely that her pericardial effusion is related to systemic sclerosis.  Ultimately the effusion will likely progress and require drainage more urgently.  Based on that if there is some evidence of tamponade or constriction, then I think we should consider a pericardiocentesis.  I discussed the right heart catheterization procedure and pericardiocentesis with her today and she is agreeable to both procedures if necessary.  Will plan to schedule for right heart catheterization tomorrow, understanding that if she is found to have severe pulmonary hypertension it may be contraindicated to consider pericardiocentesis due to the risk of circulatory collapse.  Ultimately she may require pericardial window.  Informed Consent   Shared Decision Making/Informed Consent The risks, including but not limited to, [bleeding or vascular complications (1 in 500), pneumothorax (1 in 1600), arrhythmia (1 in 1000) and death (1 in 5000)], benefits (diagnostic support and/or management of heart failure, pulmonary hypertension) and alternatives of a right heart catheterization were discussed in detail with [name] and she is willing to proceed. The risks [bleeding, infection,  cardiac arrest, damage to the heart or blood vessels requiring surgery, pneumothorax requiring chest tube placement, arrhythmias, changes in blood pressure, organ injury, and rarely death], benefits [therapeutic relief of fluid collection around the heart, diagnostic support], and alternatives of a pericardiocentesis were discussed in detail with [name] and she agrees to proceed.    Vinie KYM Maxcy, MD, [facility], FNLA, FACP  [place]  [facility]  Medical Director of the [facility]  Cardiovascular Risk Reduction Clinic Diplomate of the American Board of Clinical Lipidology Attending Cardiologist  Direct Dial: [phone]  Fax: [phone]  Website:  [email or web address]

## 2024-03-18 NOTE — Progress Notes (Signed)
 Progress Note  Patient Name: Erin Higgins Date of Encounter: 03/18/2024  Primary Cardiologist: Vishnu P Mallipeddi, MD  Subjective   Denies any CP, SOB, palpitations. Occasionally coughing up phlegm.  Inpatient Medications    Scheduled Meds:  amLODipine   2.5 mg Oral Daily   azithromycin   500 mg Oral Daily   famotidine   20 mg Oral QHS   LORazepam   1 mg Intravenous Once   [START ON 03/22/2024] methotrexate   20 mg Oral Weekly   sodium chloride  flush  3 mL Intravenous Q12H   sodium chloride  flush  3 mL Intravenous Q12H   Continuous Infusions:  PRN Meds: acetaminophen  **OR** acetaminophen , bisacodyl , hydrALAZINE , HYDROmorphone  (DILAUDID ) injection, ipratropium, levalbuterol , ondansetron  **OR** ondansetron  (ZOFRAN ) IV, oxyCODONE , senna-docusate, sodium phosphate, traZODone    Vital Signs    Vitals:   03/17/24 2100 03/18/24 0015 03/18/24 0441 03/18/24 0734  BP: 128/82 120/75 114/70 98/71  Pulse: 81 76 73 75  Resp: 18 18 18    Temp: 98.4 F (36.9 C) 98.2 F (36.8 C) 98 F (36.7 C) 98.1 F (36.7 C)  TempSrc: Oral Oral Oral Oral  SpO2: 100% 99% 100% 98%  Weight:   50.5 kg   Height:       No intake or output data in the 24 hours ending 03/18/24 0847    03/18/2024    4:41 AM 03/17/2024    2:57 AM 03/16/2024    4:43 PM  Last 3 Weights  Weight (lbs) 111 lb 6.4 oz 111 lb 1.6 oz 110 lb 11.2 oz  Weight (kg) 50.531 kg 50.395 kg 50.213 kg     Telemetry    NSR - Personally Reviewed  Physical Exam   GEN: No acute distress.  HEENT: Normocephalic, atraumatic, sclera non-icteric. Neck: No JVD or bruits. Cardiac: RRR no murmurs, rubs, or gallops.  Respiratory: Clear to auscultation bilaterally. Breathing is unlabored. GI: Soft, nontender, non-distended, BS +x 4. MS: no deformity. Extremities: No clubbing or cyanosis. No edema. Distal pedal pulses are 2+ and equal bilaterally. Vitiligo type pigmentation changes scattered throughout. Neuro:  AAOx3. Follows commands. Psych:   Responds to questions appropriately with a normal affect.  Labs    High Sensitivity Troponin:   Recent Labs  Lab 03/16/24 0332 03/16/24 0510  TROPONINIHS 56* 56*      Cardiac EnzymesNo results for input(s): TROPONINI in the last 168 hours. No results for input(s): TROPIPOC in the last 168 hours.   Chemistry Recent Labs  Lab 03/16/24 0332 03/17/24 0334 03/18/24 0644  NA 140 139 135  K 3.2* 3.7 3.8  CL 110 109 106  CO2 23 22 22   GLUCOSE 97 115* 94  BUN 13 10 10   CREATININE 0.40* 0.42* 0.42*  CALCIUM 8.3* 8.5* 8.8*  PROT 6.8  --   --   ALBUMIN 3.0*  --   --   AST 23  --   --   ALT 12  --   --   ALKPHOS 46  --   --   BILITOT 0.8  --   --   GFRNONAA >60 >60 >60  ANIONGAP 7 8 7      Hematology Recent Labs  Lab 03/16/24 0332 03/17/24 0334 03/18/24 0644  WBC 4.8 4.6 4.8  RBC 3.48* 3.31* 3.35*  HGB 9.8* 9.3* 9.3*  HCT 31.5* 29.2* 29.8*  MCV 90.5 88.2 89.0  MCH 28.2 28.1 27.8  MCHC 31.1 31.8 31.2  RDW 16.1* 16.0* 15.9*  PLT 252 265 296    BNPNo results for input(s): BNP, PROBNP  in the last 168 hours.   DDimer No results for input(s): DDIMER in the last 168 hours.   Radiology    ECHOCARDIOGRAM COMPLETE Result Date: 03/16/2024    ECHOCARDIOGRAM REPORT   Patient Name:   YANAI HOBSON Date of Exam: 03/16/2024 Medical Rec #:  [id number]         Height:       65.0 in Accession #:    [id number]        Weight:       111.2 lb Date of Birth:  [birth year]          BSA:          1.541 m Patient Age:    53 years          BP:           117/101 mmHg Patient Gender: F                 HR:           81 bpm. Exam Location:  [facility] Procedure: 2D Echo, Cardiac Doppler and Color Doppler (Both Spectral and Color            Flow Doppler were utilized during procedure). Indications:    I31.3 Pericardial effusion (noninflammatory)  History:        Patient has prior history of Echocardiogram examinations, most                 recent 02/16/2023. Pericardial effusion.  Sonographer:     Ellouise Mose RDCS Referring Phys: [id number] SEYED A SHAHMEHDI  Sonographer Comments: Technically difficult study due to poor echo windows. IMPRESSIONS  1. Left ventricular ejection fraction, by estimation, is 60 to 65%. The left ventricle has normal function. The left ventricle has no regional wall motion abnormalities. Left ventricular diastolic parameters were normal.  2. Right ventricular systolic function is normal. The right ventricular size is normal. Tricuspid regurgitation signal is inadequate for assessing PA pressure.  3. Large pericardial effusion, without diastolic collapse of the RV, RA, or variation of septal motion. There is no respirophasic variation of the mitral valve velocities. 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 . Large pericardial effusion. The pericardial effusion is circumferential.  4. The mitral valve is normal in structure. No evidence of mitral valve regurgitation. No evidence of mitral stenosis.  5. The aortic valve is tricuspid. Aortic valve regurgitation is not visualized.  6. The inferior vena cava is dilated in size with <50% respiratory variability, suggesting right atrial pressure of 15 mmHg. Comparison(s): Prior images reviewed side by side. Pericardial effusion has increased in size. Consider repeating echocardiogram limited next week or if change in clinical situation. FINDINGS  Left Ventricle: Left ventricular ejection fraction, by estimation, is 60 to 65%. The left ventricle has normal function. The left ventricle has no regional wall motion abnormalities. The left ventricular internal cavity size was normal in size. There is  no left ventricular hypertrophy. Left ventricular diastolic parameters were normal. Right Ventricle: The right ventricular size is normal. No increase in right ventricular wall thickness. Right ventricular systolic function is normal. Tricuspid regurgitation signal is inadequate for assessing PA pressure. Left  Atrium: Left atrial size was normal in size. Right Atrium: Right atrial size was normal in size. Pericardium: Large pericardial effusion, without diastolic collapse of the RV, RA, or variation of septal motion. There is no respirophasic variation of the mitral valve velocities. 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 A large pericardial effusion is present. The pericardial effusion is circumferential. Mitral Valve: The mitral valve is normal in structure. No evidence of mitral valve regurgitation. No evidence of mitral valve stenosis. Tricuspid Valve: The tricuspid valve is normal in structure. Tricuspid valve regurgitation is not demonstrated. No evidence of tricuspid stenosis. Aortic Valve: The aortic valve is tricuspid. Aortic valve regurgitation is not visualized. Pulmonic Valve: The pulmonic valve was normal in structure. Pulmonic valve regurgitation is trivial. No evidence of pulmonic stenosis. Aorta: The aortic root is normal in size and structure. Venous: The inferior vena cava is dilated in size with less than 50% respiratory variability, suggesting right atrial pressure of 15 mmHg. IAS/Shunts: No atrial level shunt detected by color flow Doppler.  LEFT VENTRICLE PLAX 2D LVIDd:         4.60 cm     Diastology LVIDs:         2.83 cm     LV e' medial:    8.70 cm/s LV PW:         0.90 cm     LV E/e' medial:  8.0 LV IVS:        1.00 cm     LV e' lateral:   8.86 cm/s LVOT diam:     1.90 cm     LV E/e' lateral: 7.9 LV SV:         63 LV SV Index:   41 LVOT Area:     2.84 cm  LV Volumes (MOD) LV vol d, MOD A2C: 90.4 ml LV vol d, MOD A4C: 82.7 ml LV vol s, MOD A2C: 28.8 ml LV vol s, MOD A4C: 42.2 ml LV SV MOD A2C:     61.6 ml LV SV MOD A4C:     82.7 ml LV SV MOD BP:      53.5 ml RIGHT VENTRICLE             IVC RV S prime:     19.00 cm/s  IVC diam: 2.30 cm TAPSE (M-mode): 2.6 cm LEFT ATRIUM           Index        RIGHT ATRIUM           Index LA diam:      2.90 cm 1.88 cm/m   RA  Area:     11.00 cm LA Vol (A2C): 18.6 ml 12.07 ml/m  RA Volume:   20.50 ml  13.30 ml/m LA Vol (A4C): 22.7 ml 14.73 ml/m  AORTIC VALVE LVOT Vmax:   118.00 cm/s LVOT Vmean:  75.800 cm/s LVOT VTI:    0.221 m  AORTA Ao Root diam: 2.70 cm MITRAL VALVE MV Area (PHT): 4.06 cm    SHUNTS MV Decel Time: 187 msec    Systemic VTI:  0.22 m MV E velocity: 69.70 cm/s  Systemic Diam: 1.90 cm MV A velocity: 66.50 cm/s MV E/A ratio:  1.05 [name] MD Electronically signed by [name] MD Signature Date/Time: 03/16/2024/1:02:55 PM    Final     Cardiac Studies   2d echo 03/16/24  1. Left ventricular ejection fraction, by estimation, is 60 to 65%. The  left ventricle has normal function. The left ventricle has no regional  wall motion abnormalities. Left ventricular diastolic parameters were  normal.   2. Right ventricular systolic function is normal. The right ventricular  size is normal. Tricuspid regurgitation signal is inadequate for assessing  PA pressure.   3.  Large pericardial effusion, without diastolic collapse of the RV, RA,  or variation of septal motion. There is no respirophasic variation of the  mitral valve velocities. 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 .  Large pericardial effusion. The pericardial effusion is circumferential.   4. The mitral valve is normal in structure. No evidence of mitral valve  regurgitation. No evidence of mitral stenosis.   5. The aortic valve is tricuspid. Aortic valve regurgitation is not  visualized.   6. The inferior vena cava is dilated in size with <50% respiratory  variability, suggesting right atrial pressure of 15 mmHg.   Comparison(s): Prior images reviewed side by side. Pericardial effusion  has increased in size. Consider repeating echocardiogram limited next week  or if change in clinical situation.   Patient Profile     53 y.o. female with  systemic sclerosis, scleroderma on  methotrexate , hypertension, vitamin D deficiency, Raynaud's phenomenon who presented to [facility] with palpitations, started on IVF. CTA negative for PE but showed moderate pericardial effusion and increased LLL opacity concerning for PNA/aspiration.  Assessment & Plan    1. Large pericardial effusion - CRP, ESR, pro-cal WNL - 2D echo 03/16/24 EF 60-65%, normal RV, large pericardial effusion with IVC plethora but no other findings of increased pericardial pressure  - ?whether needs quantiferon testing - limited echo pending today to guide next steps, question of possible RHC/pericardiocentesis - await MD input regarding anti-inflammatory regimen  2. Palpitations - improved, no clear associated arrhythmia - check thyroid  with AM labs  3. Elevated troponin - low/flat felt related to pericarditis/pericardial effusion (cor CTA 08/2022 with normal coronaries so doubt progression of CAD)  4. H/o systemic sclerosis, scleroderma, sclerodactyly and Raynaud's - mgmt per IM - otherwise on low dose amlodipine   5. Bronchitis - on abx for this, CTA also ? LLL opacity  6. Acute on chronic anemia - per primary team   For questions or updates, please contact [facility] Please consult [email or web address] for contact info under Cardiology/STEMI.  Signed, Raphael LOISE Bring, PA-C 03/18/2024, 8:47 AM

## 2024-03-18 NOTE — Progress Notes (Signed)
  Progress Note   Patient: Erin Higgins FMW:[id number] DOB: 02/04/1971 DOA: 03/16/2024     2 DOS: the patient was seen and examined on 03/18/2024   Brief hospital course: 53 year old female with a history of systemic sclerosis, scleroderma on methotrexate , HTN, vitamin vitamin D deficiency-presented to ED with chief complaint of palpitation, which improved with IV fluid hydration, supplemental oxygen.  Denied any chest pain, shortness of breath, or diaphoresis.   ED evaluation: Blood pressure (!) 117/101, pulse 85, temperature 98.1 F (36.7 C), temperature source Oral, resp. rate 20, SpO2 98%.   LABs: Hemoglobin 9.8, potassium 3.2, calcium 8.3, magnesium  1.7 Troponin 56, 56,   ZXH:Yzjmu rate 94, QTc 351, ventricular bigeminy is no obvious ST elevation or depressio     Chest x-ray mild cardiomegaly no acute changes CT angio: Moderate pericardial effusion, new from 09/19/2022. 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 Lonni Ester as outpt   Sclerodactyly Sclerodactyly daily with RA Stable - Continue home medication including methotrexate  q. weekly   Vitamin D deficiency - Continue vitamin D supplements   Bronchitis Continue azithromycin  -for presumed bronchitis - procal<0.10 -Patient is afebrile, normotensive, with no leukocytosis   Hypomagnesemia -Goal Mg  >2 -Cont to correct as needed   Hypokalemia - cont to replace K as needed   Subjective: Requesting change back to a regular diet given dietary restrictions that come with a dys 3 diet  Physical Exam: Vitals:   03/18/24 0015 03/18/24 0441 03/18/24 0734 03/18/24 1558  BP: 120/75 114/70 98/71 124/72  Pulse: 76 73 75 72  Resp: 18 18  15   Temp: 98.2 F (36.8 C) 98 F (36.7 C) 98.1 F (36.7 C) 98.2 F (36.8 C)  TempSrc: Oral Oral Oral Oral  SpO2: 99% 100% 98% 98%  Weight:  50.5 kg    Height:       General exam: Conversant, in no acute distress Respiratory system: normal chest rise, clear, no audible wheezing Cardiovascular system: regular rhythm, s1-s2 Gastrointestinal system: Nondistended, nontender, pos BS Central nervous system: No seizures, no tremors Extremities: No cyanosis, no joint deformities Skin: No rashes, no pallor Psychiatry: Affect normal // no auditory hallucinations   Data Reviewed:  Labs reviewed: Na 135, K 3.8, Cr 0.42, WBC 4.8, Hgb 9.3, Plts 296  Family Communication: Pt in room, family at bedside  Disposition: Status is: Inpatient Remains inpatient appropriate because: severity of illness  Planned Discharge Destination: Home    Author: Garnette Pelt, MD 03/18/2024 4:30 PM  For on call review [email or web address].

## 2024-03-18 NOTE — Progress Notes (Signed)
 Asked cardmaster to assist with scheduling RHC with possible pericardiocentesis. Ordered consent and NPO after MN except sips with meds.

## 2024-03-18 NOTE — Plan of Care (Signed)

## 2024-03-18 NOTE — Progress Notes (Signed)
   03/18/24 1105  Spiritual Encounters  Type of Visit Initial  Care provided to: Pt and family  Referral source Clinical staff  Reason for visit Advance directives  OnCall Visit No  Spiritual Framework  Presenting Themes Impactful experiences and emotions  Community/Connection Family  Patient Stress Factors Health changes  Family Stress Factors Health changes  Interventions  Spiritual Care Interventions Made Compassionate presence;Established relationship of care and support  Intervention Outcomes  Outcomes Connection to spiritual care;Awareness of support;Awareness of health;Reduced anxiety  Mental Health Advance Directives  Would patient like information on creating a mental health advance directive? No - Patient declined   Chaplain visited Pt to discuss Advance Directive (AD). PT stated that her mother is designated as Writer.   Pt requested prayer; chaplain offered prayer and scripture   I will never leave you nor forsake you per the Pt and family request.Spiritual and emotional support provide. Chaplain services remain available for ongoing support.

## 2024-03-19 ENCOUNTER — Encounter (HOSPITAL_COMMUNITY)
Admission: EM | Disposition: A | Discharge status: Home / Self Care | Payer: Self-pay | Source: Home / Self Care | Attending: Internal Medicine

## 2024-03-19 ENCOUNTER — Inpatient Hospital Stay (HOSPITAL_COMMUNITY)

## 2024-03-19 DIAGNOSIS — I3139 Other pericardial effusion (noninflammatory): Secondary | ICD-10-CM | POA: Diagnosis not present

## 2024-03-19 DIAGNOSIS — I270 Primary pulmonary hypertension: Secondary | ICD-10-CM

## 2024-03-19 HISTORY — PX: PERICARDIOCENTESIS: CATH118255

## 2024-03-19 HISTORY — PX: RIGHT HEART CATH: CATH118263

## 2024-03-19 LAB — BODY FLUID CELL COUNT WITH DIFFERENTIAL
Eos, Fluid: 1 %
Lymphs, Fluid: 25 %
Monocyte-Macrophage-Serous Fluid: 73 % (ref 50–90)
Neutrophil Count, Fluid: 1 % (ref 0–25)
Total Nucleated Cell Count, Fluid: 17 uL (ref 0–1000)

## 2024-03-19 LAB — POCT I-STAT EG7
Acid-Base Excess: 0 mmol/L (ref 0.0–2.0)
Acid-Base Excess: 0 mmol/L (ref 0.0–2.0)
Acid-Base Excess: 0 mmol/L (ref 0.0–2.0)
Bicarbonate: 24.9 mmol/L (ref 20.0–28.0)
Bicarbonate: 25.6 mmol/L (ref 20.0–28.0)
Bicarbonate: 25.7 mmol/L (ref 20.0–28.0)
Calcium, Ion: 1.26 mmol/L (ref 1.15–1.40)
Calcium, Ion: 1.28 mmol/L (ref 1.15–1.40)
Calcium, Ion: 1.29 mmol/L (ref 1.15–1.40)
HCT: 32 % — ABNORMAL LOW (ref 36.0–46.0)
HCT: 33 % — ABNORMAL LOW (ref 36.0–46.0)
HCT: 33 % — ABNORMAL LOW (ref 36.0–46.0)
Hemoglobin: 10.9 g/dL — ABNORMAL LOW (ref 12.0–15.0)
Hemoglobin: 11.2 g/dL — ABNORMAL LOW (ref 12.0–15.0)
Hemoglobin: 11.2 g/dL — ABNORMAL LOW (ref 12.0–15.0)
O2 Saturation: 75 %
O2 Saturation: 76 %
O2 Saturation: 77 %
Potassium: 3.7 mmol/L (ref 3.5–5.1)
Potassium: 3.7 mmol/L (ref 3.5–5.1)
Potassium: 3.7 mmol/L (ref 3.5–5.1)
Sodium: 141 mmol/L (ref 135–145)
Sodium: 141 mmol/L (ref 135–145)
Sodium: 141 mmol/L (ref 135–145)
TCO2: 26 mmol/L (ref 22–32)
TCO2: 27 mmol/L (ref 22–32)
TCO2: 27 mmol/L (ref 22–32)
pCO2, Ven: 42.9 mmHg — ABNORMAL LOW (ref 44–60)
pCO2, Ven: 44.1 mmHg (ref 44–60)
pCO2, Ven: 45 mmHg (ref 44–60)
pH, Ven: 7.364 (ref 7.25–7.43)
pH, Ven: 7.372 (ref 7.25–7.43)
pH, Ven: 7.374 (ref 7.25–7.43)
pO2, Ven: 41 mmHg (ref 32–45)
pO2, Ven: 42 mmHg (ref 32–45)
pO2, Ven: 43 mmHg (ref 32–45)

## 2024-03-19 LAB — ECHOCARDIOGRAM LIMITED
Height: 65 in
Weight: 1756.8 [oz_av]

## 2024-03-19 LAB — PROTEIN, PLEURAL OR PERITONEAL FLUID: Total protein, fluid: 5 g/dL

## 2024-03-19 LAB — CBC
HCT: 31.4 % — ABNORMAL LOW (ref 36.0–46.0)
Hemoglobin: 10 g/dL — ABNORMAL LOW (ref 12.0–15.0)
MCH: 27.9 pg (ref 26.0–34.0)
MCHC: 31.8 g/dL (ref 30.0–36.0)
MCV: 87.7 fL (ref 80.0–100.0)
Platelets: 296 K/uL (ref 150–400)
RBC: 3.58 MIL/uL — ABNORMAL LOW (ref 3.87–5.11)
RDW: 15.9 % — ABNORMAL HIGH (ref 11.5–15.5)
WBC: 4.5 K/uL (ref 4.0–10.5)
nRBC: 0 % (ref 0.0–0.2)

## 2024-03-19 LAB — MAGNESIUM: Magnesium: 1.7 mg/dL (ref 1.7–2.4)

## 2024-03-19 LAB — BASIC METABOLIC PANEL WITH GFR
Anion gap: 8 (ref 5–15)
BUN: 7 mg/dL (ref 6–20)
CO2: 24 mmol/L (ref 22–32)
Calcium: 9 mg/dL (ref 8.9–10.3)
Chloride: 106 mmol/L (ref 98–111)
Creatinine, Ser: 0.38 mg/dL — ABNORMAL LOW (ref 0.44–1.00)
GFR, Estimated: >60 mL/min (ref >60)
Glucose, Bld: 75 mg/dL (ref 70–99)
Potassium: 3.6 mmol/L (ref 3.5–5.1)
Sodium: 138 mmol/L (ref 135–145)

## 2024-03-19 LAB — GRAM STAIN: Gram Stain: NONE SEEN

## 2024-03-19 LAB — GLUCOSE, PLEURAL OR PERITONEAL FLUID: Glucose, Fluid: 87 mg/dL

## 2024-03-19 LAB — T4, FREE: Free T4: 0.76 ng/dL (ref 0.61–1.12)

## 2024-03-19 LAB — TSH: TSH: 5.645 u[IU]/mL — ABNORMAL HIGH (ref 0.350–4.500)

## 2024-03-19 SURGERY — RIGHT HEART CATH
Anesthesia: LOCAL

## 2024-03-19 MED ORDER — MIDAZOLAM HCL 2 MG/2ML IJ SOLN
INTRAMUSCULAR | Status: AC
  Filled 2024-03-19: qty 2

## 2024-03-19 MED ORDER — LIDOCAINE HCL (PF) 1 % IJ SOLN
INTRAMUSCULAR | Status: DC | PRN
  Administered 2024-03-19: 5 mL

## 2024-03-19 MED ORDER — FENTANYL CITRATE (PF) 100 MCG/2ML IJ SOLN
INTRAMUSCULAR | Status: AC
  Filled 2024-03-19: qty 2

## 2024-03-19 MED ORDER — MIDAZOLAM HCL 2 MG/2ML IJ SOLN
INTRAMUSCULAR | Status: DC | PRN
  Administered 2024-03-19: 2 mg via INTRAVENOUS

## 2024-03-19 MED ORDER — HEPARIN (PORCINE) IN NACL 1000-0.9 UT/500ML-% IV SOLN
INTRAVENOUS | Status: DC | PRN
  Administered 2024-03-19: 500 mL

## 2024-03-19 MED ORDER — LIDOCAINE HCL (PF) 1 % IJ SOLN
INTRAMUSCULAR | Status: AC
Start: 2024-03-19 — End: 2024-03-19
  Filled 2024-03-19: qty 30

## 2024-03-19 MED ORDER — FENTANYL CITRATE (PF) 100 MCG/2ML IJ SOLN
INTRAMUSCULAR | Status: DC | PRN
  Administered 2024-03-19: 25 ug via INTRAVENOUS
  Administered 2024-03-19: 50 ug via INTRAVENOUS

## 2024-03-19 SURGICAL SUPPLY — 10 items
CATH SWAN GANZ 7F STRAIGHT (CATHETERS) IMPLANT
PACK CARDIAC CATHETERIZATION (CUSTOM PROCEDURE TRAY) IMPLANT
SHEATH PINNACLE 7F 10CM (SHEATH) IMPLANT
SHEATH PROBE COVER 6X72 (BAG) IMPLANT
SHIELD CATH-GARD CONTAMINATION (MISCELLANEOUS) IMPLANT
TRANSDUCER W/STOPCOCK (MISCELLANEOUS) IMPLANT
TRAY PERICARDIOCENTESIS 6FX60 (TRAY / TRAY PROCEDURE) IMPLANT
TUBING ART PRESS 72 MALE/FEM (TUBING) IMPLANT
WIRE MICRO SET SILHO 5FR 7 (SHEATH) IMPLANT
WIRE MICROINTRODUCER 60CM (WIRE) IMPLANT

## 2024-03-19 NOTE — Progress Notes (Signed)
  Progress Note   Patient: Erin Higgins FMW:[id number] DOB: 23-Dec-1970 DOA: 03/16/2024     3 DOS: the patient was seen and examined on 03/19/2024   Brief hospital course: 53 year old female with a history of systemic sclerosis, scleroderma on methotrexate , HTN, vitamin vitamin D deficiency-presented to ED with chief complaint of palpitation, which improved with IV fluid hydration, supplemental oxygen.  Denied any chest pain, shortness of breath, or diaphoresis.   ED evaluation: Blood pressure (!) 117/101, pulse 85, temperature 98.1 F (36.7 C), temperature source Oral, resp. rate 20, SpO2 98%.   LABs: Hemoglobin 9.8, potassium 3.2, calcium 8.3, magnesium  1.7 Troponin 56, 56,   ZXH:Yzjmu rate 94, QTc 351, ventricular bigeminy is no obvious ST elevation or depressio     Chest x-ray mild cardiomegaly no acute changes CT angio: Moderate pericardial effusion, new from 09/19/2022. 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  bronchial thickening consistent with bronchitis or reactive airways disease, left lower lobe opacity  Assessment and Plan: Pericardial effusion -Cardiology following -repeat limited echo today to evaluate pericardial effusion -Repeat 2d echo with large pericardial effusion s/p pericardiocentesis -Follow up per Cardiology recs   Systemic sclerosis (HCC) seronegative RA + systemic sclerosis  -Stable -On methotrexate  - S/p treatment with IV Actemra on 02/14/2024 Currently scheduled for 4 mg/kg every 28 days -Close follow-up with Dr. Lonni Ester as outpt   Sclerodactyly Sclerodactyly daily with RA Stable - Continue home medication including methotrexate  q. weekly   Vitamin D deficiency - Continue vitamin D supplements   Bronchitis Continue azithromycin  -for presumed bronchitis - procal<0.10 -Patient is afebrile, normotensive, with no leukocytosis -on RA   Hypomagnesemia -Cont to correct Mg as needed, goal >2    Hypokalemia - Cont to correct as needed - Goal >4   Subjective: Seen post-pericardiocentesis. Without complaints  Physical Exam: Vitals:   03/19/24 1410 03/19/24 1440 03/19/24 1454 03/19/24 1604  BP: (!) 118/97 117/73  116/70  Pulse: (!) 115 (!) 26    Resp: 18 18  17   Temp: 97.8 F (36.6 C)  97.8 F (36.6 C) 97.9 F (36.6 C)  TempSrc: Oral  Oral Oral  SpO2: 100% 100%    Weight:      Height:       General exam: Awake, laying in bed, in nad Respiratory system: Normal respiratory effort, no wheezing Cardiovascular system: regular rate, s1, s2 Gastrointestinal system: Soft, nondistended, positive BS Central nervous system: CN2-12 grossly intact, strength intact Extremities: Perfused, no clubbing Skin: Normal skin turgor, no notable skin lesions seen Psychiatry: Mood normal // no visual hallucinations   Data Reviewed:  Labs reviewed: Na 138, K 3.6, Cr 0.38, Mg 1.7, WBC 4.5, Hgb 10.0, Plts 296  Family Communication: Pt in room, family at bedside  Disposition: Status is: Inpatient Remains inpatient appropriate because: severity of illness  Planned Discharge Destination: Home    Author: Garnette Pelt, MD 03/19/2024 5:37 PM  For on call review [email or web address].

## 2024-03-19 NOTE — H&P (View-Only) (Signed)
  Progress Note  Patient Name: Erin Higgins Date of Encounter: 03/19/2024 [facility] Cardiologist: Vishnu P Mallipeddi, MD   Interval Summary   No acute complaints Ready for procedure today Reports mild shortness of breath at night, when laying flat Family present in room. All questions were answered   Vital Signs Vitals:   03/19/24 0011 03/19/24 0500 03/19/24 0738 03/19/24 0814  BP: (!) 106/58 112/68 105/63   Pulse: 80 72 71   Resp: 17 16  20   Temp: 98 F (36.7 C) 97.9 F (36.6 C) 97.9 F (36.6 C)   TempSrc: Oral Oral Oral   SpO2: 96% 99% 99%   Weight:  49.8 kg    Height:       No intake or output data in the 24 hours ending 03/19/24 0845    03/19/2024    5:00 AM 03/18/2024    4:41 AM 03/17/2024    2:57 AM  Last 3 Weights  Weight (lbs) 109 lb 12.8 oz 111 lb 6.4 oz 111 lb 1.6 oz  Weight (kg) 49.805 kg 50.531 kg 50.395 kg     Telemetry/ECG  Sinus rhythm, HR 80s - Personally Reviewed  Physical Exam  GEN: No acute distress.   Neck: No JVD Cardiac: RRR, no murmurs, rubs, or gallops.  Respiratory: Clear to auscultation bilaterally. GI: Soft, nontender, non-distended  MS: No edema  Assessment & Plan   Large pericardial effusion Repeat echo showed increase in pericardial effusion LVEF increased, RV collapse, dilated IVC that does not collapse  No clinical signs of tamponade  Suspect may be secondary to systemic sclerosis  Plan for RHC and pericardiocentesis today   Palpitations  No associated arrhythmia  Denies any recent palpitations  Continue to monitor on telemetry   Elevated troponin level  56 ? 56 Suspect secondary to pericardial effusion  CCTA 08/2022 showed normal coronaries   Per primary Scleroderma Systemic sclerosis  Bronchitis  Acute on chronic anemia  Electrolyte disturbances    For questions or updates, please contact [facility] Please consult [email or web address] for contact info under         Signed, Erin DELENA Donath, PA-C

## 2024-03-19 NOTE — TOC Initial Note (Signed)
 Transition of Care [facility]) - Initial/Assessment Note    Patient Details  Name: Erin Higgins MRN: [id number] Date of Birth: [birth year]  [facility] of [facility] CM/SW Contact:    Sudie Erminio Deems, RN Phone Number: 03/19/2024, 12:01 PM  Clinical Narrative: Patient presented for palpitations and being evaluated for pericardial effusion. PTA patient was from home. Patient has support of her mother that was at the bedside during the visit. Inpatient Case Manager received a consult for PCP. Patient has PCP Christyne Hurst in [place]. Inpatient Case Manager will continue to follow for additional disposition needs.                     Expected Discharge Plan: Home/Self Care Barriers to Discharge: No Barriers Identified   Patient Goals and CMS Choice Patient states their goals for this hospitalization and ongoing recovery are:: Plan to return home once stable   Choice offered to / list presented to : NA      Expected Discharge Plan and Services In-house Referral: NA Discharge Planning Services: NA Post Acute Care Choice: NA Living arrangements for the past 2 months: Single Family Home                   DME Agency: NA       HH Arranged: NA          Prior Living Arrangements/Services Living arrangements for the past 2 months: Single Family Home Lives with:: Relatives Patient language and need for interpreter reviewed:: Yes Do you feel safe going back to the place where you live?: Yes      Need for Family Participation in Patient Care: No (Comment) Care giver support system in place?: No (comment)   Criminal Activity/Legal Involvement Pertinent to Current Situation/Hospitalization: No - Comment as needed  Activities of Daily Living   ADL Screening (condition at time of admission) Independently performs ADLs?: Yes (appropriate for developmental age) Is the patient deaf or have difficulty hearing?: No Does the patient have difficulty seeing, even when wearing  glasses/contacts?: No Does the patient have difficulty concentrating, remembering, or making decisions?: No  Permission Sought/Granted Permission sought to share information with : Family Supports                Emotional Assessment Appearance:: Appears stated age       Alcohol / Substance Use: Not Applicable Psych Involvement: No (comment)  Admission diagnosis:  Pericardial effusion [I31.39] Patient Active Problem List   Diagnosis Date Noted   Pericardial effusion 03/16/2024   Hypokalemia 03/16/2024   Hypomagnesemia 03/16/2024   Bronchitis 03/16/2024   Seronegative rheumatoid arthritis (HCC) 02/15/2024   Herpes simplex of female genitalia    Systemic sclerosis (HCC) 01/18/2023   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   Vitamin D deficiency 12/19/2022   Raynaud phenomenon 08/08/2022   PCP:  Hurst Norleen PEDLAR, MD Pharmacy:   [facility] - [place], [place] - [address] [place] [address] Phone: [phone] Fax: [phone]  [facility] [phone] - [place], [place] - [address] [place] [address] Phone: [phone] Fax: [phone]  [facility] [address] [place] [address] Phone: [phone] Fax: [phone]     Social Drivers of Health (SDOH) Social History: SDOH Screenings   Food Insecurity: No Food Insecurity (03/16/2024)  Housing: High Risk (03/16/2024)  Transportation Needs: No Transportation Needs (03/16/2024)  Utilities: Not At Risk (03/16/2024)  Social Connections: Unknown (03/16/2024)  Tobacco Use: Low Risk  (03/16/2024)   SDOH Interventions:     Readmission Risk Interventions     No data to display

## 2024-03-19 NOTE — Plan of Care (Signed)

## 2024-03-19 NOTE — Progress Notes (Signed)
 Chaplain visited Pt at bedside per her request. Pt shared that she  is scheduled for surgery today.  Chaplain provided emotional and spiritual support and offered prayer per the Pt's request.

## 2024-03-19 NOTE — Progress Notes (Signed)
  Progress Note  Patient Name: Erin Higgins Date of Encounter: 03/19/2024 [facility] Cardiologist: Vishnu P Mallipeddi, MD   Interval Summary   No acute complaints Ready for procedure today Reports mild shortness of breath at night, when laying flat Family present in room. All questions were answered   Vital Signs Vitals:   03/19/24 0011 03/19/24 0500 03/19/24 0738 03/19/24 0814  BP: (!) 106/58 112/68 105/63   Pulse: 80 72 71   Resp: 17 16  20   Temp: 98 F (36.7 C) 97.9 F (36.6 C) 97.9 F (36.6 C)   TempSrc: Oral Oral Oral   SpO2: 96% 99% 99%   Weight:  49.8 kg    Height:       No intake or output data in the 24 hours ending 03/19/24 0845    03/19/2024    5:00 AM 03/18/2024    4:41 AM 03/17/2024    2:57 AM  Last 3 Weights  Weight (lbs) 109 lb 12.8 oz 111 lb 6.4 oz 111 lb 1.6 oz  Weight (kg) 49.805 kg 50.531 kg 50.395 kg     Telemetry/ECG  Sinus rhythm, HR 80s - Personally Reviewed  Physical Exam  GEN: No acute distress.   Neck: No JVD Cardiac: RRR, no murmurs, rubs, or gallops.  Respiratory: Clear to auscultation bilaterally. GI: Soft, nontender, non-distended  MS: No edema  Assessment & Plan   Large pericardial effusion Repeat echo showed increase in pericardial effusion LVEF increased, RV collapse, dilated IVC that does not collapse  No clinical signs of tamponade  Suspect may be secondary to systemic sclerosis  Plan for RHC and pericardiocentesis today   Palpitations  No associated arrhythmia  Denies any recent palpitations  Continue to monitor on telemetry   Elevated troponin level  56 ? 56 Suspect secondary to pericardial effusion  CCTA 08/2022 showed normal coronaries   Per primary Scleroderma Systemic sclerosis  Bronchitis  Acute on chronic anemia  Electrolyte disturbances    For questions or updates, please contact [facility] Please consult [email or web address] for contact info under         Signed, Waddell DELENA Donath, PA-C

## 2024-03-19 NOTE — Progress Notes (Signed)
 Echocardiogram 2D Echocardiogram has been performed.  Erin Higgins 03/19/2024, 1:44 PM

## 2024-03-19 NOTE — Interval H&P Note (Signed)
 History and Physical Interval Note:  03/19/2024 11:22 AM  Erin Higgins  has presented today for surgery, with the diagnosis of Pericardial Effusion.  The various methods of treatment have been discussed with the patient and family. After consideration of risks, benefits and other options for treatment, the patient has consented to  Procedure(s): RIGHT HEART CATH (N/A) and possible pericardial tap as a surgical intervention.  The patient's history has been reviewed, patient examined, no change in status, stable for surgery.  I have reviewed the patient's chart and labs.  Questions were answered to the patient's satisfaction.     Gordy Bergamo

## 2024-03-20 ENCOUNTER — Other Ambulatory Visit: Payer: Self-pay | Admitting: Cardiology

## 2024-03-20 ENCOUNTER — Encounter (HOSPITAL_COMMUNITY): Payer: Self-pay | Admitting: Cardiology

## 2024-03-20 DIAGNOSIS — I3139 Other pericardial effusion (noninflammatory): Secondary | ICD-10-CM | POA: Diagnosis not present

## 2024-03-20 LAB — CBC
HCT: 33.3 % — ABNORMAL LOW (ref 36.0–46.0)
Hemoglobin: 10.6 g/dL — ABNORMAL LOW (ref 12.0–15.0)
MCH: 28.3 pg (ref 26.0–34.0)
MCHC: 31.8 g/dL (ref 30.0–36.0)
MCV: 88.8 fL (ref 80.0–100.0)
Platelets: 302 K/uL (ref 150–400)
RBC: 3.75 MIL/uL — ABNORMAL LOW (ref 3.87–5.11)
RDW: 16.3 % — ABNORMAL HIGH (ref 11.5–15.5)
WBC: 4.3 K/uL (ref 4.0–10.5)
nRBC: 0 % (ref 0.0–0.2)

## 2024-03-20 LAB — BASIC METABOLIC PANEL WITH GFR
Anion gap: 9 (ref 5–15)
BUN: 13 mg/dL (ref 6–20)
CO2: 21 mmol/L — ABNORMAL LOW (ref 22–32)
Calcium: 8.8 mg/dL — ABNORMAL LOW (ref 8.9–10.3)
Chloride: 108 mmol/L (ref 98–111)
Creatinine, Ser: 0.49 mg/dL (ref 0.44–1.00)
GFR, Estimated: >60 mL/min (ref >60)
Glucose, Bld: 100 mg/dL — ABNORMAL HIGH (ref 70–99)
Potassium: 3.5 mmol/L (ref 3.5–5.1)
Sodium: 138 mmol/L (ref 135–145)

## 2024-03-20 LAB — GLUCOSE, CAPILLARY: Glucose-Capillary: 112 mg/dL — ABNORMAL HIGH (ref 70–99)

## 2024-03-20 MED ORDER — MAGNESIUM OXIDE -MG SUPPLEMENT 400 (240 MG) MG PO TABS
400.0000 mg | ORAL_TABLET | Freq: Two times a day (BID) | ORAL | Status: AC
  Administered 2024-03-20 (×2): 400 mg via ORAL
  Filled 2024-03-20 (×2): qty 1

## 2024-03-20 MED ORDER — DIPHENHYDRAMINE-ZINC ACETATE 2-0.1 % EX CREA
TOPICAL_CREAM | Freq: Two times a day (BID) | CUTANEOUS | Status: DC | PRN
  Filled 2024-03-20: qty 28

## 2024-03-20 MED ORDER — POTASSIUM CHLORIDE CRYS ER 20 MEQ PO TBCR
40.0000 meq | EXTENDED_RELEASE_TABLET | Freq: Two times a day (BID) | ORAL | Status: AC
  Administered 2024-03-20 (×2): 40 meq via ORAL
  Filled 2024-03-20 (×2): qty 2

## 2024-03-20 MED ORDER — MAGNESIUM SULFATE 4 GM/100ML IV SOLN
4.0000 g | Freq: Once | INTRAVENOUS | Status: AC
  Administered 2024-03-20: 4 g via INTRAVENOUS
  Filled 2024-03-20: qty 100

## 2024-03-20 MED ORDER — POTASSIUM CHLORIDE CRYS ER 20 MEQ PO TBCR
40.0000 meq | EXTENDED_RELEASE_TABLET | Freq: Once | ORAL | Status: AC
  Administered 2024-03-20: 40 meq via ORAL
  Filled 2024-03-20: qty 2

## 2024-03-20 NOTE — Progress Notes (Signed)
 [facility] PROGRESS NOTE    Progress Note  Erin Higgins  FMW:[id number] DOB: January 03, 1971 DOA: 03/16/2024 PCP: Shona Norleen PEDLAR, MD     Brief Narrative:   Erin Higgins is an 53 y.o. 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  Repeated limited 2D echo showed resolution of pericardial effusion Further recommendations per cardiology. Hold Actemra till next week.  Mild pulmonary hypertension who group 2 Right heart cath showed elevated pulmonary hypertension. This might be contributing to her mild shortness of breath follow-up with cardiology as an outpatient.  Systemic sclerosis (HCC) On Actemra and methotrexate . She gets Actemra every 28 days. Follow-up with Dr. Brien as an outpatient.  Normocytic anemia: Follow-up PCP as outpatient.  Scleral dactylitis: Continue methotrexate  weekly.  Vitamin D deficiency: Continue supplementation.  Presumed bronchitis: Completed 5-day course of azithromycin .  Hypomagnesemia/hypokalemia: Try to keep potassium greater than 4 magnesium  greater than 2.     DVT prophylaxis: scd's Family Communication:none Status is: Inpatient Remains inpatient appropriate because: Scleroderma related pericardial effusion    Code Status:     Code Status Orders  (From admission, onward)           Start     Ordered   03/16/24 0955  Full code  Continuous       Question:  By:  Answer:  Consent: discussion documented in EHR   03/16/24 0954           Code Status History      Date Active Date Inactive Code Status Order ID Comments User Context   05/02/2012 1951 05/04/2012 1640 Full Code [id number]  Ronalee Bernarda Jansky, RN Inpatient         IV Access:   Peripheral IV   Procedures and diagnostic studies:   ECHOCARDIOGRAM LIMITED Result Date: 03/19/2024    ECHOCARDIOGRAM LIMITED REPORT   Patient Name:   Erin Higgins Date of Exam: 03/19/2024 Medical Rec #:  [id number]         Height:       65.0 in Accession #:    [id number]        Weight:       109.8 lb Date of Birth:  1970-11-24          BSA:          1.533 m Patient Age:    53 years          BP:           131/83 mmHg Patient Gender: F                 HR:           72 bpm. Exam Location:  Inpatient Procedure: Limited Echo (Both Spectral and Color Flow Doppler were utilized            during procedure). Indications:    Pericardiocentesis  History:        Patient has prior history of Echocardiogram examinations, most                 recent 03/18/2024.  Sonographer:    Thea Norlander RCS Referring Phys: GORDY BERGAMO IMPRESSIONS  1. Left ventricular ejection fraction, by estimation, is 65 to 70%. The left ventricle has normal function. The left ventricle has no regional wall motion abnormalities.  2. Right ventricular systolic function is hyperdynamic.  3. Large pericardial effusion. The pericardial effusion is circumferential prior to procedure. This is a procedural echo to assist with pericardiocentesis. Through series resolution of pericardial effusion. Comparison(s): Prior images reviewed side by side. Pericardial effusion resolves by end of procedure. FINDINGS  Left Ventricle: Left ventricular ejection fraction, by estimation, is 65 to 70%. The left ventricle has normal function. The left ventricle has no regional wall motion abnormalities. Right Ventricle: Right ventricular systolic function is hyperdynamic. Pericardium: A large pericardial effusion is present. The pericardial effusion is circumferential. [name] MD Electronically signed by [name] MD Signature Date/Time: 03/19/2024/1:07:18 PM    Final    CARDIAC CATHETERIZATION Result Date: 03/19/2024 Right heart catheterization and pericardiocentesis 03/19/2024: RA 10/8, mean 7 mmHg RV 38/3, EDP 9 mmHg PA 39/13, mean 25 mmHg.  PA saturation 76%. PW 15/23, mean 16 mmHg.  Aortic saturation was 100%. QP/QS 1.00.  CO 6.31, CI 4.07 by Fick.  PAPi 3.7. 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 Pericardiocentesis: 250 cc of straw-colored fluid aspirated without any complication.  Sent to the lab for evaluation.   ECHOCARDIOGRAM LIMITED Result Date: 03/18/2024    ECHOCARDIOGRAM LIMITED REPORT   Patient Name:   Erin Higgins Date of Exam: 03/18/2024 Medical Rec #:  [id number]         Height:       65.0 in Accession #:    [id number]        Weight:       111.4 lb Date of Birth:  May 28, 1971          BSA:          1.543 m Patient Age:    53 years          BP:           98/71 mmHg Patient Gender: F                 HR:           74 bpm. Exam Location:  Inpatient Procedure: 2D Echo, Limited Echo and Cardiac Doppler (Both Spectral and Color            Flow Doppler were utilized during procedure). Indications:    Pericardial effusion  History:        Patient has prior history of Echocardiogram examinations, most                 recent 03/16/2024. Pericardial effusion,.  Sonographer:    BERNARDA ROCKS Referring Phys: Bellicus.Bora KENNETH C HILTY IMPRESSIONS  1. Limited echo for pericardial effusion. Left ventricular ejection fraction, by estimation, is 65 to 70%. Left ventricular ejection fraction by PLAX is 66 %. The left ventricle has normal function.  2. The effusion is worse posteriorly. Cannot rule out echocardiographic tamponade physiology- the heart freely swings in the pericardium and the IVC is plethoric. Large pericardial effusion. The pericardial effusion is circumferential.  3. The inferior  vena cava is dilated in size with <50% respiratory variability, suggesting right atrial pressure of 15 mmHg. Comparison(s): Changes from prior study are noted. 03/16/2024: LVEF 60-65%, large pericardial effusion. Compared to the prior study, the pericardial effusion measures larger (with the posterior fluid colllection at 2.56 cm vs 2.21 cm. FINDINGS  Left Ventricle: Limited echo for pericardial effusion. Left ventricular ejection fraction, by estimation, is 65 to 70%. Left ventricular ejection fraction by PLAX is 66 %. The left ventricle has normal function. Pericardium: The effusion is worse posteriorly. Cannot rule out echocardiographic tamponade physiology- the heart freely swings in the pericardium and the IVC is plethoric. A large pericardial effusion is present. The pericardial effusion is circumferential. There is diastolic collapse of the right ventricular free wall and excessive respiratory variation in septal movement. Venous: The inferior vena cava is dilated in size with less than 50% respiratory variability, suggesting right atrial pressure of 15 mmHg. LEFT VENTRICLE PLAX 2D LV EF:         Left ventricular ejection fraction by PLAX is 66 %. LVIDd:         4.70 cm LVIDs:         3.00 cm LV PW:         0.80 cm LV IVS:        0.70 cm LVOT diam:     1.80 cm LVOT Area:     2.54 cm  LV Volumes (MOD) LV vol d, MOD A4C: 111.0 ml LV vol s, MOD A4C: 43.2 ml LV SV MOD A4C:     111.0 ml RIGHT VENTRICLE         IVC TAPSE (M-mode): 2.1 cm  IVC diam: 2.30 cm LEFT ATRIUM         Index LA diam:    3.10 cm 2.01 cm/m   AORTA Ao Root diam: 2.60 cm Ao Asc diam:  2.80 cm  SHUNTS Systemic Diam: 1.80 cm Vinie Maxcy MD Electronically signed by Vinie Maxcy MD Signature Date/Time: 03/18/2024/4:09:21 PM    Final      Medical Consultants:   None.   Subjective:    Erin Higgins Erin Higgins she relates she feels slightly better. Tolerating her diet this morning.  Objective:    Vitals:   03/19/24 2100 03/20/24 0005  03/20/24 0634 03/20/24 0805  BP: 122/74 108/67 103/67 111/68  Pulse:    72  Resp: 18 18 18 20   Temp: 98.2 F (36.8 C) 98.1 F (36.7 C) 98.1 F (36.7 C) 98 F (36.7 C)  TempSrc: Oral Oral Oral Oral  SpO2:    100%  Weight:   49.6 kg   Height:       SpO2: 100 %   Intake/Output Summary (Last 24 hours) at 03/20/2024 0903 Last data filed at 03/19/2024 1300 Gross per 24 hour  Intake 240 ml  Output --  Net 240 ml   Filed Weights   03/18/24 0441 03/19/24 0500 03/20/24 0634  Weight: 50.5 kg 49.8 kg 49.6 kg    Exam: General exam: In no acute distress. Respiratory system: Good air movement and clear to auscultation. Cardiovascular system: S1 & S2 heard, RRR. No JVD. Gastrointestinal system: Abdomen is nondistended, soft and nontender.  Extremities: No pedal edema. Skin: No rashes, lesions or ulcers Psychiatry: Judgement and insight appear normal. Mood & affect appropriate.    Data Reviewed:    Labs: Basic Metabolic Panel: Recent Labs  Lab 03/16/24 0332 03/16/24 1015 03/17/24 0334 03/18/24 0644 03/19/24 0512 03/19/24 1138 03/19/24 1141 03/20/24 0524  NA 140  --  139 135 138 141  141 141 138  K 3.2*  --  3.7 3.8 3.6 3.7  3.7 3.7 3.5  CL 110  --  109 106 106  --   --  108  CO2 23  --  22 22  24  --   --  21*  GLUCOSE 97  --  115* 94 75  --   --  100*  BUN 13  --  10 10 7   --   --  13  CREATININE 0.40*  --  0.42* 0.42* 0.38*  --   --  0.49  CALCIUM 8.3*  --  8.5* 8.8* 9.0  --   --  8.8*  MG 1.7  --   --   --  1.7  --   --   --   PHOS  --  2.4*  --   --   --   --   --   --    GFR Estimated Creatinine Clearance: 65.1 mL/min (by C-G formula based on SCr of 0.49 mg/dL). Liver Function Tests: Recent Labs  Lab 03/16/24 0332  AST 23  ALT 12  ALKPHOS 46  BILITOT 0.8  PROT 6.8  ALBUMIN 3.0*   Recent Labs  Lab 03/16/24 0332  LIPASE 34   No results for input(s): AMMONIA in the last 168 hours. Coagulation profile Recent Labs  Lab 03/16/24 1015  03/17/24 0334  INR 1.3* 1.1   COVID-19 Labs  No results for input(s): DDIMER, FERRITIN, LDH, CRP in the last 72 hours.  No results found for: SARSCOV2NAA  CBC: Recent Labs  Lab 03/16/24 0332 03/17/24 0334 03/18/24 0644 03/19/24 0512 03/19/24 1138 03/19/24 1141 03/20/24 0524  WBC 4.8 4.6 4.8 4.5  --   --  4.3  NEUTROABS 2.4  --   --   --   --   --   --   HGB 9.8* 9.3* 9.3* 10.0* 10.9*  11.2* 11.2* 10.6*  HCT 31.5* 29.2* 29.8* 31.4* 32.0*  33.0* 33.0* 33.3*  MCV 90.5 88.2 89.0 87.7  --   --  88.8  PLT 252 265 296 296  --   --  302   Cardiac Enzymes: No results for input(s): CKTOTAL, CKMB, CKMBINDEX, TROPONINI in the last 168 hours. BNP (last 3 results) No results for input(s): PROBNP in the last 8760 hours. CBG: Recent Labs  Lab 03/20/24 0807  GLUCAP 112*   D-Dimer: No results for input(s): DDIMER in the last 72 hours. Hgb A1c: No results for input(s): HGBA1C in the last 72 hours. Lipid Profile: No results for input(s): CHOL, HDL, LDLCALC, TRIG, CHOLHDL, LDLDIRECT in the last 72 hours. Thyroid  function studies: Recent Labs    03/19/24 0512  TSH 5.645*   Anemia work up: No results for input(s): VITAMINB12, FOLATE, FERRITIN, TIBC, IRON, RETICCTPCT in the last 72 hours. Sepsis Labs: Recent Labs  Lab 03/16/24 1025 03/17/24 0334 03/18/24 0644 03/19/24 0512 03/20/24 0524  PROCALCITON <0.10  --   --   --   --   WBC  --  4.6 4.8 4.5 4.3   Microbiology Recent Results (from the past 240 hours)  Culture, body fluid w Gram Stain-bottle     Status: None (Preliminary result)   Collection Time: 03/19/24 11:45 AM   Specimen: Fluid  Result Value Ref Range Status   Specimen Description FLUID PERICARDIAL  Final   Special Requests BOTTLES DRAWN AEROBIC AND ANAEROBIC  Final   Culture   Final    NO GROWTH < 12 HOURS Performed at [facility], [address], [place], [place] [address]    Report Status PENDING   Incomplete  Gram stain     Status: None   Collection Time: 03/19/24 11:45 AM   Specimen: Fluid  Result Value Ref Range Status   Specimen Description FLUID PERICARDIAL  Final   Special Requests NONE  Final   Gram Stain   Final    NO WBC SEEN NO ORGANISMS SEEN CYTOSPIN SMEAR Performed at [facility], [address], [place], [place] [address]    Report Status 03/19/2024 FINAL  Final     Medications:    amLODipine   2.5 mg Oral Daily   azithromycin   500 mg Oral Daily   famotidine   20 mg Oral QHS   [START ON 03/22/2024] methotrexate   20 mg Oral Weekly   potassium chloride   40 mEq Oral Once   sodium chloride  flush  3 mL Intravenous Q12H   sodium chloride  flush  3 mL Intravenous Q12H   Continuous Infusions:    LOS: 4 days   Erle Odell Castor  [facility]  03/20/2024, 9:03 AM

## 2024-03-20 NOTE — Progress Notes (Signed)
 Ordering outpatient echo to be completed in ~1 month to evaluate pericardial effusion s/p pericardiocentesis

## 2024-03-20 NOTE — Progress Notes (Signed)
  Progress Note  Patient Name: Erin Higgins Date of Encounter: 03/20/2024 [facility] Cardiologist: Vishnu P Mallipeddi, MD   Interval Summary   Patient resting in bed, family present Reports no issues with procedure yesterday Feels generally fatigued  Some shortness of breath with exertion post-procedure  Telemetry shows NSR, no arrhythmias   Vital Signs Vitals:   03/19/24 2100 03/20/24 0005 03/20/24 0634 03/20/24 0805  BP: 122/74 108/67 103/67 111/68  Pulse:    72  Resp: 18 18 18 20   Temp: 98.2 F (36.8 C) 98.1 F (36.7 C) 98.1 F (36.7 C) 98 F (36.7 C)  TempSrc: Oral Oral Oral Oral  SpO2:    100%  Weight:   49.6 kg   Height:        Intake/Output Summary (Last 24 hours) at 03/20/2024 0824 Last data filed at 03/19/2024 1300 Gross per 24 hour  Intake 240 ml  Output --  Net 240 ml      03/20/2024    6:34 AM 03/19/2024    5:00 AM 03/18/2024    4:41 AM  Last 3 Weights  Weight (lbs) 109 lb 4.8 oz 109 lb 12.8 oz 111 lb 6.4 oz  Weight (kg) 49.578 kg 49.805 kg 50.531 kg     Telemetry/ECG  Sinus rhythm, HR 70s - Personally Reviewed  Physical Exam  GEN: No acute distress.   Neck: No JVD Cardiac: RRR, no murmurs, rubs, or gallops.  Respiratory: Clear to auscultation bilaterally. GI: Soft, nontender, non-distended  MS: No edema  Assessment & Plan  Large pericardial effusion s/p pericardiocentesis 03/19/2024 Repeat echo showed increase in pericardial effusion Suspect may be secondary to systemic sclerosis  Patient then went for pericardiocentesis  250 cc of straw-colored fluid were removed without complication Echo showed resolution of pericardial effusion by end of procedure Patient without any symptoms  Continue to monitor on telemetry while admitted Will arrange for close outpatient follow up, any additional echocardiograms needed at discharge   Mild pulmonary hypertension, 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 56 Suspect secondary to pericardial effusion  CCTA 08/2022 showed normal coronaries  No active symptoms    Per primary Scleroderma Systemic sclerosis  Bronchitis  Acute on chronic anemia  Electrolyte disturbances   For questions or updates, please contact [facility] Please consult [email or web address] for contact info under         Signed, Waddell DELENA Donath, PA-C

## 2024-03-21 ENCOUNTER — Other Ambulatory Visit (HOSPITAL_COMMUNITY): Payer: Self-pay

## 2024-03-21 DIAGNOSIS — I3139 Other pericardial effusion (noninflammatory): Secondary | ICD-10-CM | POA: Diagnosis not present

## 2024-03-21 LAB — BASIC METABOLIC PANEL WITH GFR
Anion gap: 8 (ref 5–15)
BUN: 11 mg/dL (ref 6–20)
CO2: 25 mmol/L (ref 22–32)
Calcium: 9 mg/dL (ref 8.9–10.3)
Chloride: 103 mmol/L (ref 98–111)
Creatinine, Ser: 0.59 mg/dL (ref 0.44–1.00)
GFR, Estimated: >60 mL/min (ref >60)
Glucose, Bld: 95 mg/dL (ref 70–99)
Potassium: 4.9 mmol/L (ref 3.5–5.1)
Sodium: 136 mmol/L (ref 135–145)

## 2024-03-21 LAB — CBC
HCT: 35.7 % — ABNORMAL LOW (ref 36.0–46.0)
Hemoglobin: 11.4 g/dL — ABNORMAL LOW (ref 12.0–15.0)
MCH: 28 pg (ref 26.0–34.0)
MCHC: 31.9 g/dL (ref 30.0–36.0)
MCV: 87.7 fL (ref 80.0–100.0)
Platelets: 316 K/uL (ref 150–400)
RBC: 4.07 MIL/uL (ref 3.87–5.11)
RDW: 16.1 % — ABNORMAL HIGH (ref 11.5–15.5)
WBC: 4.9 K/uL (ref 4.0–10.5)
nRBC: 0 % (ref 0.0–0.2)

## 2024-03-21 LAB — CYTOLOGY - NON PAP

## 2024-03-21 LAB — MAGNESIUM: Magnesium: 2.5 mg/dL — ABNORMAL HIGH (ref 1.7–2.4)

## 2024-03-21 MED ORDER — DIPHENHYDRAMINE-ZINC ACETATE 2-0.1 % EX CREA
TOPICAL_CREAM | Freq: Two times a day (BID) | CUTANEOUS | 0 refills | Status: AC | PRN
  Filled 2024-03-21: qty 28.4, fill #0

## 2024-03-21 MED ORDER — TRAZODONE HCL 50 MG PO TABS
25.0000 mg | ORAL_TABLET | Freq: Every evening | ORAL | 1 refills | Status: AC | PRN
  Filled 2024-03-21 (×2): qty 30, 60d supply, fill #0

## 2024-03-21 MED ORDER — FOLIC ACID 1 MG PO TABS: 1.0000 mg | ORAL_TABLET | Freq: Every day | ORAL | Status: DC

## 2024-03-21 NOTE — Plan of Care (Signed)

## 2024-03-21 NOTE — Discharge Summary (Signed)
 Physician Discharge Summary  Erin Higgins FMW:[id number] DOB: January 06, 1971 DOA: 03/16/2024  PCP: Shona Norleen PEDLAR, MD  Admit date: 03/16/2024 Discharge date: 03/21/2024  Admitted From: Home Disposition:  Home  Recommendations for Outpatient Follow-up:  Follow up with cardiology in 1-2 weeks Please obtain BMP/CBC in one week   Home Health:No Equipment/Devices:None  Discharge Condition:Stable CODE STATUS:Full Diet recommendation: Heart Healthy  Brief/Interim Summary: 53 y.o. female past medical history of systemic sclerosis on methotrexate , essential hypertension vitamin D comes in complaining of palpitations which improved with fluid hydration was found to have a pericardial effusion   Discharge Diagnoses:  Principal Problem:   Pericardial effusion Active Problems:   Systemic sclerosis (HCC)   Sclerodactyly   Vitamin D deficiency   Hypokalemia   Hypomagnesemia   Bronchitis  Scleroderma related pericardial effusion status post pericardiocentesis on 03/19/2024, Cardiology was consulted recommended right heart cath and she status post pericardiocentesis with 250 cc straw fluid collection with no complications. No signs of infection. Repeated 2D echo showed resolution of pericardial effusion. Cardiology recommended to follow-up with them in 1 month to repeat 2D echo.  Mild pulmonary hypertension group 2: Follow-up with cardiology as an outpatient. This might be contributing to her shortness of breath.  Systemic sclerosis: Currently on methotrexate . She will resume it weekly, her next dose of Actemra will be scheduled by her rheumatologist.  Normocytic anemia: Follow-up PCP as an outpatient.  Vitamin D deficiency: Continue supplementation.  Acute bronchitis: She completed 5-day course of azithromycin .  Hypomagnesemia/hypokalemia: Repleted now improved.  Discharge Instructions  Discharge Instructions     Diet - low sodium heart healthy   Complete by: As directed     Increase activity slowly   Complete by: As directed       Allergies as of 03/21/2024       Reactions   Penicillins Swelling   Has patient had a PCN reaction causing immediate rash, facial/tongue/throat swelling, SOB or lightheadedness with hypotension: No Has patient had a PCN reaction causing severe rash involving mucus membranes or skin necrosis: No Has patient had a PCN reaction that required hospitalization: No Has patient had a PCN reaction occurring within the last 10 years: No If all of the above answers are NO, then may proceed with Cephalosporin use.        Medication List     TAKE these medications    amLODipine  2.5 MG tablet Commonly known as: NORVASC  TAKE 1 TABLET(2.5 MG) BY MOUTH DAILY   diphenhydrAMINE -zinc  acetate cream Commonly known as: BENADRYL  Apply topically 2 (two) times daily as needed for itching.   famotidine  20 MG tablet Commonly known as: PEPCID  Take 1 tablet by mouth daily.   liothyronine 5 MCG tablet Commonly known as: CYTOMEL Take 5 mcg by mouth daily.   methotrexate  2.5 MG tablet Commonly known as: RHEUMATREX Take 8 tablets (20 mg total) by mouth once a week. What changed: additional instructions   traZODone  50 MG tablet Commonly known as: DESYREL  Take 0.5 tablets (25 mg total) by mouth at bedtime as needed for sleep.        Allergies  Allergen Reactions   Penicillins Swelling    Has patient had a PCN reaction causing immediate rash, facial/tongue/throat swelling, SOB or lightheadedness with hypotension: No  Has patient had a PCN reaction causing severe rash involving mucus membranes or skin necrosis: No  Has patient had a PCN reaction that required hospitalization: No  Has patient had a PCN reaction occurring within the last 10 years:  No  If all of the above answers are NO, then may proceed with Cephalosporin use.    Consultations: Cardiology   Procedures/Studies: ECHOCARDIOGRAM LIMITED Result Date:  03/19/2024    ECHOCARDIOGRAM LIMITED REPORT   Patient Name:   Erin Higgins Date of Exam: 03/19/2024 Medical Rec #:  [id number]         Height:       65.0 in Accession #:    [id number]        Weight:       109.8 lb Date of Birth:  [birth year]          BSA:          1.533 m Patient Age:    53 years          BP:           131/83 mmHg Patient Gender: F                 HR:           72 bpm. Exam Location:  Inpatient Procedure: Limited Echo (Both Spectral and Color Flow Doppler were utilized            during procedure). Indications:    Pericardiocentesis  History:        Patient has prior history of Echocardiogram examinations, most                 recent 03/18/2024.  Sonographer:    Thea Norlander RCS Referring Phys: GORDY BERGAMO IMPRESSIONS  1. Left ventricular ejection fraction, by estimation, is 65 to 70%. The left ventricle has normal function. The left ventricle has no regional wall motion abnormalities.  2. Right ventricular systolic function is hyperdynamic.  3. Large pericardial effusion. The pericardial effusion is circumferential prior to procedure. This is a procedural echo to assist with pericardiocentesis. Through series resolution of pericardial effusion. Comparison(s): Prior images reviewed side by side. Pericardial effusion resolves by end of procedure. FINDINGS  Left Ventricle: Left ventricular ejection fraction, by estimation, is 65 to 70%. The left ventricle has normal function. The left ventricle has no regional wall motion abnormalities. Right Ventricle: Right ventricular systolic function is hyperdynamic. Pericardium: A large pericardial effusion is present. The pericardial effusion is circumferential. [name] MD Electronically signed by [name] MD Signature Date/Time: 03/19/2024/1:07:18 PM    Final    CARDIAC CATHETERIZATION Result Date: 03/19/2024 Right heart catheterization and pericardiocentesis 03/19/2024: RA 10/8, mean 7 mmHg RV 38/3, EDP 9 mmHg PA 39/13, mean 25  mmHg.  PA saturation 76%. PW 15/23, mean 16 mmHg.  Aortic saturation was 100%. QP/QS 1.00.  CO 6.31, CI 4.07 by Fick.  PAPi 3.7. 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 Pericardiocentesis: 250 cc of straw-colored fluid aspirated without any complication.  Sent to the lab for evaluation.   ECHOCARDIOGRAM LIMITED Result Date: 03/18/2024    ECHOCARDIOGRAM LIMITED REPORT   Patient Name:   Erin Higgins Date of Exam: 03/18/2024 Medical Rec #:  [id number]         Height:       65.0 in Accession #:    [id number]        Weight:       111.4 lb Date of Birth:  [birth year]          BSA:          1.543 m Patient Age:    53 years  BP:           98/71 mmHg Patient Gender: F                 HR:           74 bpm. Exam Location:  Inpatient Procedure: 2D Echo, Limited Echo and Cardiac Doppler (Both Spectral and Color            Flow Doppler were utilized during procedure). Indications:    Pericardial effusion  History:        Patient has prior history of Echocardiogram examinations, most                 recent 03/16/2024. Pericardial effusion,.  Sonographer:    BERNARDA ROCKS Referring Phys: Bellicus.Bora KENNETH C HILTY IMPRESSIONS  1. Limited echo for pericardial effusion. Left ventricular ejection fraction, by estimation, is 65 to 70%. Left ventricular ejection fraction by PLAX is 66 %. The left ventricle has normal function.  2. The effusion is worse posteriorly. Cannot rule out echocardiographic tamponade physiology- the heart freely swings in the pericardium and the IVC is plethoric. Large pericardial effusion. The pericardial effusion is circumferential.  3. The inferior vena cava is dilated in size with <50% respiratory variability, suggesting right atrial pressure of 15 mmHg. Comparison(s): Changes from prior study are noted. 03/16/2024: LVEF 60-65%, large pericardial effusion. Compared to the prior study, the pericardial effusion measures larger (with the  posterior fluid colllection at 2.56 cm vs 2.21 cm. FINDINGS  Left Ventricle: Limited echo for pericardial effusion. Left ventricular ejection fraction, by estimation, is 65 to 70%. Left ventricular ejection fraction by PLAX is 66 %. The left ventricle has normal function. Pericardium: The effusion is worse posteriorly. Cannot rule out echocardiographic tamponade physiology- the heart freely swings in the pericardium and the IVC is plethoric. A large pericardial effusion is present. The pericardial effusion is circumferential. There is diastolic collapse of the right ventricular free wall and excessive respiratory variation in septal movement. Venous: The inferior vena cava is dilated in size with less than 50% respiratory variability, suggesting right atrial pressure of 15 mmHg. LEFT VENTRICLE PLAX 2D LV EF:         Left ventricular ejection fraction by PLAX is 66 %. LVIDd:         4.70 cm LVIDs:         3.00 cm LV PW:         0.80 cm LV IVS:        0.70 cm LVOT diam:     1.80 cm LVOT Area:     2.54 cm  LV Volumes (MOD) LV vol d, MOD A4C: 111.0 ml LV vol s, MOD A4C: 43.2 ml LV SV MOD A4C:     111.0 ml RIGHT VENTRICLE         IVC TAPSE (M-mode): 2.1 cm  IVC diam: 2.30 cm LEFT ATRIUM         Index LA diam:    3.10 cm 2.01 cm/m   AORTA Ao Root diam: 2.60 cm Ao Asc diam:  2.80 cm  SHUNTS Systemic Diam: 1.80 cm Vinie Maxcy MD Electronically signed by Vinie Maxcy MD Signature Date/Time: 03/18/2024/4:09:21 PM    Final    ECHOCARDIOGRAM COMPLETE Result Date: 03/16/2024    ECHOCARDIOGRAM REPORT   Patient Name:   Erin Higgins Date of Exam: 03/16/2024 Medical Rec #:  [id number]         Height:       65.0  in Accession #:    [id number]        Weight:       111.2 lb Date of Birth:  02/11/1971          BSA:          1.541 m Patient Age:    53 years          BP:           117/101 mmHg Patient Gender: F                 HR:           81 bpm. Exam Location:  [facility] Procedure: 2D Echo, Cardiac Doppler and Color Doppler  (Both Spectral and Color            Flow Doppler were utilized during procedure). Indications:    I31.3 Pericardial effusion (noninflammatory)  History:        Patient has prior history of Echocardiogram examinations, most                 recent 02/16/2023. Pericardial effusion.  Sonographer:    Ellouise Mose RDCS Referring Phys: [id number] SEYED A SHAHMEHDI  Sonographer Comments: Technically difficult study due to poor echo windows. IMPRESSIONS  1. Left ventricular ejection fraction, by estimation, is 60 to 65%. The left ventricle has normal function. The left ventricle has no regional wall motion abnormalities. Left ventricular diastolic parameters were normal.  2. Right ventricular systolic function is normal. The right ventricular size is normal. Tricuspid regurgitation signal is inadequate for assessing PA pressure.  3. Large pericardial effusion, without diastolic collapse of the RV, RA, or variation of septal motion. There is no respirophasic variation of the mitral valve velocities. 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 . Large pericardial effusion. The pericardial effusion is circumferential.  4. The mitral valve is normal in structure. No evidence of mitral valve regurgitation. No evidence of mitral stenosis.  5. The aortic valve is tricuspid. Aortic valve regurgitation is not visualized.  6. The inferior vena cava is dilated in size with <50% respiratory variability, suggesting right atrial pressure of 15 mmHg. Comparison(s): Prior images reviewed side by side. Pericardial effusion has increased in size. Consider repeating echocardiogram limited next week or if change in clinical situation. FINDINGS  Left Ventricle: Left ventricular ejection fraction, by estimation, is 60 to 65%. The left ventricle has normal function. The left ventricle has no regional wall motion abnormalities. The left ventricular internal cavity size was normal in size. There is  no left ventricular  hypertrophy. Left ventricular diastolic parameters were normal. Right Ventricle: The right ventricular size is normal. No increase in right ventricular wall thickness. Right ventricular systolic function is normal. Tricuspid regurgitation signal is inadequate for assessing PA pressure. Left Atrium: Left atrial size was normal in size. Right Atrium: Right atrial size was normal in size. Pericardium: Large pericardial effusion, without diastolic collapse of the RV, RA, or variation of septal motion. There is no respirophasic variation of the mitral valve velocities. 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 A large pericardial effusion is present. The pericardial effusion is circumferential. Mitral Valve: The mitral valve is normal in structure. No evidence of mitral valve regurgitation. No evidence of mitral valve stenosis. Tricuspid Valve: The tricuspid valve is normal in structure. Tricuspid valve regurgitation is not demonstrated. No evidence of tricuspid stenosis. Aortic Valve: The aortic valve is tricuspid. Aortic valve regurgitation  is not visualized. Pulmonic Valve: The pulmonic valve was normal in structure. Pulmonic valve regurgitation is trivial. No evidence of pulmonic stenosis. Aorta: The aortic root is normal in size and structure. Venous: The inferior vena cava is dilated in size with less than 50% respiratory variability, suggesting right atrial pressure of 15 mmHg. IAS/Shunts: No atrial level shunt detected by color flow Doppler.  LEFT VENTRICLE PLAX 2D LVIDd:         4.60 cm     Diastology LVIDs:         2.83 cm     LV e' medial:    8.70 cm/s LV PW:         0.90 cm     LV E/e' medial:  8.0 LV IVS:        1.00 cm     LV e' lateral:   8.86 cm/s LVOT diam:     1.90 cm     LV E/e' lateral: 7.9 LV SV:         63 LV SV Index:   41 LVOT Area:     2.84 cm  LV Volumes (MOD) LV vol d, MOD A2C: 90.4 ml LV vol d, MOD A4C: 82.7 ml LV vol s, MOD A2C: 28.8 ml LV vol s, MOD A4C:  42.2 ml LV SV MOD A2C:     61.6 ml LV SV MOD A4C:     82.7 ml LV SV MOD BP:      53.5 ml RIGHT VENTRICLE             IVC RV S prime:     19.00 cm/s  IVC diam: 2.30 cm TAPSE (M-mode): 2.6 cm LEFT ATRIUM           Index        RIGHT ATRIUM           Index LA diam:      2.90 cm 1.88 cm/m   RA Area:     11.00 cm LA Vol (A2C): 18.6 ml 12.07 ml/m  RA Volume:   20.50 ml  13.30 ml/m LA Vol (A4C): 22.7 ml 14.73 ml/m  AORTIC VALVE LVOT Vmax:   118.00 cm/s LVOT Vmean:  75.800 cm/s LVOT VTI:    0.221 m  AORTA Ao Root diam: 2.70 cm MITRAL VALVE MV Area (PHT): 4.06 cm    SHUNTS MV Decel Time: 187 msec    Systemic VTI:  0.22 m MV E velocity: 69.70 cm/s  Systemic Diam: 1.90 cm MV A velocity: 66.50 cm/s MV E/A ratio:  1.05 [name] MD Electronically signed by [name] MD Signature Date/Time: 03/16/2024/1:02:55 PM    Final    CT Angio Chest PE W and/or Wo Contrast Result Date: 03/16/2024 CLINICAL DATA:  Heart palpitations, awakened the patient from sleep this morning. EXAM: CT ANGIOGRAPHY CHEST WITH CONTRAST TECHNIQUE: Multidetector CT imaging of the chest was performed using the standard protocol during bolus administration of intravenous contrast. Multiplanar CT image reconstructions and MIPs were obtained to evaluate the vascular anatomy. RADIATION DOSE REDUCTION: This exam was performed according to the departmental dose-optimization program which includes automated exposure control, adjustment of the mA and/or kV according to patient size and/or use of iterative reconstruction technique. CONTRAST:  75mL OMNIPAQUE  IOHEXOL  350 MG/ML SOLN COMPARISON:  AP Lat chest today, portable chest 07/12/2022, and cardiac partial chest CT with contrast 09/19/2022. 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 Moderate pericardial fluid is seen new from 09/19/2022,  maximum thickness 2 cm. The fluid is low in density, 18 Hounsfield units and not suspicious  for pericardial hemorrhage. The pulmonary arteries are well opacified, normal caliber and clear through the segmental divisions. The subsegmental arteries are not well seen due to respiratory motion. They're normal where visible. The aorta and great vessels are normal. The pulmonary veins are normal in caliber. There is mild cardiomegaly, seen previously. No visible coronary calcification. Mediastinum/Nodes: No enlarged mediastinal, hilar, or axillary lymph nodes. The lower poles of the thyroid  gland, trachea, and esophagus demonstrate no significant findings. Lungs/Pleura: There is diffuse bronchial thickening. There is increased left lower lobe infrahilar opacity concerning for pneumonia or aspiration. The central airways are clear. Mild chronic elevation right hemidiaphragm. Linear scar-like opacities symmetrically bright apex, outer right upper lobe. Again noted is a 3 mm subpleural right middle lobe solid nodule on 6:56, and 2 adjacent 4 mm right lower lobe superior segment nodules on 6:65, all unchanged. No follow-up imaging is recommended. Benign process given length of stability. The lungs are otherwise clear. Upper Abdomen: No acute abnormality. Musculoskeletal: Slight thoracic levoscoliosis. No acute or significant osseous findings. No chest wall mass is seen. Review of the MIP images confirms the above findings. IMPRESSION: 1. No evidence of arterial dilatation or embolus through the segmental divisions. The subsegmental arteries are not well seen due to respiratory motion. 2. Moderate pericardial effusion, new from 09/19/2022. 3. 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 4. Diffuse bronchial thickening consistent with bronchitis or reactive airways disease. 5. Increased left lower lobe infrahilar opacity concerning for pneumonia or aspiration. Electronically Signed   By: Francis Quam M.D.   On: 03/16/2024 07:22   DG Chest 2 View Result Date: 03/16/2024 CLINICAL DATA:   Heart palpitations and chest discomfort. EXAM: CHEST - 2 VIEW COMPARISON:  AP chest portable 07/12/2022. FINDINGS: There is mild cardiomegaly. No vascular congestion is seen. No edema or pleural collections. The lungs are clear of infiltrates. The mediastinum is normally outlined. Thoracic cage is intact with slight thoracic levoscoliosis. Multiple superimposed telemetry leads. IMPRESSION: Mild cardiomegaly. No acute chest findings. Electronically Signed   By: Francis Quam M.D.   On: 03/16/2024 03:02   (Echo, Carotid, EGD, Colonoscopy, ERCP)    Subjective: No complaints feels great  Discharge Exam: Vitals:   03/20/24 2311 03/21/24 0459  BP: 116/67 112/64  Pulse: 76 70  Resp: 16 18  Temp: 98.2 F (36.8 C) 98.1 F (36.7 C)  SpO2: 99% 99%   Vitals:   03/20/24 1958 03/20/24 2311 03/21/24 0451 03/21/24 0459  BP: 105/67 116/67  112/64  Pulse: 82 76  70  Resp: 16 16  18   Temp: 98.3 F (36.8 C) 98.2 F (36.8 C)  98.1 F (36.7 C)  TempSrc: Oral Oral  Oral  SpO2: 100% 99%  99%  Weight:   49.4 kg   Height:        General: Pt is alert, awake, not in acute distress Cardiovascular: RRR, S1/S2 +, no rubs, no gallops Respiratory: CTA bilaterally, no wheezing, no rhonchi Abdominal: Soft, NT, ND, bowel sounds + Extremities: no edema, no cyanosis    The results of significant diagnostics from this hospitalization (including imaging, microbiology, ancillary and laboratory) are listed below for reference.     Microbiology: Recent Results (from the past 240 hours)  Culture, body fluid w Gram Stain-bottle     Status: None (Preliminary result)   Collection Time: 03/19/24 11:45 AM   Specimen: Fluid  Result Value  Ref Range Status   Specimen Description FLUID PERICARDIAL  Final   Special Requests BOTTLES DRAWN AEROBIC AND ANAEROBIC  Final   Culture   Final    NO GROWTH 2 DAYS Performed at [facility], [address], [place], [place] [address]    Report Status PENDING   Incomplete  Gram stain     Status: None   Collection Time: 03/19/24 11:45 AM   Specimen: Fluid  Result Value Ref Range Status   Specimen Description FLUID PERICARDIAL  Final   Special Requests NONE  Final   Gram Stain   Final    NO WBC SEEN NO ORGANISMS SEEN CYTOSPIN SMEAR Performed at [facility], [address], [place], [place] [address]    Report Status 03/19/2024 FINAL  Final     Labs: BNP (last 3 results) No results for input(s): BNP in the last 8760 hours. Basic Metabolic Panel: Recent Labs  Lab 03/16/24 0332 03/16/24 1015 03/17/24 0334 03/18/24 0644 03/19/24 0512 03/19/24 1138 03/19/24 1141 03/20/24 0524 03/21/24 0401  NA 140  --  139 135 138 141  141 141 138 136  K 3.2*  --  3.7 3.8 3.6 3.7  3.7 3.7 3.5 4.9  CL 110  --  109 106 106  --   --  108 103  CO2 23  --  22 22 24   --   --  21* 25  GLUCOSE 97  --  115* 94 75  --   --  100* 95  BUN 13  --  10 10 7   --   --  13 11  CREATININE 0.40*  --  0.42* 0.42* 0.38*  --   --  0.49 0.59  CALCIUM 8.3*  --  8.5* 8.8* 9.0  --   --  8.8* 9.0  MG 1.7  --   --   --  1.7  --   --   --  2.5*  PHOS  --  2.4*  --   --   --   --   --   --   --    Liver Function Tests: Recent Labs  Lab 03/16/24 0332  AST 23  ALT 12  ALKPHOS 46  BILITOT 0.8  PROT 6.8  ALBUMIN 3.0*   Recent Labs  Lab 03/16/24 0332  LIPASE 34   No results for input(s): AMMONIA in the last 168 hours. CBC: Recent Labs  Lab 03/16/24 0332 03/17/24 0334 03/18/24 0644 03/19/24 0512 03/19/24 1138 03/19/24 1141 03/20/24 0524 03/21/24 0401  WBC 4.8 4.6 4.8 4.5  --   --  4.3 4.9  NEUTROABS 2.4  --   --   --   --   --   --   --   HGB 9.8* 9.3* 9.3* 10.0* 10.9*  11.2* 11.2* 10.6* 11.4*  HCT 31.5* 29.2* 29.8* 31.4* 32.0*  33.0* 33.0* 33.3* 35.7*  MCV 90.5 88.2 89.0 87.7  --   --  88.8 87.7  PLT 252 265 296 296  --   --  302 316   Cardiac Enzymes: No results for input(s): CKTOTAL, CKMB, CKMBINDEX, TROPONINI in the last 168  hours. BNP: Invalid input(s): POCBNP CBG: Recent Labs  Lab 03/20/24 0807  GLUCAP 112*   D-Dimer No results for input(s): DDIMER in the last 72 hours. Hgb A1c No results for input(s): HGBA1C in the last 72 hours. Lipid Profile No results for input(s): CHOL, HDL, LDLCALC, TRIG, CHOLHDL, LDLDIRECT in the last 72  hours. Thyroid  function studies Recent Labs    03/19/24 0512  TSH 5.645*   Anemia work up No results for input(s): VITAMINB12, FOLATE, FERRITIN, TIBC, IRON, RETICCTPCT in the last 72 hours. Urinalysis    Component Value Date/Time   COLORURINE YELLOW 05/23/2022 1136   APPEARANCEUR CLEAR 05/23/2022 1136   LABSPEC 1.015 05/23/2022 1136   PHURINE 5.0 05/23/2022 1136   GLUCOSEU NEGATIVE 05/23/2022 1136   HGBUR NEGATIVE 05/23/2022 1136   BILIRUBINUR NEGATIVE 05/23/2022 1136   KETONESUR NEGATIVE 05/23/2022 1136   PROTEINUR NEGATIVE 05/23/2022 1136   UROBILINOGEN 2.0 (H) 01/28/2013 1202   NITRITE NEGATIVE 05/23/2022 1136   LEUKOCYTESUR NEGATIVE 05/23/2022 1136   Sepsis Labs Recent Labs  Lab 03/18/24 0644 03/19/24 0512 03/20/24 0524 03/21/24 0401  WBC 4.8 4.5 4.3 4.9   Microbiology Recent Results (from the past 240 hours)  Culture, body fluid w Gram Stain-bottle     Status: None (Preliminary result)   Collection Time: 03/19/24 11:45 AM   Specimen: Fluid  Result Value Ref Range Status   Specimen Description FLUID PERICARDIAL  Final   Special Requests BOTTLES DRAWN AEROBIC AND ANAEROBIC  Final   Culture   Final    NO GROWTH 2 DAYS Performed at [facility], [address], [place], [place] [address]    Report Status PENDING  Incomplete  Gram stain     Status: None   Collection Time: 03/19/24 11:45 AM   Specimen: Fluid  Result Value Ref Range Status   Specimen Description FLUID PERICARDIAL  Final   Special Requests NONE  Final   Gram Stain   Final    NO WBC SEEN NO ORGANISMS SEEN CYTOSPIN SMEAR Performed at [facility], [address], [place], [place] [address]    Report Status 03/19/2024 FINAL  Final     Time coordinating discharge: Over 35 minutes  SIGNED:   Erle Odell Castor, MD  [facility] 03/21/2024, 8:42 AM Pager   If 7PM-7AM, please contact night-coverage [email or web address] Password [email or web address]

## 2024-03-21 NOTE — Progress Notes (Addendum)
 Explained discharge instructions to patient. Reviewed follow up appointment and next medication administration times. Also reviewed education. Patient verbalized having an understanding for instructions given. All belongings are in the patient's possession to include TOC meds. IV and telemetry were removed. CCMD was notified. No other needs verbalized. Transporting patient to the  discharge lounge to await her ride.

## 2024-03-24 LAB — CULTURE, BODY FLUID W GRAM STAIN -BOTTLE: Culture: NO GROWTH

## 2024-03-28 ENCOUNTER — Ambulatory Visit (HOSPITAL_BASED_OUTPATIENT_CLINIC_OR_DEPARTMENT_OTHER): Attending: Internal Medicine | Admitting: Physical Therapy

## 2024-03-28 ENCOUNTER — Encounter: Payer: Self-pay | Admitting: Internal Medicine

## 2024-03-28 ENCOUNTER — Telehealth: Payer: Self-pay

## 2024-03-28 ENCOUNTER — Other Ambulatory Visit: Payer: Self-pay | Admitting: Pharmacist

## 2024-03-28 DIAGNOSIS — Z01810 Encounter for preprocedural cardiovascular examination: Secondary | ICD-10-CM | POA: Insufficient documentation

## 2024-03-28 DIAGNOSIS — M25552 Pain in left hip: Secondary | ICD-10-CM | POA: Insufficient documentation

## 2024-03-28 DIAGNOSIS — Z5181 Encounter for therapeutic drug level monitoring: Secondary | ICD-10-CM | POA: Insufficient documentation

## 2024-03-28 DIAGNOSIS — Z79899 Other long term (current) drug therapy: Secondary | ICD-10-CM | POA: Insufficient documentation

## 2024-03-28 DIAGNOSIS — Z111 Encounter for screening for respiratory tuberculosis: Secondary | ICD-10-CM | POA: Insufficient documentation

## 2024-03-28 NOTE — Telephone Encounter (Signed)
 Auth Submission: NO AUTH NEEDED Site of care: Site of care: [facility] Payer: [place] [employer] [place] [employer]  Medication & CPT/J Code(s) submitted: Actemra [id number] Diagnosis Code:  Route of submission (phone, fax, portal): phone Phone # Fax # Auth type: Buy/Bill HB Units/visits requested: q4weeks Reference number: [email or web address] [id number] Approval from: 03/28/24 to 06/26/24

## 2024-03-28 NOTE — Therapy (Signed)
  OUTPATIENT PHYSICAL THERAPY LOWER EXTREMITY EVALUATION   Patient Name: Erin Higgins MRN: [id number] DOB:1970-09-16, 53 y.o., female Today's Date: 03/29/2024  END OF SESSION:  PT End of Session - 03/29/24 1228     Visit Number 0          Past Medical History:  Diagnosis Date   Diverticulitis    Heartburn    Herpes simplex of female genitalia    Scleredema (HCC)    Tapeworm 04/2023   Past Surgical History:  Procedure Laterality Date   COLONOSCOPY     DILATION AND CURETTAGE OF UTERUS     PERICARDIOCENTESIS N/A 03/19/2024   Procedure: PERICARDIOCENTESIS;  Surgeon: Ladona Heinz, MD;  Location: [facility] INVASIVE CV LAB;  Service: Cardiovascular;  Laterality: N/A;   RIGHT HEART CATH N/A 03/19/2024   Procedure: RIGHT HEART CATH;  Surgeon: Ladona Heinz, MD;  Location: [facility] INVASIVE CV LAB;  Service: Cardiovascular;  Laterality: N/A;   TOOTH EXTRACTION  08/2023   TUBAL LIGATION  05/03/2012   Procedure: POST PARTUM TUBAL LIGATION;  Surgeon: Elveria Mungo, MD;  Location: [facility];  Service: Gynecology;  Laterality: Bilateral;  with filshie clips   Patient Active Problem List   Diagnosis Date Noted   High risk medication use 03/28/2024   Screening for tuberculosis 03/28/2024   Medication monitoring encounter 03/28/2024   Pericardial effusion 03/16/2024   Hypokalemia 03/16/2024   Hypomagnesemia 03/16/2024   Bronchitis 03/16/2024   Seronegative rheumatoid arthritis (HCC) 02/15/2024   Herpes simplex of female genitalia    Systemic sclerosis (HCC) 01/18/2023   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  Pt had a pericardiocentesis on 03/19/2024  and  was discharged from hospital on 03/21/24.  MD from hospital ordered pt to follow up with PCP and cardiology.  Pt saw her PA yesterday.  PA informed her that she thinks she still has a little fluid on her lungs, but nothing alarming.  Pt sees her rheumatologist on 10/17.  Pt has an echocardiogram later in October and has a follow up with cardiology in November.   PT withheld treatment today and will wait until she sees cardiology to be cleared for PT.  PT educated pt concerning POC and pt verbalized agreement      Pt has recently received PT from 05/25 to 09/25 for frequent falls.  Leigh Minerva III PT, DPT 03/29/24 12:34 PM

## 2024-03-28 NOTE — Progress Notes (Signed)
 Per Dr. Jeannetta: After review of labs from last appointment and discussion with patient, I am actually recommending tocilizumab as a treatment for seronegative rheumatoid arthritis overlap with scleroderma. I sent her drug information to review since it is a change from our discussed plan in clinic.   Actemra is more preferred based on most recent EULAR guidelines and particularly with seronegative disease. She would strongly prefer infusions due to discomfort injecting herself and has considerable restriction in her hands. Would start at 4 mg/kg monthly.  Referral placed for Actemra IV (G6737) at [facility] [phone]) [place] to start benefits investigation.  Diagnosis: seronegative rheumatoid arthritis; systemic sclerosis  Provider: Dr. Lonni Jeannetta  Dose: 4mg /kg every 4 weeks (can increase to 8mg /kg every 4 weeks in the future if needed) Premedications: acetaminophen  650mg  p.o. and diphenhydramine  25mg  p.o. Labs to be drawn with infusions: CBC and CMP at 1 month then every 3 months; TB gold annually, lipid panel at 1 month then every 6 months  Last dose/infusion date: NEW START Last Clinic Visit: 12/22/2023 Next Clinic Visit: 04/12/2024  Pertinent baseline labs: CBC 03/21/24, BMP 03/21/24,  HIV negative 03/16/24,  TB gold negative on 03/15/24 (drawn by PCP, in Care[facility]where) Hepatitis B/C/A negative on 05/23/2022  Once benefits are processed, infusion center will contact patient to schedule.   Sherry Pennant, PharmD, MPH, BCPS, CPP Clinical Pharmacist [facility])

## 2024-03-29 ENCOUNTER — Telehealth: Payer: Self-pay | Admitting: Internal Medicine

## 2024-03-29 NOTE — Telephone Encounter (Signed)
 Patient c/o Palpitations: STAT if patient c/o lightheadedness, shortness of breath, or chest pain  How long have you had palpitations/irregular HR/ Afib? Are you having the symptoms now? Palpitations started yesterday, she received some bad news and thinks this may have contributed to it.   Are you currently experiencing lightheadedness, SOB or CP? No   Do you have a history of afib (atrial fibrillation) or irregular heart rhythm? No   Have you checked your BP or HR? (document readings if available): does not have a machine   Are you experiencing any other symptoms?  No

## 2024-03-29 NOTE — Telephone Encounter (Signed)
 Replied to patient via mychart

## 2024-03-29 NOTE — Progress Notes (Signed)
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 left ventricular internal cavity size was normal in size. There is  no left ventricular hypertrophy. Left ventricular diastolic parameters were normal. Right Ventricle: The right ventricular size is normal. No increase in right ventricular wall thickness. Right ventricular systolic function is normal. Tricuspid regurgitation signal is inadequate for assessing PA pressure. Left Atrium: Left atrial size was normal in size. Right Atrium: Right atrial size was normal in size. Pericardium: Large pericardial effusion, without diastolic collapse of the RV, RA, or variation of septal motion. There is no respirophasic variation of the mitral valve velocities. 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 A large pericardial effusion is present. The pericardial effusion is circumferential. Mitral Valve: The mitral valve is normal in structure. No evidence of mitral valve regurgitation. No evidence of mitral valve stenosis. Tricuspid Valve: The tricuspid valve is normal in structure. Tricuspid valve regurgitation is not demonstrated. No evidence of tricuspid stenosis. Aortic Valve: The aortic valve is tricuspid. Aortic valve  regurgitation is not visualized. Pulmonic Valve: The pulmonic valve was normal in structure. Pulmonic valve regurgitation is trivial. No evidence of pulmonic stenosis. Aorta: The aortic root is normal in size and structure. Venous: The inferior vena cava is dilated in size with less than 50% respiratory variability, suggesting right atrial pressure of 15 mmHg. IAS/Shunts: No atrial level shunt detected by color flow Doppler.  LEFT VENTRICLE PLAX 2D LVIDd:         4.60 cm     Diastology LVIDs:         2.83 cm     LV e' medial:    8.70 cm/s LV PW:         0.90 cm     LV E/e' medial:  8.0 LV IVS:        1.00 cm     LV e' lateral:   8.86 cm/s LVOT diam:     1.90 cm     LV E/e' lateral: 7.9 LV SV:         63 LV SV Index:   41 LVOT Area:     2.84 cm  LV Volumes (MOD) LV vol d, MOD A2C:  90.4 ml LV vol d, MOD A4C: 82.7 ml LV vol s, MOD A2C: 28.8 ml LV vol s, MOD A4C: 42.2 ml LV SV MOD A2C:     61.6 ml LV SV MOD A4C:     82.7 ml LV SV MOD BP:      53.5 ml RIGHT VENTRICLE             IVC RV S prime:     19.00 cm/s  IVC diam: 2.30 cm TAPSE (M-mode): 2.6 cm LEFT ATRIUM           Index        RIGHT ATRIUM           Index LA diam:      2.90 cm 1.88 cm/m   RA Area:     11.00 cm LA Vol (A2C): 18.6 ml 12.07 ml/m  RA Volume:   20.50 ml  13.30 ml/m LA Vol (A4C): 22.7 ml 14.73 ml/m  AORTIC VALVE LVOT Vmax:   118.00 cm/s LVOT Vmean:  75.800 cm/s LVOT VTI:    0.221 m  AORTA Ao Root diam: 2.70 cm MITRAL VALVE MV Area (PHT): 4.06 cm    SHUNTS MV Decel Time: 187 msec    Systemic VTI:  0.22 m MV E velocity: 69.70 cm/s  Systemic Diam: 1.90 cm MV A velocity: 66.50 cm/s MV E/A ratio:  1.05 [name] MD Electronically signed by [name] MD Signature Date/Time: 03/16/2024/1:02:55 PM    Final    CT Angio Chest PE W and/or Wo Contrast Result Date: 03/16/2024 CLINICAL DATA:  Heart palpitations, awakened the patient from sleep this morning. EXAM: CT ANGIOGRAPHY CHEST WITH CONTRAST TECHNIQUE: Multidetector CT imaging of  the chest was performed using the standard protocol during bolus administration of intravenous contrast. Multiplanar CT image reconstructions and MIPs were obtained to evaluate the vascular anatomy. RADIATION DOSE REDUCTION: This exam was performed according to the departmental dose-optimization program which includes automated exposure control, adjustment of the mA and/or kV according to patient size and/or use of iterative reconstruction technique. CONTRAST:  75mL OMNIPAQUE  IOHEXOL  350 MG/ML SOLN COMPARISON:  AP Lat chest today, portable chest 07/12/2022, and cardiac partial chest CT with contrast 09/19/2022. 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 Moderate pericardial fluid is seen new from 09/19/2022, maximum thickness 2 cm. The fluid is low in density, 18 Hounsfield units and not suspicious for pericardial hemorrhage. The pulmonary arteries are well opacified, normal caliber and clear through the segmental divisions. The subsegmental arteries are not well seen due to respiratory motion. They're normal where visible. The aorta and great vessels are normal. The pulmonary veins are normal in caliber. There is mild cardiomegaly, seen previously. No visible coronary calcification. Mediastinum/Nodes: No enlarged mediastinal, hilar, or axillary lymph nodes. The lower poles of the thyroid  gland, trachea, and esophagus demonstrate no significant findings. Lungs/Pleura: There is diffuse bronchial thickening. There is increased left lower lobe infrahilar opacity concerning for pneumonia or aspiration. The central airways are clear. Mild chronic elevation right hemidiaphragm. Linear scar-like opacities symmetrically bright apex, outer right upper lobe. Again noted is a 3 mm subpleural right middle lobe solid nodule on 6:56, and 2 adjacent 4 mm right lower lobe superior segment nodules on 6:65, all unchanged. No follow-up imaging is recommended. Benign process  given length of stability. The lungs are otherwise clear. Upper Abdomen: No acute abnormality. Musculoskeletal: Slight thoracic levoscoliosis. No acute or significant osseous findings. No chest wall mass is seen.  Review of the MIP images confirms the above findings. IMPRESSION: 1. No evidence of arterial dilatation or embolus through the segmental divisions. The subsegmental arteries are not well seen due to respiratory motion. 2. Moderate pericardial effusion, new from 09/19/2022. 3. 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 4. Diffuse bronchial thickening consistent with bronchitis or reactive airways disease. 5. Increased left lower lobe infrahilar opacity concerning for pneumonia or aspiration. Electronically Signed   By: Francis Quam M.D.   On: 03/16/2024 07:22   DG Chest 2 View Result Date: 03/16/2024 CLINICAL DATA:  Heart palpitations and chest discomfort. EXAM: CHEST - 2 VIEW COMPARISON:  AP chest portable 07/12/2022. FINDINGS: There is mild cardiomegaly. No vascular congestion is seen. No edema or pleural collections. The lungs are clear of infiltrates. The mediastinum is normally outlined. Thoracic cage is intact with slight thoracic levoscoliosis. Multiple superimposed telemetry leads. IMPRESSION: Mild cardiomegaly. No acute chest findings. Electronically Signed   By: Francis Quam M.D.   On: 03/16/2024 03:02    Recent Labs: Lab Results  Component Value Date   WBC 4.9 03/21/2024   HGB 11.4 (L) 03/21/2024   PLT 316 03/21/2024   NA 136 03/21/2024   K 4.9 03/21/2024   CL 103 03/21/2024   CO2 25 03/21/2024   GLUCOSE 95 03/21/2024   BUN 11 03/21/2024   CREATININE 0.59 03/21/2024   BILITOT 0.8 03/16/2024   ALKPHOS 46 03/16/2024   AST 23 03/16/2024   ALT 12 03/16/2024   PROT 6.8 03/16/2024   ALBUMIN 3.0 (L) 03/16/2024   CALCIUM 9.0 03/21/2024   GFRAA >60 03/28/2017    Speciality Comments: Actemra IV started 04/09/24  Procedures:  No procedures  performed Allergies: Penicillins and Folic acid    Assessment / Plan:     Visit Diagnoses: Systemic sclerosis (HCC) Chronic scleroderma causing joint stiffness and contractures. Joint mobility problems a major complaint but skin disease appears somewhat worsening.  Especially with the hand pain associated from her finger tightness as well as getting some very minor scabbing lesions. Now with diagnosed pulmonary hypertension and pericardial effusion as well with plan for cardiology f/u can determine need for any other antihypertensive or vasodilator medications. - Continue physical therapy for mobility and joint function, pending cardiology f/u for clearance - Continue actemra infusion 4 mg/kg IV monthly - Recommend plan to decrease methotrexate  dose or switch to [place] route again for side effects if no other cause of current GI symptoms identified  Raynaud's phenomenon without gangrene - amlodipine  2.5 mg  Nausea and vomiting Symptoms may be as simple as slightly delayed methotrexate  GI irritation but they are concerning because she does not have the symptoms on a weekly basis and there is associated palpitations and orthostatic symptoms.  No other sick contacts no fever no abdominal pain no lower GI symptoms to suggest infectious etiology.  I would have some concern especially given the recent hospitalization and new cardiac involvement that this needs to get ruled out or even just getting some IV fluids to prevent risk of dehydration if symptoms are persisting. - Recommend going to ED today for evaluation for cardiac issues to rule out cardiac cause of symptoms (EKG/possible TTE).  Generalized drug eruption (rash) Rash possibly medication-related, atypical for scleroderma. Appears to be in resolving phase so unsure if had an exposure at hospital vs any new medication.  Some benefit with Benadryl  cream. - Continue Benadryl  cream application. - Consider topical steroid if rash persists.  Orders: No orders of the defined types were placed in this encounter.  No orders of the defined types were placed in this encounter.    Follow-Up Instructions: No follow-ups on file.   Lonni LELON Ester, MD  Note - This record has been created using [employer].  Chart creation errors have been sought, but may not always  have been located. Such creation errors do not reflect on  the standard of medical care.

## 2024-04-09 ENCOUNTER — Encounter: Attending: Internal Medicine | Admitting: Internal Medicine

## 2024-04-09 ENCOUNTER — Other Ambulatory Visit: Payer: Self-pay | Admitting: Emergency Medicine

## 2024-04-09 VITALS — BP 111/70 | HR 84 | Temp 97.4°F | Resp 14

## 2024-04-09 DIAGNOSIS — R112 Nausea with vomiting, unspecified: Secondary | ICD-10-CM | POA: Insufficient documentation

## 2024-04-09 DIAGNOSIS — M25552 Pain in left hip: Secondary | ICD-10-CM | POA: Diagnosis present

## 2024-04-09 DIAGNOSIS — I7300 Raynaud's syndrome without gangrene: Secondary | ICD-10-CM | POA: Diagnosis present

## 2024-04-09 DIAGNOSIS — M349 Systemic sclerosis, unspecified: Secondary | ICD-10-CM | POA: Diagnosis present

## 2024-04-09 DIAGNOSIS — M0600 Rheumatoid arthritis without rheumatoid factor, unspecified site: Secondary | ICD-10-CM | POA: Diagnosis present

## 2024-04-09 DIAGNOSIS — Z79899 Other long term (current) drug therapy: Secondary | ICD-10-CM | POA: Diagnosis present

## 2024-04-09 DIAGNOSIS — Z111 Encounter for screening for respiratory tuberculosis: Secondary | ICD-10-CM | POA: Insufficient documentation

## 2024-04-09 DIAGNOSIS — Z5181 Encounter for therapeutic drug level monitoring: Secondary | ICD-10-CM | POA: Diagnosis present

## 2024-04-09 MED ORDER — ACETAMINOPHEN 325 MG PO TABS
650.0000 mg | ORAL_TABLET | Freq: Once | ORAL | Status: AC
  Administered 2024-04-09: 650 mg via ORAL

## 2024-04-09 MED ORDER — DIPHENHYDRAMINE HCL 25 MG PO CAPS
25.0000 mg | ORAL_CAPSULE | Freq: Once | ORAL | Status: AC
  Administered 2024-04-09: 25 mg via ORAL

## 2024-04-09 MED ORDER — TOCILIZUMAB 400 MG/20ML IV SOLN
200.0000 mg | Freq: Once | INTRAVENOUS | Status: AC
  Administered 2024-04-09: 200 mg via INTRAVENOUS
  Filled 2024-04-09: qty 10

## 2024-04-09 NOTE — Progress Notes (Signed)
 Cancelled the TB lab order per dr Jeannetta MD request with [facility].  Spoke to [facility] in [facility] to cancel this order. It was negative on 03/15/24 with her PCP.

## 2024-04-09 NOTE — Progress Notes (Signed)
 Diagnosis: Rheumatoid Arthritis  Provider:  Jeannetta Roads MD  Procedure: IV Infusion  IV Type: Peripheral, IV Location: R Forearm  Actemra (Tocilizumab), Dose: 200 mg  Infusion Start Time: 1120  Infusion Stop Time: 1221  Post Infusion IV Care: Observation period completed  Discharge: Condition: Good, Destination: Home . AVS Provided  Performed by:  Blanca Selinda SAUNDERS, LPN

## 2024-04-10 NOTE — Addendum Note (Signed)
 Addended by: DAYNE SHERRY RAMAN on: 04/10/2024 01:54 PM   Modules accepted: Orders

## 2024-04-12 ENCOUNTER — Encounter: Admitting: Internal Medicine

## 2024-04-12 ENCOUNTER — Emergency Department (HOSPITAL_COMMUNITY)
Admission: EM | Admit: 2024-04-12 | Discharge: 2024-04-12 | Disposition: A | Discharge status: Home / Self Care | Source: Ambulatory Visit | Attending: Emergency Medicine | Admitting: Emergency Medicine

## 2024-04-12 ENCOUNTER — Encounter: Payer: Self-pay | Admitting: Internal Medicine

## 2024-04-12 ENCOUNTER — Other Ambulatory Visit: Payer: Self-pay

## 2024-04-12 ENCOUNTER — Emergency Department (HOSPITAL_COMMUNITY)

## 2024-04-12 ENCOUNTER — Encounter (HOSPITAL_COMMUNITY): Payer: Self-pay | Admitting: Emergency Medicine

## 2024-04-12 VITALS — BP 121/82 | HR 77 | Temp 98.0°F | Resp 16 | Ht 65.0 in | Wt 109.4 lb

## 2024-04-12 DIAGNOSIS — R112 Nausea with vomiting, unspecified: Secondary | ICD-10-CM | POA: Insufficient documentation

## 2024-04-12 DIAGNOSIS — M0600 Rheumatoid arthritis without rheumatoid factor, unspecified site: Secondary | ICD-10-CM | POA: Diagnosis not present

## 2024-04-12 DIAGNOSIS — Z79899 Other long term (current) drug therapy: Secondary | ICD-10-CM | POA: Diagnosis not present

## 2024-04-12 DIAGNOSIS — M349 Systemic sclerosis, unspecified: Secondary | ICD-10-CM | POA: Diagnosis not present

## 2024-04-12 DIAGNOSIS — I7300 Raynaud's syndrome without gangrene: Secondary | ICD-10-CM

## 2024-04-12 DIAGNOSIS — R002 Palpitations: Secondary | ICD-10-CM | POA: Insufficient documentation

## 2024-04-12 DIAGNOSIS — M25552 Pain in left hip: Secondary | ICD-10-CM

## 2024-04-12 LAB — COMPREHENSIVE METABOLIC PANEL WITH GFR
ALT: 10 U/L (ref 0–44)
AST: 29 U/L (ref 15–41)
Albumin: 3.8 g/dL (ref 3.5–5.0)
Alkaline Phosphatase: 50 U/L (ref 38–126)
Anion gap: 9 (ref 5–15)
BUN: 10 mg/dL (ref 6–20)
CO2: 26 mmol/L (ref 22–32)
Calcium: 9.2 mg/dL (ref 8.9–10.3)
Chloride: 105 mmol/L (ref 98–111)
Creatinine, Ser: 0.34 mg/dL — ABNORMAL LOW (ref 0.44–1.00)
GFR, Estimated: >60 mL/min (ref >60)
Glucose, Bld: 79 mg/dL (ref 70–99)
Potassium: 3.8 mmol/L (ref 3.5–5.1)
Sodium: 140 mmol/L (ref 135–145)
Total Bilirubin: 0.7 mg/dL (ref 0.0–1.2)
Total Protein: 7.7 g/dL (ref 6.5–8.1)

## 2024-04-12 LAB — CBC
HCT: 37.8 % (ref 36.0–46.0)
Hemoglobin: 11.7 g/dL — ABNORMAL LOW (ref 12.0–15.0)
MCH: 27.9 pg (ref 26.0–34.0)
MCHC: 31 g/dL (ref 30.0–36.0)
MCV: 90 fL (ref 80.0–100.0)
Platelets: 236 K/uL (ref 150–400)
RBC: 4.2 MIL/uL (ref 3.87–5.11)
RDW: 15.9 % — ABNORMAL HIGH (ref 11.5–15.5)
WBC: 3.7 K/uL — ABNORMAL LOW (ref 4.0–10.5)
nRBC: 0 % (ref 0.0–0.2)

## 2024-04-12 LAB — TROPONIN T, HIGH SENSITIVITY
Troponin T High Sensitivity: 56 ng/L — ABNORMAL HIGH (ref 0–19)
Troponin T High Sensitivity: 51 ng/L — ABNORMAL HIGH (ref 0–19)

## 2024-04-12 LAB — LIPASE, BLOOD: Lipase: 17 U/L (ref 11–51)

## 2024-04-12 MED ORDER — ONDANSETRON 4 MG PO TBDP
4.0000 mg | ORAL_TABLET | Freq: Once | ORAL | Status: AC
  Administered 2024-04-12: 4 mg via ORAL
  Filled 2024-04-12: qty 1

## 2024-04-12 MED ORDER — ONDANSETRON HCL 4 MG PO TABS: 4.0000 mg | ORAL_TABLET | Freq: Four times a day (QID) | ORAL | 0 refills | Status: AC

## 2024-04-12 NOTE — ED Triage Notes (Signed)
 Pt says she was sent by Dr.Rice (see his note in chart) for emesis and over all not feeling well since last night.

## 2024-04-12 NOTE — ED Notes (Signed)
 Attempted x 1 without success for IV access

## 2024-04-12 NOTE — ED Notes (Signed)
 Dr. Eloise Harman at bedside

## 2024-04-12 NOTE — ED Notes (Signed)
 ED Provider at bedside.

## 2024-04-12 NOTE — ED Provider Notes (Cosign Needed Addendum)
 [place] EMERGENCY DEPARTMENT AT [facility] Provider Note   CSN: [id number] Arrival date & time: 04/12/24  1235     Patient presents with: Emesis   Erin Higgins is a 53 y.o. female with a history significant for systemic sclerosis, RA, under the care of Dr. Jeannetta with rheumatology, last received methotrexate  dose 2 days ago presenting for nausea and vomiting which started this morning.  She had an appointment with Dr. Jeannetta this morning, she had been discharged from this hospital on September 25 where she was discovered to have a new pericardial effusion felt to be secondary to her scleroderma.  She underwent a pericardiocentesis and plan was to follow-up with cardiology in 1 month and repeat a 2D echo at that time.  She denies shortness of breath, denies chest pain but does endorse sensation of palpation which has been chronic and constant and current despite normal sinus rhythm on today's monitor.  She had 2 episodes of emesis prior to arrival.  She currently denies any symptoms, states her symptoms improved after vomiting.  No abdominal pain, no fevers or chills.  She does states she occasionally gets nauseated after methotrexate  infusions.  She has had no known exposures to others with flulike symptoms.   The history is provided by the patient.       Prior to Admission medications   Medication Sig Start Date End Date Taking? Authorizing Provider  ondansetron  (ZOFRAN ) 4 MG tablet Take 1 tablet (4 mg total) by mouth every 6 (six) hours. 04/12/24  Yes Adger Cantera, PA-C  amLODipine  (NORVASC ) 2.5 MG tablet TAKE 1 TABLET(2.5 MG) BY MOUTH DAILY 10/14/22   Mallipeddi, Vishnu P, MD  busPIRone (BUSPAR) 5 MG tablet Take 5 mg by mouth 2 (two) times daily as needed. for anxiety 03/27/24   [provider]  diphenhydrAMINE -zinc  acetate (BENADRYL ) cream Apply topically 2 (two) times daily as needed for itching. 03/21/24   Odell Celinda Balo, MD  famotidine  (PEPCID ) 20 MG tablet  Take 1 tablet by mouth daily. 07/25/23   [provider]  folic acid  (FOLVITE ) 1 MG tablet Take 1 tablet (1 mg total) by mouth daily. Patient not taking: Reported on 04/12/2024 03/21/24 03/21/25  Odell Celinda Balo, MD  liothyronine (CYTOMEL) 5 MCG tablet Take 5 mcg by mouth daily. Patient not taking: Reported on 04/12/2024    [provider]  methotrexate  (RHEUMATREX) 2.5 MG tablet Take 8 tablets (20 mg total) by mouth once a week. 12/22/23   Rice, Lonni ORN, MD  Tocilizumab (ACTEMRA IV) Inject 4 mg/kg into the vein every 28 (twenty-eight) days.    [provider]  traMADol  (ULTRAM ) 50 MG tablet as needed. 03/27/24   [provider]  traZODone  (DESYREL ) 50 MG tablet Take 0.5 tablets (25 mg total) by mouth at bedtime as needed for sleep. 03/21/24   Odell Celinda Balo, MD    Allergies: Penicillins and Folic acid     Review of Systems  Constitutional:  Negative for chills and fever.  HENT:  Negative for congestion and sore throat.   Eyes: Negative.   Respiratory:  Negative for chest tightness and shortness of breath.   Cardiovascular:  Positive for palpitations. Negative for chest pain.  Gastrointestinal:  Positive for nausea and vomiting. Negative for abdominal pain.  Genitourinary: Negative.   Musculoskeletal:  Negative for arthralgias, joint swelling and neck pain.  Skin: Negative.  Negative for rash and wound.  Neurological:  Negative for dizziness, weakness, light-headedness, numbness and headaches.  Psychiatric/Behavioral: Negative.  Updated Vital Signs BP 130/78   Pulse 78   Temp 98.4 F (36.9 C) (Oral)   Resp (!) 21   Ht 5' 5 (1.651 m)   Wt 49 kg   SpO2 100%   BMI 17.98 kg/m   Physical Exam Vitals and nursing note reviewed.  Constitutional:      Appearance: She is well-developed.  HENT:     Head: Normocephalic and atraumatic.  Eyes:     Conjunctiva/sclera: Conjunctivae normal.  Cardiovascular:     Rate and Rhythm: Normal  rate and regular rhythm.     Heart sounds: Normal heart sounds. No murmur heard.    No friction rub.  Pulmonary:     Effort: Pulmonary effort is normal.     Breath sounds: Normal breath sounds. No wheezing.  Abdominal:     General: Bowel sounds are normal.     Palpations: Abdomen is soft.     Tenderness: There is no abdominal tenderness.  Musculoskeletal:        General: Normal range of motion.     Cervical back: Normal range of motion.  Skin:    General: Skin is warm and dry.  Neurological:     Mental Status: She is alert.     (all labs ordered are listed, but only abnormal results are displayed) Labs Reviewed  CBC - Abnormal; Notable for the following components:      Result Value   WBC 3.7 (*)    Hemoglobin 11.7 (*)    RDW 15.9 (*)    All other components within normal limits  COMPREHENSIVE METABOLIC PANEL WITH GFR - Abnormal; Notable for the following components:   Creatinine, Ser 0.34 (*)    All other components within normal limits  TROPONIN T, HIGH SENSITIVITY - Abnormal; Notable for the following components:   Troponin T High Sensitivity 56 (*)    All other components within normal limits  TROPONIN T, HIGH SENSITIVITY - Abnormal; Notable for the following components:   Troponin T High Sensitivity 51 (*)    All other components within normal limits  LIPASE, BLOOD    EKG: EKG Interpretation Date/Time:  Friday April 12 2024 13:13:28 EDT Ventricular Rate:  73 PR Interval:  223 QRS Duration:  107 QT Interval:  412 QTC Calculation: 454 R Axis:   -86  Text Interpretation: Sinus rhythm Prolonged PR interval Incomplete RBBB and LAFB Low voltage, precordial leads Consider anterior infarct Confirmed by Yolande Charleston [phone]) on 04/12/2024 2:31:25 PM  Radiology: [facility] Chest Port 1 View Result Date: 04/12/2024 CLINICAL DATA:  Nausea, emesis EXAM: PORTABLE CHEST 1 VIEW COMPARISON:  03/16/2024 FINDINGS: Single frontal view of the chest demonstrates a stable enlarged  cardiac silhouette. No acute airspace disease, effusion, or pneumothorax. No acute displaced fracture. IMPRESSION: 1. Stable enlargement of the cardiac silhouette, corresponding to the pericardial effusion seen on prior CT exam. 2. No acute airspace disease. Electronically Signed   By: Ozell Daring M.D.   On: 04/12/2024 15:00     Procedures   Medications Ordered in the ED  ondansetron  (ZOFRAN -ODT) disintegrating tablet 4 mg (has no administration in time range)                                    Medical Decision Making Patient presenting from her rheumatologist office after having 2 episodes of vomiting after waking this morning.  By the time she arrived in the ED she  was asymptomatic.  Her rheumatologist was concerned about possible complication from her recently diagnosed pericardial effusion from her admission here approximately 3 weeks ago.  She denies chest pain and shortness of breath.  She does endorse palpitations  which is constant including during this ED stay, however she has normal sinus on the monitor and EKG is stable without ectopy during this monitored stay.  Labs were obtained, her initial troponin was elevated at 56, therefore repeat troponin was completed and is reduced at 51 so reassuring.  Dr. Yolande also performed a bedside cardiac ultrasound which was reassuring for minimal if any pericardial fluid and easily compressible IVC.  She does have a formal echocardiogram scheduled for next week and she is encouraged to proceed with this study and follow-up with cardiology which was previously scheduled.    She will be prescribed Zofran  in case her nausea returns.  She has no abdominal pain, abdomen is soft nontender, she was eating a food tray at time of dc, endorses mild nausea,  zofran  given.  She takes pepcid  every day,  advised to increase to bid.  Amount and/or Complexity of Data Reviewed Labs: ordered.    Details: Labs reviewed, per above initial troponin was 56, repeat  51, her CMET is normal, WBC count on her CBC is 3.7 Radiology: ordered.    Details: Chest x-ray reviewed, unchanged cardiomegaly. ECG/medicine tests: ordered.    Details: EKG reviewed, sinus rhythm, rate 73 incomplete right bundle branch block, unchanged.  Bigeminy on prior EKG has resolved.  Risk Prescription drug management.        Final diagnoses:  Nausea and vomiting, unspecified vomiting type    ED Discharge Orders          Ordered    ondansetron  (ZOFRAN ) 4 MG tablet  Every 6 hours        04/12/24 1913               Nesiah Jump, PA-C 04/12/24 1920    Merrillyn Ackerley, PA-C 04/12/24 1926    Yolande Lamar BROCKS, MD 04/17/24 0800

## 2024-04-12 NOTE — Discharge Instructions (Addendum)
 Your lab tests today are reassuring, additionally the bedside ultrasound confirmed that you do not have a return of that fluid collection around your heart.  This does not take the place of your formal scheduled ultrasound that you have next week, make sure you keep this appointment.  Plan follow-up with the cardiologist as well. I have prescribed some nausea medicine in case your symptoms return.

## 2024-04-23 ENCOUNTER — Other Ambulatory Visit: Payer: Self-pay | Admitting: Internal Medicine

## 2024-04-23 DIAGNOSIS — M349 Systemic sclerosis, unspecified: Secondary | ICD-10-CM

## 2024-04-23 NOTE — Telephone Encounter (Signed)
 Last Fill: 12/22/2023  Labs: 04/12/2024 Creatinine 0.34 WBC 3.7 Hemoglobin 11.7 RDW 15.9   Next Visit: none of file   Last Visit: 04/12/2024  DX: Systemic sclerosis   Current Dose per office note 04/12/2024: dose not mentioned   Okay to refill Methotrexate ?

## 2024-04-24 ENCOUNTER — Ambulatory Visit (HOSPITAL_COMMUNITY)
Admission: RE | Admit: 2024-04-24 | Discharge: 2024-04-24 | Disposition: A | Discharge status: Home / Self Care | Source: Ambulatory Visit | Attending: Cardiology | Admitting: Cardiology

## 2024-04-24 ENCOUNTER — Other Ambulatory Visit: Payer: Self-pay | Admitting: Internal Medicine

## 2024-04-24 DIAGNOSIS — I3139 Other pericardial effusion (noninflammatory): Secondary | ICD-10-CM | POA: Diagnosis not present

## 2024-04-24 DIAGNOSIS — I071 Rheumatic tricuspid insufficiency: Secondary | ICD-10-CM | POA: Insufficient documentation

## 2024-04-24 LAB — ECHOCARDIOGRAM LIMITED
Area-P 1/2: 4.21 cm2
S' Lateral: 2.95 cm

## 2024-04-30 ENCOUNTER — Encounter: Payer: Self-pay | Admitting: Internal Medicine

## 2024-05-07 ENCOUNTER — Encounter: Attending: Internal Medicine | Admitting: Internal Medicine

## 2024-05-07 ENCOUNTER — Other Ambulatory Visit: Payer: Self-pay | Admitting: Emergency Medicine

## 2024-05-07 VITALS — BP 130/74 | HR 66 | Temp 97.6°F | Resp 14 | Wt 108.0 lb

## 2024-05-07 DIAGNOSIS — M349 Systemic sclerosis, unspecified: Secondary | ICD-10-CM | POA: Insufficient documentation

## 2024-05-07 DIAGNOSIS — Z5181 Encounter for therapeutic drug level monitoring: Secondary | ICD-10-CM | POA: Insufficient documentation

## 2024-05-07 DIAGNOSIS — M0600 Rheumatoid arthritis without rheumatoid factor, unspecified site: Secondary | ICD-10-CM | POA: Diagnosis present

## 2024-05-07 DIAGNOSIS — Z79899 Other long term (current) drug therapy: Secondary | ICD-10-CM | POA: Insufficient documentation

## 2024-05-07 DIAGNOSIS — Z111 Encounter for screening for respiratory tuberculosis: Secondary | ICD-10-CM | POA: Insufficient documentation

## 2024-05-07 MED ORDER — ACETAMINOPHEN 325 MG PO TABS
650.0000 mg | ORAL_TABLET | Freq: Once | ORAL | Status: AC
  Administered 2024-05-07: 650 mg via ORAL

## 2024-05-07 MED ORDER — DIPHENHYDRAMINE HCL 25 MG PO CAPS
25.0000 mg | ORAL_CAPSULE | Freq: Once | ORAL | Status: AC
  Administered 2024-05-07: 25 mg via ORAL

## 2024-05-07 MED ORDER — TOCILIZUMAB 400 MG/20ML IV SOLN
200.0000 mg | Freq: Once | INTRAVENOUS | Status: AC
  Administered 2024-05-07: 200 mg via INTRAVENOUS
  Filled 2024-05-07: qty 10

## 2024-05-07 NOTE — Progress Notes (Signed)
 Diagnosis: Seronegative RA  Provider:  Jeannetta Roads MD  Procedure: IV Infusion  IV Type: Peripheral, IV Location: Left Posterior hand  Actemra (Tocilizumab), Dose: 800 mg  Infusion Start Time: 1035  Infusion Stop Time: 1134  Post Infusion IV Care: Observation period completed  Discharge: Condition: Good, Destination: Home . AVS Provided  Performed by:  Blanca Selinda SAUNDERS, LPN

## 2024-05-08 LAB — CBC WITH DIFFERENTIAL/PLATELET
Basophils Absolute: 0 x10E3/uL (ref 0.0–0.2)
Basos: 0 %
EOS (ABSOLUTE): 0.1 x10E3/uL (ref 0.0–0.4)
Eos: 2 %
Hematocrit: 33.6 % — ABNORMAL LOW (ref 34.0–46.6)
Hemoglobin: 10.7 g/dL — ABNORMAL LOW (ref 11.1–15.9)
Immature Grans (Abs): 0 x10E3/uL (ref 0.0–0.1)
Immature Granulocytes: 0 %
Lymphocytes Absolute: 1.8 x10E3/uL (ref 0.7–3.1)
Lymphs: 33 %
MCH: 28.5 pg (ref 26.6–33.0)
MCHC: 31.8 g/dL (ref 31.5–35.7)
MCV: 90 fL (ref 79–97)
Monocytes Absolute: 0.6 x10E3/uL (ref 0.1–0.9)
Monocytes: 11 %
Neutrophils Absolute: 2.8 x10E3/uL (ref 1.4–7.0)
Neutrophils: 54 %
Platelets: 259 x10E3/uL (ref 150–450)
RBC: 3.75 x10E6/uL — ABNORMAL LOW (ref 3.77–5.28)
RDW: 15.4 % (ref 11.7–15.4)
WBC: 5.3 x10E3/uL (ref 3.4–10.8)

## 2024-05-08 LAB — COMPREHENSIVE METABOLIC PANEL WITH GFR
ALT: 11 IU/L (ref 0–32)
AST: 25 IU/L (ref 0–40)
Albumin: 4.1 g/dL (ref 3.8–4.9)
Alkaline Phosphatase: 65 IU/L (ref 49–135)
BUN/Creatinine Ratio: 17 (ref 9–23)
BUN: 7 mg/dL (ref 6–24)
Bilirubin Total: 0.9 mg/dL (ref 0.0–1.2)
CO2: 21 mmol/L (ref 20–29)
Calcium: 9.5 mg/dL (ref 8.7–10.2)
Chloride: 103 mmol/L (ref 96–106)
Creatinine, Ser: 0.42 mg/dL — ABNORMAL LOW (ref 0.57–1.00)
Globulin, Total: 3.9 g/dL (ref 1.5–4.5)
Glucose: 79 mg/dL (ref 70–99)
Potassium: 4.3 mmol/L (ref 3.5–5.2)
Sodium: 140 mmol/L (ref 134–144)
Total Protein: 8 g/dL (ref 6.0–8.5)
eGFR: 118 mL/min/1.73 (ref >59)

## 2024-05-08 LAB — LIPID PANEL
Chol/HDL Ratio: 3.2 ratio (ref 0.0–4.4)
Cholesterol, Total: 161 mg/dL (ref 100–199)
HDL: 51 mg/dL (ref >39)
LDL Chol Calc (NIH): 94 mg/dL (ref 0–99)
Triglycerides: 84 mg/dL (ref 0–149)
VLDL Cholesterol Cal: 16 mg/dL (ref 5–40)

## 2024-05-09 ENCOUNTER — Ambulatory Visit: Payer: Self-pay | Admitting: Internal Medicine

## 2024-05-14 ENCOUNTER — Other Ambulatory Visit (HOSPITAL_COMMUNITY)
Admission: RE | Admit: 2024-05-14 | Discharge: 2024-05-14 | Disposition: A | Discharge status: Home / Self Care | Source: Ambulatory Visit | Attending: Internal Medicine | Admitting: Internal Medicine

## 2024-05-14 ENCOUNTER — Ambulatory Visit: Admitting: Internal Medicine

## 2024-05-14 ENCOUNTER — Ambulatory Visit (INDEPENDENT_AMBULATORY_CARE_PROVIDER_SITE_OTHER): Admitting: Internal Medicine

## 2024-05-14 ENCOUNTER — Encounter: Payer: Self-pay | Admitting: Internal Medicine

## 2024-05-14 VITALS — BP 152/80 | HR 64 | Ht 65.0 in | Wt 110.2 lb

## 2024-05-14 DIAGNOSIS — I3139 Other pericardial effusion (noninflammatory): Secondary | ICD-10-CM | POA: Diagnosis not present

## 2024-05-14 LAB — SEDIMENTATION RATE: Sed Rate: 8 mm/h (ref 0–30)

## 2024-05-14 LAB — C-REACTIVE PROTEIN: CRP: 0.5 mg/dL (ref <1.0)

## 2024-05-14 MED ORDER — PREDNISONE 5 MG PO TABS: ORAL_TABLET | ORAL | 0 refills | Status: DC

## 2024-05-14 NOTE — Progress Notes (Unsigned)
 Cardiology Office Note  Date: 05/14/2024   ID: [name], DOB [birth year], MRN [id number]  PCP:  Erin Norleen PEDLAR, MD  Cardiologist:  Diannah SHAUNNA Maywood, MD Electrophysiologist:  None   History of Present Illness: Erin Higgins is a 53 y.o. female  Past Medical History:  Diagnosis Date   Diverticulitis    Heartburn    Herpes simplex of female genitalia    Scleredema (HCC)    Tapeworm 04/2023    Past Surgical History:  Procedure Laterality Date   COLONOSCOPY     DILATION AND CURETTAGE OF UTERUS     PERICARDIOCENTESIS N/A 03/19/2024   Procedure: PERICARDIOCENTESIS;  Surgeon: Ladona Heinz, MD;  Location: [facility] INVASIVE CV LAB;  Service: Cardiovascular;  Laterality: N/A;   RIGHT HEART CATH N/A 03/19/2024   Procedure: RIGHT HEART CATH;  Surgeon: Ladona Heinz, MD;  Location: [facility] INVASIVE CV LAB;  Service: Cardiovascular;  Laterality: N/A;   TOOTH EXTRACTION  08/2023   TUBAL LIGATION  05/03/2012   Procedure: POST PARTUM TUBAL LIGATION;  Surgeon: Elveria Mungo, MD;  Location: [facility];  Service: Gynecology;  Laterality: Bilateral;  with filshie clips    Current Outpatient Medications  Medication Sig Dispense Refill   amLODipine  (NORVASC ) 2.5 MG tablet TAKE 1 TABLET(2.5 MG) BY MOUTH DAILY 90 tablet 1   busPIRone (BUSPAR) 5 MG tablet Take 5 mg by mouth 2 (two) times daily as needed. for anxiety     diphenhydrAMINE -zinc  acetate (BENADRYL ) cream Apply topically 2 (two) times daily as needed for itching. 28.4 g 0   famotidine  (PEPCID ) 20 MG tablet Take 1 tablet by mouth daily.     methotrexate  (RHEUMATREX) 2.5 MG tablet TAKE EIGHT TABLETS BY MOUTH ONCE A WEEK 96 tablet 0   ondansetron  (ZOFRAN ) 4 MG tablet Take 1 tablet (4 mg total) by mouth every 6 (six) hours. 12 tablet 0   predniSONE (DELTASONE) 20 MG tablet Take 20 mg by mouth daily. Will be finished this Thursday.     Tocilizumab (ACTEMRA IV) Inject 4 mg/kg into the vein every 28 (twenty-eight) days.     traMADol  (ULTRAM )  50 MG tablet as needed.     traZODone  (DESYREL ) 50 MG tablet Take 0.5 tablets (25 mg total) by mouth at bedtime as needed for sleep. 30 tablet 1   No current facility-administered medications for this visit.   Allergies:  Penicillins and Folic acid    Social History: The patient  reports that she has never smoked. She has been exposed to tobacco smoke. She has never used smokeless tobacco. She reports that she does not currently use alcohol after a past usage of about 1.0 standard drink of alcohol per week. She reports that she does not use drugs.   Family History: The patient's family history includes Breast cancer in her mother; Diabetes type II in her mother; Hyperlipidemia in her mother; Hypertension in her father and mother; Lupus in her paternal aunt; Sleep apnea in her mother; Ulcerative colitis in her father; Vitiligo in her paternal great-grandmother.   ROS:  Please see the history of present illness. Otherwise, complete review of systems is positive for none.  All other systems are reviewed and negative.   Physical Exam: VS:  BP (!) 152/80   Pulse 64   Ht 5' 5 (1.651 m)   Wt 110 lb 3.2 oz (50 kg)   BMI 18.34 kg/m , BMI Body mass index is 18.34 kg/m.  Wt Readings from Last 3 Encounters:  05/14/24 110 lb 3.2  oz (50 kg)  05/07/24 108 lb (49 kg)  04/12/24 108 lb 0.4 oz (49 kg)    General: Patient appears comfortable at rest. HEENT: Conjunctiva and lids normal, oropharynx clear with moist mucosa. Neck: Supple, no elevated JVP or carotid bruits, no thyromegaly. Lungs: Clear to auscultation, nonlabored breathing at rest. Cardiac: Regular rate and rhythm, no S3 or significant systolic murmur, no pericardial rub. Abdomen: Soft, nontender, no hepatomegaly, bowel sounds present, no guarding or rebound. Extremities: No pitting edema, distal pulses 2+. Skin: Warm and dry. Musculoskeletal: No kyphosis. Neuropsychiatric: Alert and oriented x3, affect grossly appropriate.  Recent  Labwork: 03/19/2024: TSH 5.645 03/21/2024: Magnesium  2.5 05/07/2024: ALT 11; AST 25; BUN 7; Creatinine, Ser 0.42; Hemoglobin 10.7; Platelets 259; Potassium 4.3; Sodium 140     Component Value Date/Time   CHOL 161 05/07/2024 1000   TRIG 84 05/07/2024 1000   HDL 51 05/07/2024 1000   CHOLHDL 3.2 05/07/2024 1000   CHOLHDL 3.4 09/21/2023 1106   LDLCALC 94 05/07/2024 1000   LDLCALC 102 (H) 09/21/2023 1106     Assessment and Plan:      Medication Adjustments/Labs and Tests Ordered: Current medicines are reviewed at length with the patient today.  Concerns regarding medicines are outlined above.    Disposition:  Follow up   Signed, Jericho Cieslik Arleta Maywood, MD, 05/14/2024 10:41 AM    [facility] at [facility] [address] [place], [place] [address]

## 2024-05-14 NOTE — Telephone Encounter (Addendum)
 Hi Dr. Mallipeddi!   There is a major interaction between colchicine and Acterma. Acterma can decrease the efficacy of colchicine by lowering its concentration. One option could continue colchicine and monitor closely to ensure efficacy. Alternatively, consider different therapy for pericarditis like high dose NSAID (such as naproxen  500 mg q12h).   Hope this helps! Please let me know if you have any other questions.    I agree with Romuald Mccaslin's response  ETTER Knock - PharmD)

## 2024-05-14 NOTE — Patient Instructions (Addendum)
 Medication Instructions:  Your physician has recommended you make the following change in your medication:   -Take Prednisone 20 mg (4 tablets) by mouth for 7 days, THEN take prednisone 15 mg (3 tablets) by mouth for 14 days, THEN take prednisone 10 mg (2 tablets) by mouth for 14 days, THEN take prednisone 5 mg (1 tablet) by mouth for 14 days, THEN stop.   *If you need a refill on your cardiac medications before your next appointment, please call your pharmacy*  Lab Work: ESR CRP  If you have labs (blood work) drawn today and your tests are completely normal, you will receive your results only by: MyChart Message (if you have MyChart) OR A paper copy in the mail If you have any lab test that is abnormal or we need to change your treatment, we will call you to review the results.  Testing/Procedures: Your physician has requested that you have an echocardiogram. Echocardiography is a painless test that uses sound waves to create images of your heart. It provides your doctor with information about the size and shape of your heart and how well your heart's chambers and valves are working. This procedure takes approximately one hour. There are no restrictions for this procedure. Please do NOT wear cologne, perfume, aftershave, or lotions (deodorant is allowed). Please arrive 15 minutes prior to your appointment time.  Please note: We ask at that you not bring children with you during ultrasound (echo/ vascular) testing. Due to room size and safety concerns, children are not allowed in the ultrasound rooms during exams. Our front office staff cannot provide observation of children in our lobby area while testing is being conducted. An adult accompanying a patient to their appointment will only be allowed in the ultrasound room at the discretion of the ultrasound technician under special circumstances. We apologize for any inconvenience.   Follow-Up: At [facility], you and your health  needs are our priority.  As part of our continuing mission to provide you with exceptional heart care, our providers are all part of one team.  This team includes your primary Cardiologist (physician) and Advanced Practice Providers or APPs (Physician Assistants and Nurse Practitioners) who all work together to provide you with the care you need, when you need it.  Your next appointment:   1 month(s)  Provider:   You may see Vishnu P Mallipeddi, MD or one of the following Advanced Practice Providers on your designated Care Team:   Brittany Strader, PA-C  Scotesia [place]os, [place] Olivia Pavy, [place]     We recommend signing up for the patient portal called MyChart.  Sign up information is provided on this After Visit Summary.  MyChart is used to connect with patients for Virtual Visits (Telemedicine).  Patients are able to view lab/test results, encounter notes, upcoming appointments, etc.  Non-urgent messages can be sent to your provider as well.   To learn more about what you can do with MyChart, go to [email or web address].   Other Instructions

## 2024-05-19 NOTE — Therapy (Signed)
 OUTPATIENT PHYSICAL THERAPY LOWER EXTREMITY EVALUATION   Patient Name: Erin Higgins MRN: [id number] DOB:May 25, 1971, 53 y.o., female Today's Date: 05/21/2024  END OF SESSION:  PT End of Session - 05/20/24 1039     Visit Number 1    Number of Visits 16    Date for Recertification  07/15/24    Authorization Type MCD    PT Start Time 1034    PT Stop Time 1119    PT Time Calculation (min) 45 min    Activity Tolerance Patient tolerated treatment well    Behavior During Therapy [facility] for tasks assessed/performed          Past Medical History:  Diagnosis Date   Diverticulitis    Heartburn    Herpes simplex of female genitalia    Scleredema (HCC)    Tapeworm 04/2023   Past Surgical History:  Procedure Laterality Date   COLONOSCOPY     DILATION AND CURETTAGE OF UTERUS     PERICARDIOCENTESIS N/A 03/19/2024   Procedure: PERICARDIOCENTESIS;  Surgeon: Ladona Heinz, MD;  Location: [facility] INVASIVE CV LAB;  Service: Cardiovascular;  Laterality: N/A;   RIGHT HEART CATH N/A 03/19/2024   Procedure: RIGHT HEART CATH;  Surgeon: Ladona Heinz, MD;  Location: [facility] INVASIVE CV LAB;  Service: Cardiovascular;  Laterality: N/A;   TOOTH EXTRACTION  08/2023   TUBAL LIGATION  05/03/2012   Procedure: POST PARTUM TUBAL LIGATION;  Surgeon: Elveria Mungo, MD;  Location: [facility];  Service: Gynecology;  Laterality: Bilateral;  with filshie clips   Patient Active Problem List   Diagnosis Date Noted   Nausea and vomiting 04/12/2024   High risk medication use 03/28/2024   Screening for tuberculosis 03/28/2024   Medication monitoring encounter 03/28/2024   Pericardial effusion 03/16/2024   Hypokalemia 03/16/2024   Hypomagnesemia 03/16/2024   Bronchitis 03/16/2024   Seronegative rheumatoid arthritis (HCC) 02/15/2024   Herpes simplex of female genitalia    Systemic sclerosis (HCC) 01/18/2023   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  Pt had a pericardiocentesis on 03/19/2024  and was discharged from hospital on 03/21/24.  Pt saw her rheumatologist on 04/12/24.  Pt was having vomiting and MD recommended pt going to the ED for evaluation of cardiac issues.  Pt  went to ED that same day and had an EKG, chest x ray, blood work, and US .  They told her everything was fine, but wanted pt to get an echocardiogram.  Pt thinks the vomiting was from the methotrexate .  Pt had a limited echocardiogram on 04/24/2024 showing moderate pericardial effusion, increased drainage compared to prior.  Pt has seen her PCP who prescribed prednisone  for a small lump around her eye.  Pt saw cardiology on 05/14/24 and she instructed pt to continue with prednisone  at a decreased dosage to help with the fluid.  Pt states the other labs came back good.  She ordered another echocardiogram.  Pt states she was cleared to start PT.   Pt denies any sx's currently including cardiac palpitations,  SOB, vomiting, and chest pain.  Pt is concerned about her mobility.  Pt has difficulty getting up from surfaces.  Pt reports tightness in her legs and arms and it's her skin from the scleroderma.  Pt reports she has weakness in her arms and legs.  Pt states she has difficulty rolling over in bed.  Everyday is different.  Pt states somedays she doesn't feel like getting OOB except to go to the B/R and other days she has more energy.  Pt is limited with ambulation. She uses a motorized cart when grocery shopping or walks and pushes the shopping  cart.  Pt is limited with household chores. Pt has issues with her hands and is unable to cook.  Pt is limited with work activities. Pt is limited with bending.  Pt requires assistance with dressing.    Pt has recently received PT from 05/25 to 09/25 for frequent falls.  Pt states she feels like PT helped, though wasn't enough.  Pt has been performing her HEP.     Pt received OT for hands which helped.   PERTINENT HISTORY: Systemic sclerosis/scleroderma on methotrexate   pericardial effusion s/p pericardiocentesis on 03/19/2024 Raynauds phenomenon Pulmonary HTN, Cardiac palpitations, and Incomplete RBBB    PAIN:  L lateral hip:  3/10 best and current, 10/10 worst  PRECAUTIONS: Other: Systemic sclerosis, Raynaud's, pericardial effusion, Hx of falls   WEIGHT BEARING RESTRICTIONS: No  FALLS:  Has patient fallen in last 6 months? No  LIVING ENVIRONMENT: Lives with: lives with their family; mother and son live with her; also her sister is next door Lives in: House/apartment Stairs: Yes: External: 1 steps; none Has following equipment at home: Single point cane, Walker - 2 wheeled, and shower chair; needs BSC or raised toilet seat  OCCUPATION: conservation officer, nature; works with kids  PLOF: Independent  PATIENT GOALS: to improve mobility and strength   OBJECTIVE:  Note: Objective measures were completed at Evaluation unless otherwise noted.  DIAGNOSTIC FINDINGS:  limited echocardiogram on 04/24/2024 showing moderate pericardial effusion, increased drainage compared to prior.   PATIENT SURVEYS:  LEFS:  8/80  COGNITION: Overall cognitive status: Within functional limits for tasks assessed      MUSCLE LENGTH: Assess HS tightness next visit.   LOWER EXTREMITY ROM:   ROM Right eval Left eval  Hip flexion    Hip extension    Hip abduction    Hip adduction    Hip internal rotation    Hip external rotation    Knee flexion    Knee extension    Ankle dorsiflexion     Ankle plantarflexion    Ankle inversion    Ankle eversion     (Blank rows = not tested)  LOWER EXTREMITY MMT:  MMT Right eval Left eval  Hip flexion 3+/5 3+/5  Hip extension    Hip abduction <3/5, 15.4 <3/5, 19.7  Hip adduction    Hip internal rotation    Hip external rotation    Knee flexion 4/5 4-/5  Knee extension 4/5 4-/5  Ankle dorsiflexion    Ankle plantarflexion    Ankle inversion    Ankle eversion     (Blank rows = not tested)   FUNCTIONAL TESTS:  Pt has to use UE's with sit to stands.   5x STS test: 25.47 sec with Ue's  TUG: 10.64   GAIT: Assistive device utilized: None Level of assistance: Complete Independence Comments:  heel to toe gait.  Decreased hip extension bilat.  Pt has  reciprocal arm swing though maintains elbow flexed having limited elbow extension.                                                                                                                                 TREATMENT:   Reviewed HEP.  Pt states she has been performing her HEP except not performing the ball roll outs.  She doesn't have a ball.  Pt is also performing sidestepping.   PATIENT EDUCATION:  Education details: objective findings, dx, relevant anatomy, POC, and rational of interventions.  PT answered pt's questions. Person educated: Patient Education method: Explanation Education comprehension: verbalized understanding and needs further education  HOME EXERCISE PROGRAM: Pt has a prior HEP.  ASSESSMENT:  CLINICAL IMPRESSION: Patient is a 53 y.o. female with a dx of L lateral hip pain.  She has other co-morbidities including Systemic sclerosis, Raynaud's, and a pericardial effusion.  Pt was hospitalized and had a pericardiocentesis on 03/19/2024.  Pt has seen her PCP, rheumatology, and cardiology.  Pt reports she has been cleared for PT.  She is having another echocardiogram.  Pt has recently received PT from 05/25 to 09/25 for frequent falls.  Pt reports having  tightness and weakness in Ue's and LE's.  Pt has difficulty with her functional mobility skills including bed mobility, transfers, and ambulation.  Pt is limited with bending, household chores and work activities.  Pt also has issues with her hands and has received OT.  Pt requires assistance with dressing and is unable to cook.  PT performed functional testing and her TUG time has improved though 5x STS test is worse.  Pt has weakness in bilat LE as evidenced by MMT and HHD.  Pt should benefit from skilled PT to address impairments and improve overall function.        OBJECTIVE IMPAIRMENTS: Abnormal gait, decreased activity tolerance, decreased endurance, decreased mobility, difficulty walking, decreased ROM, decreased strength, hypomobility, impaired flexibility, and pain.   ACTIVITY LIMITATIONS: bending, standing, squatting, stairs, transfers, bed mobility, dressing, and locomotion level  PARTICIPATION LIMITATIONS: meal prep, cleaning, laundry, shopping, community activity, and occupation  PERSONAL FACTORS: 3+ comorbidities: Systemic sclerosis, pericardial effusion, Raynaud's phenomenon are also affecting patient's functional outcome.   REHAB POTENTIAL: Good  CLINICAL DECISION MAKING: Evolving/moderate complexity  EVALUATION COMPLEXITY: Moderate   GOALS:   SHORT TERM GOALS: Target date:  06/17/24  Pt will be able to perform 3 sit to stands without UE support with good stability.  Baseline: Goal status: INITIAL  2.  Pt will perform 5x STS test with UE support in no > than 20 sec for improved functional LE strength and performance of transfers.   Baseline:  Goal status: INITIAL  3.  Pt will report at least a 25% improvement in hip pain overall.  Baseline:  Goal status: INITIAL  4.  Pt will report increased performance and tolerance of her normal work activities.  Baseline:  Goal status: INITIAL Target  date:  06/24/2024    LONG TERM GOALS: Target date: 07/15/24  Pt will  report at least a 70% improvement in her functional mobility skills and also hip pain with daily mobility and activities.  Baseline:  Goal status: INITIAL  2.  Pt will be able to perform 5x STS test in <16 sec for improved functional LE strength and performance of transfers.  Baseline:  Goal status: INITIAL  3.  Pt will be able to ambulate in grocery store without significant pain and difficulty.  Baseline:  Goal status: INITIAL  4.  Pt's LEFS will be at least 30/80 for improved self perceived disability and function.   Baseline:  Goal status: INITIAL  5.  Pt will demo improved LE strength to 5/5 in bilat knees, 4+/5 in bilat hip flexion, and at least 10# increase in bilat hip abd for improved performance of and tolerance with functional mobility.  Baseline:  Goal status: INITIAL    PLAN:  PT FREQUENCY: 2x/week  PT DURATION: 8 weeks  PLANNED INTERVENTIONS: 97164- PT Re-evaluation, 97750- Physical Performance Testing, 97110-Therapeutic exercises, 97530- Therapeutic activity, [id number]- Neuromuscular re-education, 97535- Self Care, [id number]- Manual therapy, [phone]- Gait training, [phone]- Aquatic Therapy, [phone]- Electrical stimulation (unattended), [phone]- Electrical stimulation (manual), [id number]- Ultrasound, Patient/Family education, Balance training, Stair training, Taping, Joint mobilization, Spinal mobilization, DME instructions, and Moist heat  PLAN FOR NEXT SESSION: Assess HS tightness next visit.  LE and functional strengthening.  Perform sit to stands.     Leigh Minerva III PT, DPT 05/21/24 5:23 PM   For all possible CPT codes, reference the Planned Interventions line above.     Check all conditions that are expected to impact treatment: {Conditions expected to impact treatment:Musculoskeletal disorders   If treatment provided at initial evaluation, no treatment charged due to lack of authorization.

## 2024-05-20 ENCOUNTER — Ambulatory Visit (HOSPITAL_BASED_OUTPATIENT_CLINIC_OR_DEPARTMENT_OTHER): Attending: Internal Medicine | Admitting: Physical Therapy

## 2024-05-20 ENCOUNTER — Ambulatory Visit: Payer: Self-pay | Admitting: Internal Medicine

## 2024-05-20 DIAGNOSIS — M6281 Muscle weakness (generalized): Secondary | ICD-10-CM | POA: Diagnosis present

## 2024-05-20 DIAGNOSIS — M25552 Pain in left hip: Secondary | ICD-10-CM | POA: Diagnosis present

## 2024-05-20 DIAGNOSIS — I3139 Other pericardial effusion (noninflammatory): Secondary | ICD-10-CM

## 2024-05-20 DIAGNOSIS — R262 Difficulty in walking, not elsewhere classified: Secondary | ICD-10-CM | POA: Insufficient documentation

## 2024-05-21 ENCOUNTER — Encounter (HOSPITAL_BASED_OUTPATIENT_CLINIC_OR_DEPARTMENT_OTHER): Payer: Self-pay | Admitting: Physical Therapy

## 2024-05-22 ENCOUNTER — Ambulatory Visit (HOSPITAL_COMMUNITY)
Admission: RE | Admit: 2024-05-22 | Discharge: 2024-05-22 | Disposition: A | Discharge status: Home / Self Care | Source: Ambulatory Visit | Attending: Internal Medicine | Admitting: Internal Medicine

## 2024-05-22 DIAGNOSIS — I3139 Other pericardial effusion (noninflammatory): Secondary | ICD-10-CM | POA: Insufficient documentation

## 2024-05-22 LAB — ECHOCARDIOGRAM LIMITED: S' Lateral: 3.4 cm

## 2024-05-22 NOTE — Progress Notes (Signed)
*  PRELIMINARY RESULTS* Echocardiogram Limited 2-D Echocardiogram has been performed.  Erin Higgins 05/22/2024, 10:03 AM

## 2024-05-24 ENCOUNTER — Encounter: Payer: Self-pay | Admitting: Internal Medicine

## 2024-05-27 ENCOUNTER — Ambulatory Visit (HOSPITAL_BASED_OUTPATIENT_CLINIC_OR_DEPARTMENT_OTHER): Attending: Internal Medicine | Admitting: Physical Therapy

## 2024-05-27 ENCOUNTER — Encounter (HOSPITAL_BASED_OUTPATIENT_CLINIC_OR_DEPARTMENT_OTHER): Payer: Self-pay | Admitting: Physical Therapy

## 2024-05-27 DIAGNOSIS — M6281 Muscle weakness (generalized): Secondary | ICD-10-CM | POA: Insufficient documentation

## 2024-05-27 DIAGNOSIS — R262 Difficulty in walking, not elsewhere classified: Secondary | ICD-10-CM | POA: Insufficient documentation

## 2024-05-27 DIAGNOSIS — M25552 Pain in left hip: Secondary | ICD-10-CM | POA: Diagnosis present

## 2024-05-27 DIAGNOSIS — R296 Repeated falls: Secondary | ICD-10-CM | POA: Insufficient documentation

## 2024-05-27 NOTE — Telephone Encounter (Signed)
 Patient notified of results via MyChart. Testing ordered. Will route message to schedulers for providing appointment.

## 2024-05-27 NOTE — Therapy (Signed)
 OUTPATIENT PHYSICAL THERAPY LOWER EXTREMITY TREATMENT    Patient Name: Erin Higgins MRN: [id number] DOB:[birth year], 53 y.o., female Today's Date: 05/27/2024  END OF SESSION:  PT End of Session - 05/27/24 1204     Visit Number 2    Number of Visits 16    Date for Recertification  07/15/24    Authorization Type MCD    PT Start Time 1148    PT Stop Time 1226    PT Time Calculation (min) 38 min    Activity Tolerance Patient tolerated treatment well    Behavior During Therapy WFL for tasks assessed/performed           Past Medical History:  Diagnosis Date   Diverticulitis    Heartburn    Herpes simplex of female genitalia    Scleredema (HCC)    Tapeworm 04/2023   Past Surgical History:  Procedure Laterality Date   COLONOSCOPY     DILATION AND CURETTAGE OF UTERUS     PERICARDIOCENTESIS N/A 03/19/2024   Procedure: PERICARDIOCENTESIS;  Surgeon: Ladona Heinz, MD;  Location: [facility] INVASIVE CV LAB;  Service: Cardiovascular;  Laterality: N/A;   RIGHT HEART CATH N/A 03/19/2024   Procedure: RIGHT HEART CATH;  Surgeon: Ladona Heinz, MD;  Location: [facility] INVASIVE CV LAB;  Service: Cardiovascular;  Laterality: N/A;   TOOTH EXTRACTION  08/2023   TUBAL LIGATION  05/03/2012   Procedure: POST PARTUM TUBAL LIGATION;  Surgeon: Elveria Mungo, MD;  Location: [facility];  Service: Gynecology;  Laterality: Bilateral;  with filshie clips   Patient Active Problem List   Diagnosis Date Noted   Nausea and vomiting 04/12/2024   High risk medication use 03/28/2024   Screening for tuberculosis 03/28/2024   Medication monitoring encounter 03/28/2024   Pericardial effusion 03/16/2024   Hypokalemia 03/16/2024   Hypomagnesemia 03/16/2024   Bronchitis 03/16/2024   Seronegative rheumatoid arthritis (HCC) 02/15/2024   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 Sent a message about my rheumatologist to ask him to add upper body to PT, upper body is a big concern. Skin is tight and its hard for me to open up and move a lot. Hands are a problem especially when Raynauds kicks in.      EVAL: Pt was in the hospital from 03/16/24 to 03/21/24 and was found to have a pericardial effusion.  Pt had a pericardiocentesis on 03/19/2024  and was discharged from hospital on 03/21/24.  Pt saw her rheumatologist on 04/12/24.  Pt was having vomiting and MD recommended pt going to the ED for evaluation of cardiac issues.  Pt  went to ED that same day and had an EKG, chest x ray, blood work, and US .  They told her everything was fine, but wanted pt to get an echocardiogram.  Pt thinks the vomiting was from the methotrexate .  Pt had a limited echocardiogram on 04/24/2024 showing moderate pericardial effusion, increased drainage compared to prior.  Pt has seen her PCP who prescribed prednisone  for a small lump around her eye.  Pt saw cardiology on 05/14/24 and she instructed pt to continue with prednisone  at a decreased dosage to help with the fluid.  Pt states the other labs came back good.  She ordered another echocardiogram.  Pt states she was cleared to start PT.   Pt denies any sx's currently including cardiac palpitations, SOB, vomiting, and chest pain.  Pt is concerned about her mobility.  Pt has difficulty getting up from surfaces.  Pt reports tightness in her legs and arms and it's her skin from the scleroderma.  Pt reports she has  weakness in her arms and legs.  Pt states she has difficulty rolling over in bed.  Everyday is different.  Pt states somedays she doesn't feel like getting OOB except to go to the B/R and other days she has more energy.  Pt is limited with ambulation. She uses a motorized cart when grocery shopping or walks and pushes the shopping cart.  Pt is limited with household chores. Pt has issues with her hands and is unable to cook.  Pt is limited with work activities. Pt is limited with bending.  Pt requires assistance with dressing.    Pt has recently received PT from 05/25 to 09/25 for frequent falls.  Pt states she feels like PT helped, though wasn't enough.  Pt has been performing her HEP.     Pt received OT for hands which helped.   PERTINENT HISTORY: Systemic sclerosis/scleroderma on methotrexate   pericardial effusion s/p pericardiocentesis on 03/19/2024 Raynauds phenomenon Pulmonary HTN, Cardiac palpitations, and Incomplete RBBB    PAIN:  3-4/10, hard to explain where- its not a pain more an uncomfortable feeling mostly in arms and legs, heat and massage helps as well as ginger rub, nothing makes it feel worse besides cold   PRECAUTIONS: Other: Systemic sclerosis, Raynaud's, pericardial effusion, Hx of falls   WEIGHT BEARING RESTRICTIONS: No  FALLS:  Has patient fallen in last 6 months? No  LIVING ENVIRONMENT: Lives with: lives with their family; mother and son live with her; also her sister is next door Lives in: House/apartment Stairs: Yes: External: 1 steps; none Has following equipment at home: Single point cane, Walker - 2 wheeled, and shower chair; needs BSC or raised toilet seat  OCCUPATION: conservation officer, nature; works with kids  PLOF: Independent  PATIENT GOALS: to improve mobility and strength   OBJECTIVE:  Note: Objective measures were completed at Evaluation unless otherwise noted.  DIAGNOSTIC FINDINGS:  limited echocardiogram on 04/24/2024 showing moderate  pericardial effusion, increased drainage compared to prior.   PATIENT SURVEYS:  LEFS:  8/80  COGNITION: Overall cognitive status: Within functional limits for tasks assessed      MUSCLE LENGTH:   05/27/24- mild limitation B    LOWER EXTREMITY ROM:   ROM Right eval Left eval  Hip flexion    Hip extension    Hip abduction    Hip adduction    Hip internal rotation    Hip external rotation    Knee flexion    Knee extension    Ankle dorsiflexion    Ankle plantarflexion    Ankle inversion    Ankle eversion     (Blank rows = not tested)  LOWER EXTREMITY MMT:  MMT Right eval Left eval  Hip flexion 3+/5 3+/5  Hip extension    Hip abduction <3/5, 15.4 <3/5, 19.7  Hip adduction    Hip internal  rotation    Hip external rotation    Knee flexion 4/5 4-/5  Knee extension 4/5 4-/5  Ankle dorsiflexion    Ankle plantarflexion    Ankle inversion    Ankle eversion     (Blank rows = not tested)   FUNCTIONAL TESTS:  Pt has to use UE's with sit to stands.   5x STS test: 25.47 sec with Ue's  TUG: 10.64   GAIT: Assistive device utilized: None Level of assistance: Complete Independence Comments:  heel to toe gait.  Decreased hip extension bilat.  Pt has reciprocal arm swing though maintains elbow flexed having limited elbow extension.                                                                                                                                 TREATMENT:     05/27/24  Bridges + ABD into red TB 2x12 Sidelying clams red TB 2x12 B L stretch at counter 2x30 seconds   Nustep L3x6 minutes seat 9/arms 10   Tandem stance 3x30 seconds B  Tandem walks forward/backward by bar x4 laps Narrow BOS on blue foam EO 3x30 seconds    PATIENT EDUCATION:  Education details: objective findings, dx, relevant anatomy, POC, and rational of interventions.  PT answered pt's questions. Person educated: Patient Education method: Explanation Education comprehension:  verbalized understanding and needs further education  HOME EXERCISE PROGRAM: Pt has a prior HEP- doing sidesteps with band, hip extensions with band, hamstring curls with band, STS  ASSESSMENT:  CLINICAL IMPRESSION:  Arrives today doing OK, she still has some concerns about upper body function due to limited range of motion and skin tightness- we will need specific MD order to address this in depth, but did generally incorporate this today. Also worked on balance interventions as time allowed as she reported this is also a concern. Will continue to challenge and progress as able moving forward.     EVAL: Patient is a 53 y.o. female with a dx of L lateral hip pain.  She has other co-morbidities including Systemic sclerosis, Raynaud's, and a pericardial effusion.  Pt was hospitalized and had a pericardiocentesis on 03/19/2024.  Pt has seen her PCP, rheumatology, and cardiology.  Pt reports she has been cleared for PT.  She is having another echocardiogram.  Pt has recently received PT from 05/25 to 09/25 for frequent falls.  Pt reports having tightness and weakness in Ue's and LE's.  Pt has difficulty with her functional mobility skills including bed mobility, transfers, and ambulation.  Pt is limited with bending, household chores and work activities.  Pt also has issues with her hands and has received OT.  Pt requires assistance with dressing and is unable to cook.  PT performed functional testing and her TUG time has improved though 5x STS test is worse.  Pt has weakness in bilat LE as evidenced by MMT and HHD.  Pt should benefit from skilled  PT to address impairments and improve overall function.        OBJECTIVE IMPAIRMENTS: Abnormal gait, decreased activity tolerance, decreased endurance, decreased mobility, difficulty walking, decreased ROM, decreased strength, hypomobility, impaired flexibility, and pain.   ACTIVITY LIMITATIONS: bending, standing, squatting, stairs, transfers, bed mobility,  dressing, and locomotion level  PARTICIPATION LIMITATIONS: meal prep, cleaning, laundry, shopping, community activity, and occupation  PERSONAL FACTORS: 3+ comorbidities: Systemic sclerosis, pericardial effusion, Raynaud's phenomenon are also affecting patient's functional outcome.   REHAB POTENTIAL: Good  CLINICAL DECISION MAKING: Evolving/moderate complexity  EVALUATION COMPLEXITY: Moderate   GOALS:   SHORT TERM GOALS: Target date:  06/17/24  Pt will be able to perform 3 sit to stands without UE support with good stability.  Baseline: Goal status: INITIAL  2.  Pt will perform 5x STS test with UE support in no > than 20 sec for improved functional LE strength and performance of transfers.   Baseline:  Goal status: INITIAL  3.  Pt will report at least a 25% improvement in hip pain overall.  Baseline:  Goal status: INITIAL  4.  Pt will report increased performance and tolerance of her normal work activities.  Baseline:  Goal status: INITIAL Target date:  06/24/2024    LONG TERM GOALS: Target date: 07/15/24  Pt will report at least a 70% improvement in her functional mobility skills and also hip pain with daily mobility and activities.  Baseline:  Goal status: INITIAL  2.  Pt will be able to perform 5x STS test in <16 sec for improved functional LE strength and performance of transfers.  Baseline:  Goal status: INITIAL  3.  Pt will be able to ambulate in grocery store without significant pain and difficulty.  Baseline:  Goal status: INITIAL  4.  Pt's LEFS will be at least 30/80 for improved self perceived disability and function.   Baseline:  Goal status: INITIAL  5.  Pt will demo improved LE strength to 5/5 in bilat knees, 4+/5 in bilat hip flexion, and at least 10# increase in bilat hip abd for improved performance of and tolerance with functional mobility.  Baseline:  Goal status: INITIAL    PLAN:  PT FREQUENCY: 2x/week  PT DURATION: 8 weeks  PLANNED  INTERVENTIONS: 97164- PT Re-evaluation, 97750- Physical Performance Testing, 97110-Therapeutic exercises, 97530- Therapeutic activity, [id number]- Neuromuscular re-education, 97535- Self Care, [id number]- Manual therapy, [phone]- Gait training, [phone]- Aquatic Therapy, [phone]- Electrical stimulation (unattended), [phone]- Electrical stimulation (manual), [id number]- Ultrasound, Patient/Family education, Balance training, Stair training, Taping, Joint mobilization, Spinal mobilization, DME instructions, and Moist heat  PLAN FOR NEXT SESSION:   LE and functional strengthening. Work on balance. Any updates on MD referral for UEs?     Josette Rough, PT, DPT 05/27/24 12:26 PM   For all possible CPT codes, reference the Planned Interventions line above.     Check all conditions that are expected to impact treatment: {Conditions expected to impact treatment:Musculoskeletal disorders   If treatment provided at initial evaluation, no treatment charged due to lack of authorization.

## 2024-05-27 NOTE — Telephone Encounter (Signed)
-----   Message from Laymon CHRISTELLA Qua sent at 05/26/2024  2:39 PM EST ----- Covering for Dr. Mallipeddi - Please let the patient know the pumping function of her heart is within a normal range. Her pericardial effusion remains in a moderate range which is similar to imaging  from 03/2024 and slightly increased. Would go ahead and arrange for a follow-up limited echo at the end of this month for reassessment. She does have an in-office visit with Dr. Mallipeddi on  06/17/2024 and if felt to not be needed or too soon, can be rescheduled.  ----- Message ----- From: Interface, Three One Seven Sent: 05/22/2024  11:17 AM EST To: Vishnu P Mallipeddi, MD

## 2024-05-30 ENCOUNTER — Encounter (HOSPITAL_BASED_OUTPATIENT_CLINIC_OR_DEPARTMENT_OTHER): Admitting: Physical Therapy

## 2024-06-04 ENCOUNTER — Encounter: Attending: Internal Medicine | Admitting: *Deleted

## 2024-06-04 VITALS — BP 135/78 | HR 73 | Temp 98.1°F | Resp 16 | Wt 110.2 lb

## 2024-06-04 DIAGNOSIS — Z111 Encounter for screening for respiratory tuberculosis: Secondary | ICD-10-CM | POA: Insufficient documentation

## 2024-06-04 DIAGNOSIS — Z79899 Other long term (current) drug therapy: Secondary | ICD-10-CM | POA: Insufficient documentation

## 2024-06-04 DIAGNOSIS — M0600 Rheumatoid arthritis without rheumatoid factor, unspecified site: Secondary | ICD-10-CM | POA: Insufficient documentation

## 2024-06-04 DIAGNOSIS — Z5181 Encounter for therapeutic drug level monitoring: Secondary | ICD-10-CM | POA: Insufficient documentation

## 2024-06-04 DIAGNOSIS — M349 Systemic sclerosis, unspecified: Secondary | ICD-10-CM | POA: Diagnosis present

## 2024-06-04 MED ORDER — ACETAMINOPHEN 325 MG PO TABS
650.0000 mg | ORAL_TABLET | Freq: Once | ORAL | Status: AC
  Administered 2024-06-04: 650 mg via ORAL

## 2024-06-04 MED ORDER — TOCILIZUMAB 400 MG/20ML IV SOLN
4.0000 mg/kg | Freq: Once | INTRAVENOUS | Status: AC
  Administered 2024-06-04: 200 mg via INTRAVENOUS
  Filled 2024-06-04: qty 10

## 2024-06-04 MED ORDER — DIPHENHYDRAMINE HCL 25 MG PO CAPS
25.0000 mg | ORAL_CAPSULE | Freq: Once | ORAL | Status: AC
  Administered 2024-06-04: 25 mg via ORAL

## 2024-06-04 NOTE — Progress Notes (Addendum)
 Diagnosis: Seronegative Rheumatoid Arthritis  Provider:  Jeannetta Bruckner MD  Procedure: IV Infusion  IV Type: Peripheral, IV Location: R Hand   Actemra  (Tocilizumab ), Dose: 200 mg  Infusion Start Time: 1039  Infusion Stop Time: 1140  Post Infusion IV Care: Observation period completed  Discharge: Condition: Good, Destination: Home . AVS Provided  Performed by:  Baldwin Darice Helling, RN

## 2024-06-05 ENCOUNTER — Encounter (HOSPITAL_BASED_OUTPATIENT_CLINIC_OR_DEPARTMENT_OTHER): Admitting: Physical Therapy

## 2024-06-06 ENCOUNTER — Ambulatory Visit (HOSPITAL_BASED_OUTPATIENT_CLINIC_OR_DEPARTMENT_OTHER): Admitting: Physical Therapy

## 2024-06-07 ENCOUNTER — Encounter (HOSPITAL_BASED_OUTPATIENT_CLINIC_OR_DEPARTMENT_OTHER): Admitting: Physical Therapy

## 2024-06-12 ENCOUNTER — Ambulatory Visit (HOSPITAL_BASED_OUTPATIENT_CLINIC_OR_DEPARTMENT_OTHER)

## 2024-06-14 ENCOUNTER — Ambulatory Visit (HOSPITAL_BASED_OUTPATIENT_CLINIC_OR_DEPARTMENT_OTHER): Admitting: Physical Therapy

## 2024-06-17 ENCOUNTER — Ambulatory Visit: Attending: Internal Medicine | Admitting: Internal Medicine

## 2024-06-17 ENCOUNTER — Encounter: Payer: Self-pay | Admitting: Internal Medicine

## 2024-06-17 VITALS — BP 146/84 | HR 72 | Ht 64.5 in | Wt 110.0 lb

## 2024-06-17 DIAGNOSIS — I3139 Other pericardial effusion (noninflammatory): Secondary | ICD-10-CM | POA: Insufficient documentation

## 2024-06-17 NOTE — Progress Notes (Signed)
 "   Cardiology Office Note  Date: 06/17/2024   ID: Tanis, Hensarling August 19, 1970, MRN [id number]  PCP:  Shona Norleen PEDLAR, MD  Cardiologist:  Diannah SHAUNNA Maywood, MD Electrophysiologist:  None   History of Present Illness: Erin Higgins is a 53 y.o. female known to have systemic sclerosis/scleroderma, seronegative rheumatoid arthritis, history of pericardial effusion is here for follow-up visit.  Patient had imaging evidence of moderate to large pericardial effusion and was symptomatic (symptoms improved with sitting up and worse with lying down).  Underwent pericardiocentesis and removed to 50 cc of straw-colored fluid.  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  Limited echocardiogram was obtained after discharge from the hospital.  Limited echo from 04/24/2024 showed moderate pericardial effusion, increased drainage compared to prior.  CVP was 15 mmHg.  Limited echo from 05/22/2024 reviewed and discussed with the patient, increased drainage compared to prior study and CVP was 3 mmHg.  Patient does not have any symptoms of DOE.  No orthopnea, PND, leg swelling either.  No angina.   Past Medical History:  Diagnosis Date   Diverticulitis    Heartburn    Herpes simplex of female genitalia    Scleredema (HCC)    Tapeworm 04/2023    Past Surgical History:  Procedure Laterality Date   COLONOSCOPY     DILATION AND CURETTAGE OF UTERUS     PERICARDIOCENTESIS N/A 03/19/2024   Procedure: PERICARDIOCENTESIS;  Surgeon: Ladona Heinz, MD;  Location: [facility] INVASIVE CV LAB;  Service: Cardiovascular;  Laterality: N/A;   RIGHT HEART CATH N/A 03/19/2024   Procedure: RIGHT HEART CATH;  Surgeon: Ladona Heinz, MD;  Location: [facility] INVASIVE CV LAB;  Service: Cardiovascular;  Laterality: N/A;   TOOTH EXTRACTION  08/2023   TUBAL LIGATION  05/03/2012   Procedure: POST PARTUM TUBAL LIGATION;  Surgeon: Elveria Mungo, MD;  Location: [facility];  Service: Gynecology;  Laterality: Bilateral;  with filshie clips    Current Outpatient Medications  Medication Sig Dispense Refill   amLODipine  (NORVASC ) 2.5 MG tablet TAKE 1 TABLET(2.5 MG) BY MOUTH DAILY 90 tablet 1   busPIRone (BUSPAR) 5 MG tablet Take 5 mg by mouth 2 (two) times daily as needed. for anxiety     diphenhydrAMINE -zinc  acetate (BENADRYL ) cream Apply topically 2 (two) times daily as needed for itching. 28.4 g 0   famotidine  (PEPCID ) 20 MG tablet Take 1 tablet by mouth daily.     methotrexate  (RHEUMATREX) 2.5 MG tablet TAKE EIGHT TABLETS BY MOUTH ONCE A WEEK 96 tablet 0   ondansetron  (ZOFRAN ) 4 MG tablet Take 1 tablet (4 mg total) by mouth every 6 (six) hours. 12 tablet 0   predniSONE  (DELTASONE ) 5 MG tablet Take 4 tablets (20 mg total) by mouth daily with breakfast for 7 days, THEN 3 tablets (15 mg total) daily with breakfast for 14 days, THEN 2 tablets (10 mg total) daily with breakfast for 14 days, THEN 1 tablet (5 mg total) daily with breakfast for 14 days. 96 tablet 0   Tocilizumab  (ACTEMRA  IV) Inject 4 mg/kg into the vein every 28 (twenty-eight) days.     traMADol  (ULTRAM ) 50 MG tablet as needed.     traZODone  (DESYREL ) 50 MG tablet Take 0.5 tablets (25 mg total) by mouth at bedtime as needed for sleep. 30 tablet 1   No current facility-administered medications for this visit.   Allergies:  Penicillins and Folic acid   Social History: The patient  reports that she has never smoked. She has been exposed to tobacco smoke. She has never used smokeless tobacco. She reports that she does not currently use alcohol after a past usage of about 1.0 standard drink of alcohol per week. She reports that she does not use drugs.   Family History: The patient's family history includes Breast cancer in her mother; Diabetes type II in her mother; Hyperlipidemia in her mother; Hypertension in her father and mother; Lupus in her paternal aunt; Sleep  apnea in her mother; Ulcerative colitis in her father; Vitiligo in her paternal great-grandmother.   ROS:  Please see the history of present illness. Otherwise, complete review of systems is positive for none.  All other systems are reviewed and negative.   Physical Exam: VS:  Ht 5' 4.5 (1.638 m)   Wt 110 lb (49.9 kg)   BMI 18.59 kg/m , BMI Body mass index is 18.59 kg/m.  Wt Readings from Last 3 Encounters:  06/17/24 110 lb (49.9 kg)  06/04/24 110 lb 3.7 oz (50 kg)  05/14/24 110 lb 3.2 oz (50 kg)    General: Patient appears comfortable at rest. HEENT: Conjunctiva and lids normal, oropharynx clear with moist mucosa. Neck: Supple, no elevated JVP or carotid bruits, no thyromegaly. Lungs: Clear to auscultation, nonlabored breathing at rest. Cardiac: Regular rate and rhythm, no S3 or significant systolic murmur, no pericardial rub. Abdomen: Soft, nontender, no hepatomegaly, bowel sounds present, no guarding or rebound. Extremities: No pitting edema, distal pulses 2+. Skin: Warm and dry. Musculoskeletal: No kyphosis. Neuropsychiatric: Alert and oriented x3, affect grossly appropriate.  Recent Labwork: 03/19/2024: TSH 5.645 03/21/2024: Magnesium  2.5 05/07/2024: ALT 11; AST 25; BUN 7; Creatinine, Ser 0.42; Hemoglobin 10.7; Platelets 259; Potassium 4.3; Sodium 140     Component Value Date/Time   CHOL 161 05/07/2024 1000   TRIG 84 05/07/2024 1000   HDL 51 05/07/2024 1000   CHOLHDL 3.2 05/07/2024 1000   CHOLHDL 3.4 09/21/2023 1106   LDLCALC 94 05/07/2024 1000   LDLCALC 102 (H) 09/21/2023 1106     Assessment and Plan:  Pericardial effusion - s/p pericardiocentesis, 250 cc of straw-colored fluid was drained in September 2025. - Stable echocardiograms in October and November 2025.  Limited echo from December 2024 reviewed, increased drainage from prior study, CVP 8 mmHg. - Asymptomatic.  No DOE, orthopnea, PND or leg swelling. - Colchicine has drug-drug interactions with  tocilizumab /methotrexate . These medications will decrease the efficacy of colchicine.  Will hold off on starting any colchicine at this time as she does not have any symptoms. - Repeat limited echocardiogram in 2 months. - Cardiac MRI in the future.  Systemic sclerosis/scleroderma Seronegative rheumatoid arthritis - On tocilizumab , methotrexate .  Follows up with rheumatology. - Patient asking if it is okay to get a Port-A-Cath.  Low risk and should be okay provided she has her echo within 2 weeks of the procedure and does not have any evidence of cardiac tamponade.  Will route the note to Dr. Jeannetta.  Raynaud's phenomenon - No tissue loss.  Continue amlodipine  2.5 mg once daily.   30 minutes spent in reviewing prior medical records, reports, more than 3 labs, discussion and documentation.   Medication Adjustments/Labs and Tests Ordered: Current medicines are reviewed at length with the patient today.  Concerns regarding medicines are outlined above.    Disposition:  Follow up 3 months  Signed, Adellyn Capek Arleta Maywood, MD, 06/17/2024 11:30 AM    [facility]  [facility] [address] [place], [place] [address]  "

## 2024-06-17 NOTE — Patient Instructions (Signed)
 Medication Instructions:   Your physician recommends that you continue on your current medications as directed. Please refer to the Current Medication list given to you today.   Labwork: None today  Testing/Procedures: Your physician has requested that you have a LIMITED echocardiogram in 2 months. Echocardiography is a painless test that uses sound waves to create images of your heart. It provides your doctor with information about the size and shape of your heart and how well your hearts chambers and valves are working. This procedure takes approximately one hour. There are no restrictions for this procedure. Please do NOT wear cologne, perfume, aftershave, or lotions (deodorant is allowed). Please arrive 15 minutes prior to your appointment time.  Please note: We ask at that you not bring children with you during ultrasound (echo/ vascular) testing. Due to room size and safety concerns, children are not allowed in the ultrasound rooms during exams. Our front office staff cannot provide observation of children in our lobby area while testing is being conducted. An adult accompanying a patient to their appointment will only be allowed in the ultrasound room at the discretion of the ultrasound technician under special circumstances. We apologize for any inconvenience.   Follow-Up: 3 months Dr.Mallipeddi  Any Other Special Instructions Will Be Listed Below (If Applicable).  If you need a refill on your cardiac medications before your next appointment, please call your pharmacy.

## 2024-06-18 ENCOUNTER — Encounter (HOSPITAL_BASED_OUTPATIENT_CLINIC_OR_DEPARTMENT_OTHER): Admitting: Physical Therapy

## 2024-06-18 ENCOUNTER — Ambulatory Visit (HOSPITAL_COMMUNITY)
Admission: RE | Admit: 2024-06-18 | Discharge: 2024-06-18 | Disposition: A | Discharge status: Home / Self Care | Source: Ambulatory Visit | Attending: Internal Medicine | Admitting: Internal Medicine

## 2024-06-18 ENCOUNTER — Encounter: Payer: Self-pay | Admitting: Internal Medicine

## 2024-06-18 DIAGNOSIS — I3139 Other pericardial effusion (noninflammatory): Secondary | ICD-10-CM | POA: Insufficient documentation

## 2024-06-18 LAB — ECHOCARDIOGRAM LIMITED
Area-P 1/2: 3.99 cm2
S' Lateral: 3.2 cm
Single Plane A4C EF: 57.4 %

## 2024-06-18 NOTE — Progress Notes (Signed)
" °  Echocardiogram 2D Echocardiogram has been performed.  Koleen KANDICE Popper, RDCS 06/18/2024, 11:07 AM "

## 2024-06-21 ENCOUNTER — Ambulatory Visit: Payer: Self-pay | Admitting: Internal Medicine

## 2024-06-21 ENCOUNTER — Encounter: Payer: Self-pay | Admitting: Internal Medicine

## 2024-06-21 NOTE — Telephone Encounter (Signed)
 Patient returning call. Please advise

## 2024-06-25 ENCOUNTER — Encounter (HOSPITAL_BASED_OUTPATIENT_CLINIC_OR_DEPARTMENT_OTHER): Admitting: Physical Therapy

## 2024-06-25 ENCOUNTER — Encounter: Payer: Self-pay | Admitting: Internal Medicine

## 2024-06-25 DIAGNOSIS — Z79899 Other long term (current) drug therapy: Secondary | ICD-10-CM

## 2024-06-25 DIAGNOSIS — M349 Systemic sclerosis, unspecified: Secondary | ICD-10-CM

## 2024-06-26 ENCOUNTER — Other Ambulatory Visit: Payer: Self-pay

## 2024-06-27 ENCOUNTER — Telehealth: Payer: Self-pay

## 2024-06-27 NOTE — Telephone Encounter (Signed)
 Auth Submission: NO AUTH NEEDED Site of care: Site of care: [facility] Payer: [place] [employer]ICA[employer] [place] [employer]  Medication & CPT/J Code(s) submitted: Actemra  J3262 Diagnosis Code:  Route of submission (phone, fax, portal): phone Phone # Fax # Auth type: Buy/Bill HB Units/visits requested: 200mg  q4 weeks Reference number: [id number] Approval from: 06/27/24 to 06/26/25

## 2024-06-28 ENCOUNTER — Encounter (HOSPITAL_BASED_OUTPATIENT_CLINIC_OR_DEPARTMENT_OTHER): Admitting: Physical Therapy

## 2024-07-01 ENCOUNTER — Encounter (HOSPITAL_COMMUNITY): Payer: Self-pay | Admitting: Emergency Medicine

## 2024-07-01 ENCOUNTER — Other Ambulatory Visit: Payer: Self-pay

## 2024-07-01 ENCOUNTER — Emergency Department (HOSPITAL_COMMUNITY)

## 2024-07-01 ENCOUNTER — Inpatient Hospital Stay (HOSPITAL_COMMUNITY)
Admission: EM | Admit: 2024-07-01 | Discharge: 2024-07-05 | DRG: 316 | Disposition: A | Discharge status: Home / Self Care | Attending: Internal Medicine | Admitting: Internal Medicine

## 2024-07-01 DIAGNOSIS — D649 Anemia, unspecified: Secondary | ICD-10-CM | POA: Diagnosis present

## 2024-07-01 DIAGNOSIS — I119 Hypertensive heart disease without heart failure: Secondary | ICD-10-CM | POA: Diagnosis present

## 2024-07-01 DIAGNOSIS — M349 Systemic sclerosis, unspecified: Secondary | ICD-10-CM | POA: Diagnosis present

## 2024-07-01 DIAGNOSIS — I7300 Raynaud's syndrome without gangrene: Secondary | ICD-10-CM | POA: Diagnosis present

## 2024-07-01 DIAGNOSIS — Z833 Family history of diabetes mellitus: Secondary | ICD-10-CM

## 2024-07-01 DIAGNOSIS — Z79899 Other long term (current) drug therapy: Secondary | ICD-10-CM

## 2024-07-01 DIAGNOSIS — R0602 Shortness of breath: Principal | ICD-10-CM

## 2024-07-01 DIAGNOSIS — Z79631 Long term (current) use of antimetabolite agent: Secondary | ICD-10-CM

## 2024-07-01 DIAGNOSIS — Z880 Allergy status to penicillin: Secondary | ICD-10-CM

## 2024-07-01 DIAGNOSIS — R002 Palpitations: Secondary | ICD-10-CM | POA: Diagnosis present

## 2024-07-01 DIAGNOSIS — Z8619 Personal history of other infectious and parasitic diseases: Secondary | ICD-10-CM

## 2024-07-01 DIAGNOSIS — F32A Depression, unspecified: Secondary | ICD-10-CM | POA: Diagnosis present

## 2024-07-01 DIAGNOSIS — F419 Anxiety disorder, unspecified: Secondary | ICD-10-CM | POA: Diagnosis present

## 2024-07-01 DIAGNOSIS — Z59868 Other specified financial insecurity: Secondary | ICD-10-CM

## 2024-07-01 DIAGNOSIS — Z8249 Family history of ischemic heart disease and other diseases of the circulatory system: Secondary | ICD-10-CM

## 2024-07-01 DIAGNOSIS — K219 Gastro-esophageal reflux disease without esophagitis: Secondary | ICD-10-CM | POA: Diagnosis present

## 2024-07-01 DIAGNOSIS — M0600 Rheumatoid arthritis without rheumatoid factor, unspecified site: Secondary | ICD-10-CM | POA: Diagnosis present

## 2024-07-01 DIAGNOSIS — I3139 Other pericardial effusion (noninflammatory): Principal | ICD-10-CM | POA: Diagnosis present

## 2024-07-01 DIAGNOSIS — Z1152 Encounter for screening for COVID-19: Secondary | ICD-10-CM

## 2024-07-01 DIAGNOSIS — Z59819 Housing instability, housed unspecified: Secondary | ICD-10-CM

## 2024-07-01 DIAGNOSIS — Z888 Allergy status to other drugs, medicaments and biological substances status: Secondary | ICD-10-CM

## 2024-07-01 LAB — CBC
HCT: 37.3 % (ref 36.0–46.0)
Hemoglobin: 12 g/dL (ref 12.0–15.0)
MCH: 29.6 pg (ref 26.0–34.0)
MCHC: 32.2 g/dL (ref 30.0–36.0)
MCV: 92.1 fL (ref 80.0–100.0)
Platelets: 268 K/uL (ref 150–400)
RBC: 4.05 MIL/uL (ref 3.87–5.11)
RDW: 15.6 % — ABNORMAL HIGH (ref 11.5–15.5)
WBC: 5.2 K/uL (ref 4.0–10.5)
nRBC: 0 % (ref 0.0–0.2)

## 2024-07-01 LAB — BASIC METABOLIC PANEL WITH GFR
Anion gap: 7 (ref 5–15)
BUN: 11 mg/dL (ref 6–20)
CO2: 30 mmol/L (ref 22–32)
Calcium: 9.4 mg/dL (ref 8.9–10.3)
Chloride: 104 mmol/L (ref 98–111)
Creatinine, Ser: 0.43 mg/dL — ABNORMAL LOW (ref 0.44–1.00)
GFR, Estimated: >60 mL/min
Glucose, Bld: 91 mg/dL (ref 70–99)
Potassium: 3.9 mmol/L (ref 3.5–5.1)
Sodium: 141 mmol/L (ref 135–145)

## 2024-07-01 LAB — RESP PANEL BY RT-PCR (RSV, FLU A&B, COVID)  RVPGX2
Influenza A by PCR: NEGATIVE
Influenza B by PCR: NEGATIVE
Resp Syncytial Virus by PCR: NEGATIVE
SARS Coronavirus 2 by RT PCR: NEGATIVE

## 2024-07-01 NOTE — ED Provider Notes (Signed)
 " [facility] AT [facility] Provider Note   CSN: [id number] Arrival date & time: 07/01/24  2220     Patient presents with: Shortness of Breath   Erin Higgins is a 54 y.o. female.    Shortness of Breath Patient with history of scleroderma.  Has had shortness of breath.  Known history of pericardial effusion.  Has been more short of breath around the last week and a half.  No fevers.  Does have some paresthesias in her arms at times.  Does have some general weakness.  No cough.  Had echocardiogram done on December 23 that showed increased fluid.    Past Medical History:  Diagnosis Date   Diverticulitis    Heartburn    Herpes simplex of female genitalia    Scleredema (HCC)    Tapeworm 04/2023    Prior to Admission medications  Medication Sig Start Date End Date Taking? Authorizing Provider  amLODipine  (NORVASC ) 2.5 MG tablet TAKE 1 TABLET(2.5 MG) BY MOUTH DAILY 10/14/22   Mallipeddi, Vishnu P, MD  busPIRone  (BUSPAR ) 5 MG tablet Take 5 mg by mouth 2 (two) times daily as needed. for anxiety 03/27/24   [provider]  diphenhydrAMINE -zinc  acetate (BENADRYL ) cream Apply topically 2 (two) times daily as needed for itching. 03/21/24   Odell Celinda Balo, MD  famotidine  (PEPCID ) 20 MG tablet Take 1 tablet by mouth daily. 07/25/23   [provider]  methotrexate  (RHEUMATREX) 2.5 MG tablet TAKE EIGHT TABLETS BY MOUTH ONCE A WEEK 04/23/24   Rice, Lonni ORN, MD  ondansetron  (ZOFRAN ) 4 MG tablet Take 1 tablet (4 mg total) by mouth every 6 (six) hours. 04/12/24   Idol, Julie, PA-C  predniSONE  (DELTASONE ) 5 MG tablet Take 4 tablets (20 mg total) by mouth daily with breakfast for 7 days, THEN 3 tablets (15 mg total) daily with breakfast for 14 days, THEN 2 tablets (10 mg total) daily with breakfast for 14 days, THEN 1 tablet (5 mg total) daily with breakfast for 14 days. 05/14/24 07/02/24  Mallipeddi, Vishnu P, MD  Tocilizumab  (ACTEMRA  IV) Inject  4 mg/kg into the vein every 28 (twenty-eight) days.    [provider]  traMADol  (ULTRAM ) 50 MG tablet as needed. 03/27/24   [provider]  traZODone  (DESYREL ) 50 MG tablet Take 0.5 tablets (25 mg total) by mouth at bedtime as needed for sleep. 03/21/24   Odell Celinda Balo, MD    Allergies: Penicillins and Folic acid     Review of Systems  Respiratory:  Positive for shortness of breath.     Updated Vital Signs BP (!) 142/96 (BP Location: Right Arm)   Pulse 89   Resp 19   Ht 5' 4.5 (1.638 m)   Wt 49.9 kg   SpO2 100%   BMI 18.59 kg/m   Physical Exam Vitals and nursing note reviewed.  Cardiovascular:     Rate and Rhythm: Regular rhythm.  Pulmonary:     Breath sounds: No wheezing or rhonchi.  Musculoskeletal:     Right lower leg: No edema.     Left lower leg: No edema.  Neurological:     Mental Status: She is alert.     (all labs ordered are listed, but only abnormal results are displayed) Labs Reviewed  RESP PANEL BY RT-PCR (RSV, FLU A&B, COVID)  [id number]  BASIC METABOLIC PANEL WITH GFR  CBC    EKG: None  Radiology: No results found.   Procedures   Medications Ordered in  the ED - No data to display                                  Medical Decision Making Amount and/or Complexity of Data Reviewed Labs: ordered. Radiology: ordered.   Patient with history of scleroderma.  Previous pericardial effusion.  Now more shortness of breath.  No swelling in her legs.  Echocardiogram from around 2 weeks ago did show increasing pericardial effusion.  Will get x-ray and basic blood work.  Care will be turned over to Dr. Raford.     Final diagnoses:  None    ED Discharge Orders     None          Patsey Lot, MD 07/01/24 2311  "

## 2024-07-01 NOTE — ED Triage Notes (Signed)
 Pt arrives to ED c/o some episodes of shortness of breath and c/o her right foot feeling cold. Pt states she recently had an ECHO last month and was told that she had fluid around her heart.

## 2024-07-01 NOTE — ED Provider Notes (Signed)
 Care assumed from Dr. Patsey, patient with scleroderma and pericardial effusion presenting with increasing shortness of breath.  Will likely need admission for repeat echocardiogram.  Labs are pending.  I have reviewed her laboratory tests, my interpretation is normal CBC and normal basic metabolic panel.  Negative PCR for COVID-19 and influenza and RSV.  I have discussed case with Dr. Shona of [facility], who agrees to admit the patient.   Raford Lenis, MD 07/02/24 [phone]

## 2024-07-02 ENCOUNTER — Encounter: Payer: Self-pay | Admitting: *Deleted

## 2024-07-02 ENCOUNTER — Inpatient Hospital Stay (HOSPITAL_COMMUNITY)

## 2024-07-02 ENCOUNTER — Encounter (HOSPITAL_BASED_OUTPATIENT_CLINIC_OR_DEPARTMENT_OTHER): Admitting: Physical Therapy

## 2024-07-02 ENCOUNTER — Ambulatory Visit

## 2024-07-02 ENCOUNTER — Other Ambulatory Visit (HOSPITAL_COMMUNITY): Payer: Self-pay | Admitting: *Deleted

## 2024-07-02 DIAGNOSIS — Z79631 Long term (current) use of antimetabolite agent: Secondary | ICD-10-CM | POA: Diagnosis not present

## 2024-07-02 DIAGNOSIS — I3139 Other pericardial effusion (noninflammatory): Secondary | ICD-10-CM | POA: Diagnosis present

## 2024-07-02 DIAGNOSIS — R531 Weakness: Secondary | ICD-10-CM | POA: Diagnosis not present

## 2024-07-02 DIAGNOSIS — F419 Anxiety disorder, unspecified: Secondary | ICD-10-CM | POA: Diagnosis present

## 2024-07-02 DIAGNOSIS — Z888 Allergy status to other drugs, medicaments and biological substances status: Secondary | ICD-10-CM | POA: Diagnosis not present

## 2024-07-02 DIAGNOSIS — Z833 Family history of diabetes mellitus: Secondary | ICD-10-CM | POA: Diagnosis not present

## 2024-07-02 DIAGNOSIS — Z1152 Encounter for screening for COVID-19: Secondary | ICD-10-CM | POA: Diagnosis not present

## 2024-07-02 DIAGNOSIS — Z9889 Other specified postprocedural states: Secondary | ICD-10-CM | POA: Diagnosis not present

## 2024-07-02 DIAGNOSIS — Z743 Need for continuous supervision: Secondary | ICD-10-CM | POA: Diagnosis not present

## 2024-07-02 DIAGNOSIS — M349 Systemic sclerosis, unspecified: Secondary | ICD-10-CM | POA: Diagnosis present

## 2024-07-02 DIAGNOSIS — Z59868 Other specified financial insecurity: Secondary | ICD-10-CM | POA: Diagnosis not present

## 2024-07-02 DIAGNOSIS — Z8249 Family history of ischemic heart disease and other diseases of the circulatory system: Secondary | ICD-10-CM | POA: Diagnosis not present

## 2024-07-02 DIAGNOSIS — D649 Anemia, unspecified: Secondary | ICD-10-CM | POA: Diagnosis present

## 2024-07-02 DIAGNOSIS — Z79899 Other long term (current) drug therapy: Secondary | ICD-10-CM | POA: Diagnosis not present

## 2024-07-02 DIAGNOSIS — R002 Palpitations: Secondary | ICD-10-CM | POA: Diagnosis present

## 2024-07-02 DIAGNOSIS — M0600 Rheumatoid arthritis without rheumatoid factor, unspecified site: Secondary | ICD-10-CM | POA: Diagnosis present

## 2024-07-02 DIAGNOSIS — Z880 Allergy status to penicillin: Secondary | ICD-10-CM | POA: Diagnosis not present

## 2024-07-02 DIAGNOSIS — K219 Gastro-esophageal reflux disease without esophagitis: Secondary | ICD-10-CM | POA: Diagnosis present

## 2024-07-02 DIAGNOSIS — F32A Depression, unspecified: Secondary | ICD-10-CM | POA: Diagnosis present

## 2024-07-02 DIAGNOSIS — I7300 Raynaud's syndrome without gangrene: Secondary | ICD-10-CM | POA: Diagnosis present

## 2024-07-02 DIAGNOSIS — Z8619 Personal history of other infectious and parasitic diseases: Secondary | ICD-10-CM | POA: Diagnosis not present

## 2024-07-02 DIAGNOSIS — R0602 Shortness of breath: Secondary | ICD-10-CM | POA: Diagnosis present

## 2024-07-02 DIAGNOSIS — I119 Hypertensive heart disease without heart failure: Secondary | ICD-10-CM | POA: Diagnosis present

## 2024-07-02 DIAGNOSIS — I10 Essential (primary) hypertension: Secondary | ICD-10-CM | POA: Diagnosis not present

## 2024-07-02 LAB — CBC
HCT: 35.4 % — ABNORMAL LOW (ref 36.0–46.0)
Hemoglobin: 11.2 g/dL — ABNORMAL LOW (ref 12.0–15.0)
MCH: 28.7 pg (ref 26.0–34.0)
MCHC: 31.6 g/dL (ref 30.0–36.0)
MCV: 90.8 fL (ref 80.0–100.0)
Platelets: 259 K/uL (ref 150–400)
RBC: 3.9 MIL/uL (ref 3.87–5.11)
RDW: 15.4 % (ref 11.5–15.5)
WBC: 5 K/uL (ref 4.0–10.5)
nRBC: 0 % (ref 0.0–0.2)

## 2024-07-02 LAB — ECHOCARDIOGRAM COMPLETE
Area-P 1/2: 3.19 cm2
Height: 64.5 in
S' Lateral: 2.9 cm
Weight: 1760.01 [oz_av]

## 2024-07-02 LAB — BASIC METABOLIC PANEL WITH GFR
Anion gap: 9 (ref 5–15)
BUN: 11 mg/dL (ref 6–20)
CO2: 27 mmol/L (ref 22–32)
Calcium: 9.1 mg/dL (ref 8.9–10.3)
Chloride: 104 mmol/L (ref 98–111)
Creatinine, Ser: 0.34 mg/dL — ABNORMAL LOW (ref 0.44–1.00)
GFR, Estimated: >60 mL/min
Glucose, Bld: 90 mg/dL (ref 70–99)
Potassium: 3.6 mmol/L (ref 3.5–5.1)
Sodium: 140 mmol/L (ref 135–145)

## 2024-07-02 LAB — MAGNESIUM: Magnesium: 2 mg/dL (ref 1.7–2.4)

## 2024-07-02 LAB — PHOSPHORUS: Phosphorus: 3.7 mg/dL (ref 2.5–4.6)

## 2024-07-02 LAB — TSH: TSH: 1.85 u[IU]/mL (ref 0.350–4.500)

## 2024-07-02 MED ORDER — BUSPIRONE HCL 5 MG PO TABS
5.0000 mg | ORAL_TABLET | Freq: Two times a day (BID) | ORAL | Status: DC | PRN
  Administered 2024-07-04 – 2024-07-05 (×2): 5 mg via ORAL
  Filled 2024-07-02 (×2): qty 1

## 2024-07-02 MED ORDER — HEPARIN SODIUM (PORCINE) 5000 UNIT/ML IJ SOLN
5000.0000 [IU] | Freq: Three times a day (TID) | INTRAMUSCULAR | Status: DC
  Administered 2024-07-02 – 2024-07-05 (×11): 5000 [IU] via SUBCUTANEOUS
  Filled 2024-07-02 (×11): qty 1

## 2024-07-02 MED ORDER — ACETAMINOPHEN 500 MG PO TABS
500.0000 mg | ORAL_TABLET | Freq: Four times a day (QID) | ORAL | Status: DC | PRN
  Administered 2024-07-05: 500 mg via ORAL
  Filled 2024-07-02: qty 1

## 2024-07-02 MED ORDER — TRAZODONE HCL 50 MG PO TABS: 25.0000 mg | ORAL_TABLET | Freq: Every evening | ORAL | Status: DC | PRN

## 2024-07-02 MED ORDER — MELATONIN 5 MG PO TABS: 5.0000 mg | ORAL_TABLET | Freq: Every evening | ORAL | Status: DC | PRN

## 2024-07-02 MED ORDER — PROCHLORPERAZINE EDISYLATE 10 MG/2ML IJ SOLN: 5.0000 mg | Freq: Four times a day (QID) | INTRAMUSCULAR | Status: DC | PRN

## 2024-07-02 MED ORDER — FAMOTIDINE 20 MG PO TABS
20.0000 mg | ORAL_TABLET | Freq: Every day | ORAL | Status: DC
  Administered 2024-07-02 – 2024-07-05 (×4): 20 mg via ORAL
  Filled 2024-07-02 (×4): qty 1

## 2024-07-02 MED ORDER — POLYETHYLENE GLYCOL 3350 17 G PO PACK: 17.0000 g | PACK | Freq: Every day | ORAL | Status: DC | PRN

## 2024-07-02 NOTE — Progress Notes (Signed)
 I called patient, referral changed.

## 2024-07-02 NOTE — Progress Notes (Signed)
 Patient called the office to see if her referral to [facility] could be sent to the [facility] in [place] instead of [place]. Please advise

## 2024-07-02 NOTE — Progress Notes (Signed)
*  PRELIMINARY RESULTS* Echocardiogram 2D Echocardiogram has been performed.  Teresa Aida PARAS 07/02/2024, 11:38 AM

## 2024-07-02 NOTE — H&P (Addendum)
 " History and Physical  Erin Higgins FMW:[id number] DOB: 01-14-1971 DOA: 07/01/2024  Referring physician: Dr. Raford, EDP. PCP: Shona Norleen PEDLAR, MD  Outpatient Specialists: Rheumatology, cardiology. Patient coming from: Home.  Chief Complaint: Shortness of breath  HPI: Erin Higgins is a 54 y.o. female with medical history significant for scleroderma, known pericardial effusion, hypertension, GERD, who presents to the ER with complaints of worsening shortness of breath and intermittent palpitations, worse with exertion.  Symptoms started about a week and a half ago.  No reported subjective fevers.  Endorses feeling cold most of the time and generalized weakness.  In the ER, tachypneic with respiratory rate of 26.  Not hypoxic with O2 saturation of 100% on room air.  Lab studies were unrevealing.  A chest x-ray showed cardiomegaly with concern for possible moderate pericardial effusion.  [facility], hospitalist service, was asked to admit for possible pericardiocentesis versus pericardial window by cardiothoracic surgery.  Admitted to progressive care unit at [facility].  ED Course: Temperature 98.4.  BP 127/87, pulse 88, respiration rate 19, O2 saturation 100% on room air.  Review of Systems: Review of systems as noted in the HPI. All other systems reviewed and are negative.   Past Medical History:  Diagnosis Date   Diverticulitis    Heartburn    Herpes simplex of female genitalia    Scleredema (HCC)    Tapeworm 04/2023   Past Surgical History:  Procedure Laterality Date   COLONOSCOPY     DILATION AND CURETTAGE OF UTERUS     PERICARDIOCENTESIS N/A 03/19/2024   Procedure: PERICARDIOCENTESIS;  Surgeon: Ladona Heinz, MD;  Location: [facility] INVASIVE CV LAB;  Service: Cardiovascular;  Laterality: N/A;   RIGHT HEART CATH N/A 03/19/2024   Procedure: RIGHT HEART CATH;  Surgeon: Ladona Heinz, MD;  Location: [facility] INVASIVE CV LAB;  Service: Cardiovascular;  Laterality: N/A;   TOOTH EXTRACTION   08/2023   TUBAL LIGATION  05/03/2012   Procedure: POST PARTUM TUBAL LIGATION;  Surgeon: Elveria Mungo, MD;  Location: [facility] ORS;  Service: Gynecology;  Laterality: Bilateral;  with filshie clips    Social History:  reports that she has never smoked. She has been exposed to tobacco smoke. She has never used smokeless tobacco. She reports that she does not currently use alcohol after a past usage of about 1.0 standard drink of alcohol per week. She reports that she does not use drugs.   Allergies[1]  Family History  Problem Relation Age of Onset   Hyperlipidemia Mother    Hypertension Mother    Diabetes type II Mother    Sleep apnea Mother    Breast cancer Mother    Hypertension Father    Ulcerative colitis Father    Vitiligo Paternal Great-grandmother    Lupus Paternal Aunt       Prior to Admission medications  Medication Sig Start Date End Date Taking? Authorizing Provider  amLODipine  (NORVASC ) 2.5 MG tablet TAKE 1 TABLET(2.5 MG) BY MOUTH DAILY 10/14/22   Mallipeddi, Vishnu P, MD  busPIRone  (BUSPAR ) 5 MG tablet Take 5 mg by mouth 2 (two) times daily as needed. for anxiety 03/27/24   [provider]  diphenhydrAMINE -zinc  acetate (BENADRYL ) cream Apply topically 2 (two) times daily as needed for itching. 03/21/24   Odell Celinda Balo, MD  famotidine  (PEPCID ) 20 MG tablet Take 1 tablet by mouth daily. 07/25/23   [provider]  methotrexate  (RHEUMATREX) 2.5 MG tablet TAKE EIGHT TABLETS BY MOUTH ONCE A WEEK 04/23/24   Rice,  Lonni ORN, MD  ondansetron  (ZOFRAN ) 4 MG tablet Take 1 tablet (4 mg total) by mouth every 6 (six) hours. 04/12/24   Idol, Julie, PA-C  predniSONE  (DELTASONE ) 5 MG tablet Take 4 tablets (20 mg total) by mouth daily with breakfast for 7 days, THEN 3 tablets (15 mg total) daily with breakfast for 14 days, THEN 2 tablets (10 mg total) daily with breakfast for 14 days, THEN 1 tablet (5 mg total) daily with breakfast for 14 days. 05/14/24 07/02/24   Mallipeddi, Vishnu P, MD  Tocilizumab  (ACTEMRA  IV) Inject 4 mg/kg into the vein every 28 (twenty-eight) days.    [provider]  traMADol  (ULTRAM ) 50 MG tablet as needed. 03/27/24   [provider]  traZODone  (DESYREL ) 50 MG tablet Take 0.5 tablets (25 mg total) by mouth at bedtime as needed for sleep. 03/21/24   Odell Celinda Balo, MD    Physical Exam: BP 129/78   Pulse 83   Temp 98.4 F (36.9 C)   Resp (!) 26   Ht 5' 4.5 (1.638 m)   Wt 49.9 kg   SpO2 98%   BMI 18.59 kg/m   General: 54 y.o. year-old female well developed well nourished in no acute distress.  Alert and oriented x3. Cardiovascular: Regular rate and rhythm with no rubs or gallops.  No thyromegaly or JVD noted.  No lower extremity edema. 2/4 pulses in all 4 extremities. Respiratory: Clear to auscultation with no wheezes or rales. Good inspiratory effort. Abdomen: Soft nontender nondistended with normal bowel sounds x4 quadrants. Muskuloskeletal: No cyanosis, clubbing or edema noted bilaterally Neuro: CN II-XII intact, strength, sensation, reflexes Skin: No ulcerative lesions noted or rashes Psychiatry: Judgement and insight appear normal. Mood is appropriate for condition and setting          Labs on Admission:  Basic Metabolic Panel: Recent Labs  Lab 07/01/24 2251  NA 141  K 3.9  CL 104  CO2 30  GLUCOSE 91  BUN 11  CREATININE 0.43*  CALCIUM 9.4   Liver Function Tests: No results for input(s): AST, ALT, ALKPHOS, BILITOT, PROT, ALBUMIN in the last 168 hours. No results for input(s): LIPASE, AMYLASE in the last 168 hours. No results for input(s): AMMONIA in the last 168 hours. CBC: Recent Labs  Lab 07/01/24 2251  WBC 5.2  HGB 12.0  HCT 37.3  MCV 92.1  PLT 268   Cardiac Enzymes: No results for input(s): CKTOTAL, CKMB, CKMBINDEX, TROPONINI in the last 168 hours.  BNP (last 3 results) No results for input(s): BNP in the last 8760 hours.  ProBNP  (last 3 results) No results for input(s): PROBNP in the last 8760 hours.  CBG: No results for input(s): GLUCAP in the last 168 hours.  Radiological Exams on Admission: DG Chest 2 View Result Date: 07/01/2024 EXAM: 2 VIEW(S) XRAY OF THE CHEST 07/01/2024 11:01:00 PM COMPARISON: 04/12/2024 CLINICAL HISTORY: shortness of breath FINDINGS: LUNGS AND PLEURA: No focal pulmonary opacity. No pleural effusion. No pneumothorax. HEART AND MEDIASTINUM: Cardiomegaly. BONES AND SOFT TISSUES: Small irregular density within left humeral shaft, possibly bone infarct or enchondroma. No acute osseous abnormality. IMPRESSION: 1. Cardiomegaly. Electronically signed by: Elsie Gravely MD 07/01/2024 11:06 PM EST RP Workstation: [id number]    EKG: I independently viewed the EKG done and my findings are as followed: Sinus rhythm rate of 86.  QTc 480.  Assessment/Plan Present on Admission:  Pericardial effusion  Principal Problem:   Pericardial effusion  Pericardial effusion, suspected moderate, POA Known history of pericardial  effusion Follow transthoracic echocardiogram. May benefit from cardiothoracic surgery evaluation, pericardiocentesis versus pericardial window by CTS Consult cardiothoracic surgery once the patient arrives at [facility]. Closely monitor vital signs.  Palpitations, intermittent Follow TSH Monitor on telemetry  Scleroderma Follows with rheumatology outpatient Resume home regimen.  Hypertension GERD Resume home famotidine . Blood pressure is currently at goal 118/89   Time: 75 minutes.   DVT prophylaxis: Subcu heparin  3 times daily.  Code Status: Full code  Family Communication: The patient's mother is at bedside  Disposition Plan: Admitted to progressive care unit.  Consults called: None.  Admission status: Inpatient   Status is: Inpatient The patient requires at least 2 midnights for further evaluation and treatment of present condition.   Terry LOISE Hurst MD [facility] Pager [phone]  If 7PM-7AM, please contact night-coverage [email or web address] Password [email or web address]  07/02/2024, 1:01 AM      [1]  Allergies Allergen Reactions   Penicillins Swelling    Has patient had a PCN reaction causing immediate rash, facial/tongue/throat swelling, SOB or lightheadedness with hypotension: No  Has patient had a PCN reaction causing severe rash involving mucus membranes or skin necrosis: No  Has patient had a PCN reaction that required hospitalization: No  Has patient had a PCN reaction occurring within the last 10 years: No  If all of the above answers are NO, then may proceed with Cephalosporin use.   Folic Acid  Diarrhea and Nausea And Vomiting   "

## 2024-07-02 NOTE — Progress Notes (Signed)
"  °  Progress Note   Patient: Erin Higgins FMW:[id number] DOB: [birth year] DOA: 07/01/2024     0 DOS: the patient was seen and examined on 07/02/2024   Brief hospital admission narrative: As per H&P written by Dr. Shona on 07/02/24 Erin Higgins Baik is a 54 y.o. female with medical history significant for scleroderma, known pericardial effusion, hypertension, GERD, who presents to the ER with complaints of worsening shortness of breath and intermittent palpitations, worse with exertion.  Symptoms started about a week and a half ago.  No reported subjective fevers.  Endorses feeling cold most of the time and generalized weakness.   In the ER, tachypneic with respiratory rate of 26.  Not hypoxic with O2 saturation of 100% on room air.  Lab studies were unrevealing.  A chest x-ray showed cardiomegaly with concern for possible moderate pericardial effusion.   TRH, hospitalist service, was asked to admit for possible pericardiocentesis versus pericardial window by cardiothoracic surgery.  Admitted to progressive care unit at [facility].   ED Course: Temperature 98.4.  BP 127/87, pulse 88, respiration rate 19, O2 saturation 100% on room air.  Assessment and plan 1-moderate pericardial effusion - Present on admission - Follow echo results - Cardiology service to be consulted for pericardiocentesis versus need of CTS involvement for pericardial window - Due to services need to be consulted on arrival to [facility]. - Patient is hemodynamically stable and there is no suggestion of tamponade. - Patient just completed treatment of tapering prednisone  as an outpatient.  2-intermittent palpitations - TSH within normal limits - Continue telemetry monitoring - Follow electrolytes and replete as needed.  3-history of scleroderma - Continue home medication regimen - Continue patient follow-up with rheumatology service.  4-hypertension - Blood pressure soft but stable continue monitoring  vital sign - Will hold Norvasc  in the morning.  5-GERD - Continue famotidine .  6-anxiety - Continue as needed BuSpar .  Subjective:  In no acute distress at rest; patient reports unsure winded sensation and shortness of breath with activity.  No fever, no nausea, no vomiting.  Physical Exam: Vitals:   07/02/24 0225 07/02/24 0300 07/02/24 0347 07/02/24 0600  BP:  118/89  127/86  Pulse: 84 85  70  Resp: 19 (!) 27  20  Temp:   98.5 F (36.9 C)   TempSrc:   Oral   SpO2: 99% 99%  98%  Weight:      Height:       General exam: Alert, awake, following commands appropriately and in no acute distress. Respiratory system: No using accessory muscle.  Good saturation on room air. Cardiovascular system: Rate controlled, no rubs, no gallops, no JVD. Gastrointestinal system: Abdomen is nondistended, soft and nontender.  Positive bowel sounds appreciated on exam. Central nervous system: No focal neurological deficits. Extremities: No cyanosis or clubbing. Skin: No petechiae. Psychiatry: Judgement and insight appear normal. Mood & affect stable.   Data Reviewed: CBC: WBCs 4.0, hemoglobin 11.1 platelet count 259 K Basic metabolic panel: Sodium 140, potassium 3.6, chloride 104, bicarb 27, BUN 11, creatinine 0.34 and GFR >60 Magnesium : 2.0  Family Communication: No family at bedside.  Disposition: Status is: Inpatient Remains inpatient appropriate because: Continue IV therapy.   Time spent: 35 minutes  Author: Eric Nunnery, MD 07/02/2024 8:27 AM  For on call review [email or web address].    "

## 2024-07-03 ENCOUNTER — Other Ambulatory Visit: Payer: Self-pay | Admitting: Cardiology

## 2024-07-03 ENCOUNTER — Telehealth: Payer: Self-pay

## 2024-07-03 DIAGNOSIS — I3139 Other pericardial effusion (noninflammatory): Secondary | ICD-10-CM | POA: Diagnosis not present

## 2024-07-03 DIAGNOSIS — I10 Essential (primary) hypertension: Secondary | ICD-10-CM | POA: Diagnosis not present

## 2024-07-03 DIAGNOSIS — M0600 Rheumatoid arthritis without rheumatoid factor, unspecified site: Secondary | ICD-10-CM | POA: Diagnosis not present

## 2024-07-03 DIAGNOSIS — M349 Systemic sclerosis, unspecified: Secondary | ICD-10-CM | POA: Diagnosis not present

## 2024-07-03 DIAGNOSIS — Z9889 Other specified postprocedural states: Secondary | ICD-10-CM | POA: Diagnosis not present

## 2024-07-03 MED ORDER — ORAL CARE MOUTH RINSE: 15.0000 mL | OROMUCOSAL | Status: DC | PRN

## 2024-07-03 NOTE — Consult Note (Addendum)
 "  Cardiology Consultation  Patient ID: Erin Higgins MRN: [id number]; DOB: 06/07/1971  Admit date: 07/01/2024 Date of Consult: 07/03/2024  PCP:  Shona Norleen PEDLAR, MD   [facility] Providers Cardiologist:  Diannah SHAUNNA Maywood, MD     Patient Profile: ALAISHA Higgins is a 54 y.o. female with a hx of systemic sclerosis/scleroderma, seronegative rheumatoid arthritis, history of pericardial effusion who is being seen 07/03/2024 for the evaluation of pericardial effusion at the request of Dr. Juvenal.  History of Present Illness: [name] has past medical history as stated above.  She presented to the [facility] emergency department on 07/01/2024 complaining of shortness of breath.  He reported that she has been significantly more short of breath in the last week and a half, noting some numbness and tingling in her arms at times.  She was sent over to [facility] to undergo evaluation for possible pericardiocentesis/pericardial window.  Echocardiogram this admission showed: LVEF 60 to 65%, moderate pericardial effusion, noted to be stable from December 2025 study.  No evidence of tamponade.  Relevant workup in the ER includes: Metabolic panel showed creatinine 0.3-0.4, respiratory panel negative, CBC unremarkable showing chronic anemia.  TSH normal.  CXR showed cardiomegaly.  Echocardiogram as above.  EKG showed sinus rhythm, HR 86, prior LAFB, no significant changes when compared to prior tracings.  She was admitted to the hospitalist service, patient was sent over for CT surgery evaluation for possible pericardial window/pericardiocentesis.  Cardiology was consulted with this as above.   Patient has recent history of pericardial effusion requiring pericardiocentesis with 250 cc of straw-colored fluid drained in September 2025.  She underwent echocardiograms in October and November 2025 that were stable.  She was last seen in clinic 06/17/2024 and was asymptomatic, no dyspnea on  exertion, orthopnea, PND, leg swelling.  She was not started on any colchicine due to the interaction between colchicine and tocilizumab /methotrexate .  The plan was to repeat an echocardiogram in 2 months (February 2026), plan with cardiac MRI in the future.  After speaking with the patient, she reports that she has been becoming more symptomatic lately. She tells me that she has tried various things, including prednisone  but that has not helped. She was unable to take colchicine due to the interactions for her infusions for scleroderma. Overall, she is wanting to proceed with pericardial window if that is indicated.   She also tells me that when she goes to get her infusions done she was told that her veins are getting difficult to stick and they discussed possibly need a port placement. I discussed that I would chat with the primary team to ensure that if she is able to get that done while she is here but told her that would be done by a separate team then with our CT surgery team who may need to place a window.   Past Medical History:  Diagnosis Date   Diverticulitis    Heartburn    Herpes simplex of female genitalia    Scleredema (HCC)    Tapeworm 04/2023   Past Surgical History:  Procedure Laterality Date   COLONOSCOPY     DILATION AND CURETTAGE OF UTERUS     PERICARDIOCENTESIS N/A 03/19/2024   Procedure: PERICARDIOCENTESIS;  Surgeon: Ladona Heinz, MD;  Location: [facility] INVASIVE CV LAB;  Service: Cardiovascular;  Laterality: N/A;   RIGHT HEART CATH N/A 03/19/2024   Procedure: RIGHT HEART CATH;  Surgeon: Ladona Heinz, MD;  Location: [facility] INVASIVE CV LAB;  Service: Cardiovascular;  Laterality: N/A;   TOOTH EXTRACTION  08/2023   TUBAL LIGATION  05/03/2012   Procedure: POST PARTUM TUBAL LIGATION;  Surgeon: Elveria Mungo, MD;  Location: [facility] ORS;  Service: Gynecology;  Laterality: Bilateral;  with filshie clips    Home Medications:  Prior to Admission medications  Medication Sig Start Date End  Date Taking? Authorizing Provider  amLODipine  (NORVASC ) 2.5 MG tablet TAKE 1 TABLET(2.5 MG) BY MOUTH DAILY Patient taking differently: Take 2.5 mg by mouth daily. 10/14/22  Yes Mallipeddi, Vishnu P, MD  busPIRone  (BUSPAR ) 5 MG tablet Take 5 mg by mouth 2 (two) times daily as needed. for anxiety 03/27/24  Yes [provider]  diphenhydrAMINE -zinc  acetate (BENADRYL ) cream Apply topically 2 (two) times daily as needed for itching. 03/21/24  Yes Odell Celinda Balo, MD  famotidine  (PEPCID ) 20 MG tablet Take 1 tablet by mouth daily. 07/25/23  Yes [provider]  methotrexate  (RHEUMATREX) 2.5 MG tablet TAKE EIGHT TABLETS BY MOUTH ONCE A WEEK Patient taking differently: Take 30 mg by mouth once a week. 04/23/24  Yes Rice, Lonni ORN, MD  ondansetron  (ZOFRAN ) 4 MG tablet Take 1 tablet (4 mg total) by mouth every 6 (six) hours. 04/12/24  Yes Idol, Mliss, PA-C  Tocilizumab  (ACTEMRA  IV) Inject 4 mg/kg into the vein every 28 (twenty-eight) days.   Yes [provider]  traMADol  (ULTRAM ) 50 MG tablet Take 25 mg by mouth as needed for moderate pain (pain score 4-6). 03/27/24  Yes [provider]  traZODone  (DESYREL ) 50 MG tablet Take 0.5 tablets (25 mg total) by mouth at bedtime as needed for sleep. 03/21/24  Yes Odell Celinda Balo, MD    Scheduled Meds:  famotidine   20 mg Oral Daily   heparin   5,000 Units Subcutaneous Q8H   Continuous Infusions:  PRN Meds: acetaminophen , busPIRone , mouth rinse, polyethylene glycol, prochlorperazine , traZODone   Allergies:   Allergies[1]  Social History:   Social History   Socioeconomic History   Marital status: Single    Spouse name: Not on file   Number of children: Not on file   Years of education: Not on file   Highest education level: Not on file  Occupational History   Not on file  Tobacco Use   Smoking status: Never    Passive exposure: Past   Smokeless tobacco: Never  Vaping Use   Vaping status: Never Used   Substance and Sexual Activity   Alcohol use: Not Currently    Alcohol/week: 1.0 standard drink of alcohol    Types: 1 Glasses of wine per week   Drug use: No   Sexual activity: Yes  Other Topics Concern   Not on file  Social History Narrative   Not on file   Social Drivers of Health   Tobacco Use: Low Risk (07/01/2024)   Patient History    Smoking Tobacco Use: Never    Smokeless Tobacco Use: Never    Passive Exposure: Past  Financial Resource Strain: Not on file  Food Insecurity: No Food Insecurity (07/02/2024)   Epic    Worried About Programme Researcher, Broadcasting/film/video in the Last Year: Never true    Ran Out of Food in the Last Year: Never true  Transportation Needs: No Transportation Needs (07/02/2024)   Epic    Lack of Transportation (Medical): No    Lack of Transportation (Non-Medical): No  Physical Activity: Not on file  Stress: Not on file  Social Connections: Unknown (03/16/2024)   Social Connection and Isolation Panel  Frequency of Communication with Friends and Family: Three times a week    Frequency of Social Gatherings with Friends and Family: Three times a week    Attends Religious Services: Never    Active Member of Clubs or Organizations: No    Attends Banker Meetings: Never    Marital Status: Not on file  Intimate Partner Violence: Not At Risk (07/02/2024)   Epic    Fear of Current or Ex-Partner: No    Emotionally Abused: No    Physically Abused: No    Sexually Abused: No  Depression (PHQ2-9): Low Risk (06/04/2024)   Depression (PHQ2-9)    PHQ-2 Score: 0  Alcohol Screen: Not on file  Housing: High Risk (07/02/2024)   Epic    Unable to Pay for Housing in the Last Year: Yes    Number of Times Moved in the Last Year: 0    Homeless in the Last Year: No  Utilities: Not At Risk (07/02/2024)   Epic    Threatened with loss of utilities: No  Health Literacy: Not on file    Family History:   Family History  Problem Relation Age of Onset   Hyperlipidemia Mother     Hypertension Mother    Diabetes type II Mother    Sleep apnea Mother    Breast cancer Mother    Hypertension Father    Ulcerative colitis Father    Vitiligo Paternal Great-grandmother    Lupus Paternal Aunt     ROS:  Please see the history of present illness.  All other ROS reviewed and negative.     Physical Exam/Data: Vitals:   07/02/24 2320 07/03/24 0328 07/03/24 0800 07/03/24 1134  BP: 136/75 122/65 121/69   Pulse: 94 79 90 78  Resp: 20 20 20 17   Temp: 98.4 F (36.9 C) 98.1 F (36.7 C) 98 F (36.7 C) 97.9 F (36.6 C)  TempSrc: Oral Oral Oral Oral  SpO2: 98% 99% 100% 99%  Weight:      Height:       Intake/Output Summary (Last 24 hours) at 07/03/2024 1449 Last data filed at 07/03/2024 1215 Gross per 24 hour  Intake 480 ml  Output --  Net 480 ml      07/01/2024   10:28 PM 06/17/2024   11:28 AM 06/04/2024    7:00 AM  Last 3 Weights  Weight (lbs) 110 lb 110 lb 110 lb 3.7 oz  Weight (kg) 49.896 kg 49.896 kg 50 kg     Body mass index is 18.59 kg/m.   General:  in no acute distress HEENT: normal Neck: no JVD Vascular: No carotid bruits; Distal pulses 2+ bilaterally Cardiac:  normal S1, S2; RRR; no murmur  Lungs:  clear to auscultation bilaterally, no wheezing, rhonchi or rales  Abd: soft, nontender, no hepatomegaly  Ext: no edema Musculoskeletal:  No deformities Skin: warm and dry, hypopigmentation in various locations on face/body, skin changes consistent with scleroderma   Neuro:  no focal abnormalities noted Psych:  Normal affect   EKG:  The EKG was personally reviewed and demonstrates:  sinus rhythm  Relevant CV Studies:  Echocardiogram, 07/02/2024 Left ventricular ejection fraction, by estimation, is 60 to 65% . The left ventricle has normal function. The left ventricle has no regional wall motion abnormalities. Left ventricular diastolic parameters were normal.  Right ventricular systolic function is normal. The right ventricular size is normal. Tricuspid  regurgitation signal is inadequate for assessing PA pressure.  Moderate pericardial effusion overall stable  from 12/ 23/ 25 study. . Moderate pericardial effusion. The pericardial effusion is circumferential. There is no evidence of cardiac tamponade.  The mitral valve is normal in structure. No evidence of mitral valve regurgitation. No evidence of mitral stenosis.  The aortic valve is tricuspid. Aortic valve regurgitation is not visualized. No aortic stenosis is present.  The inferior vena cava is dilated in size with > 50% respiratory variability, suggesting right atrial pressure of 8 mmHg.  Laboratory Data: High Sensitivity Troponin:  No results for input(s): TROPONINIHS in the last 720 hours. No results for input(s): TRNPT in the last 720 hours.    Chemistry Recent Labs  Lab 07/01/24 2251 07/02/24 0503  NA 141 140  K 3.9 3.6  CL 104 104  CO2 30 27  GLUCOSE 91 90  BUN 11 11  CREATININE 0.43* 0.34*  CALCIUM 9.4 9.1  MG  --  2.0  GFRNONAA >60 >60  ANIONGAP 7 9    No results for input(s): PROT, ALBUMIN, AST, ALT, ALKPHOS, BILITOT in the last 168 hours. Lipids No results for input(s): CHOL, TRIG, HDL, LABVLDL, LDLCALC, CHOLHDL in the last 168 hours.  Hematology Recent Labs  Lab 07/01/24 2251 07/02/24 0503  WBC 5.2 5.0  RBC 4.05 3.90  HGB 12.0 11.2*  HCT 37.3 35.4*  MCV 92.1 90.8  MCH 29.6 28.7  MCHC 32.2 31.6  RDW 15.6* 15.4  PLT 268 259   Thyroid   Recent Labs  Lab 07/01/24 2251  TSH 1.850    BNPNo results for input(s): BNP, PROBNP in the last 168 hours.  DDimer No results for input(s): DDIMER in the last 168 hours.  Radiology/Studies:  ECHOCARDIOGRAM COMPLETE Result Date: 07/02/2024    ECHOCARDIOGRAM REPORT   Patient Name:   Erin Higgins Date of Exam: 07/02/2024 Medical Rec #:  [id number]         Height:       64.5 in Accession #:    [id number]        Weight:       110.0 lb Date of Birth:  [birth year]          BSA:          1.526  m Patient Age:    53 years          BP:           114/88 mmHg Patient Gender: F                 HR:           81 bpm. Exam [facility] Procedure: 2D Echo, Cardiac Doppler and Color Doppler (Both Spectral and Color            Flow Doppler were utilized during procedure). Indications:    Pericardial effusion I31.3  History:        Patient has prior history of Echocardiogram examinations, most                 recent 06/18/2024. Risk Factors:Non-Smoker. Hx of Pericardial                 effusion and sclerederma.  Sonographer:    Aida Pizza RCS Referring Phys: [id number] CAROLE N HALL IMPRESSIONS  1. Left ventricular ejection fraction, by estimation, is 60 to 65%. The left ventricle has normal function. The left ventricle has no regional wall motion abnormalities. Left ventricular diastolic parameters were normal.  2. Right ventricular systolic function is normal. The right ventricular size  is normal. Tricuspid regurgitation signal is inadequate for assessing PA pressure.  3. Moderate pericardial effusion overall stable from 06/18/24 study.. Moderate pericardial effusion. The pericardial effusion is circumferential. There is no evidence of cardiac tamponade.  4. The mitral valve is normal in structure. No evidence of mitral valve regurgitation. No evidence of mitral stenosis.  5. The aortic valve is tricuspid. Aortic valve regurgitation is not visualized. No aortic stenosis is present.  6. The inferior vena cava is dilated in size with >50% respiratory variability, suggesting right atrial pressure of 8 mmHg. FINDINGS  Left Ventricle: Left ventricular ejection fraction, by estimation, is 60 to 65%. The left ventricle has normal function. The left ventricle has no regional wall motion abnormalities. The left ventricular internal cavity size was normal in size. There is  no left ventricular hypertrophy. Left ventricular diastolic parameters were normal. Right Ventricle: The right ventricular size is normal. Right  vetricular wall thickness was not well visualized. Right ventricular systolic function is normal. Tricuspid regurgitation signal is inadequate for assessing PA pressure. Left Atrium: Left atrial size was normal in size. Right Atrium: Right atrial size was normal in size. Pericardium: Moderate pericardial effusion overall stable from 06/18/24 study. A moderately sized pericardial effusion is present. The pericardial effusion is circumferential. There is no evidence of cardiac tamponade. Mitral Valve: The mitral valve is normal in structure. No evidence of mitral valve regurgitation. No evidence of mitral valve stenosis. Tricuspid Valve: The tricuspid valve is normal in structure. Tricuspid valve regurgitation is not demonstrated. No evidence of tricuspid stenosis. Aortic Valve: The aortic valve is tricuspid. Aortic valve regurgitation is not visualized. No aortic stenosis is present. Pulmonic Valve: The pulmonic valve was not well visualized. Pulmonic valve regurgitation is not visualized. No evidence of pulmonic stenosis. Aorta: The aortic root is normal in size and structure. Venous: The inferior vena cava is dilated in size with greater than 50% respiratory variability, suggesting right atrial pressure of 8 mmHg. IAS/Shunts: No atrial level shunt detected by color flow Doppler.  LEFT VENTRICLE PLAX 2D LVIDd:         4.80 cm   Diastology LVIDs:         2.90 cm   LV e' medial:    7.34 cm/s LV PW:         0.95 cm   LV E/e' medial:  10.8 LV IVS:        0.90 cm   LV e' lateral:   9.02 cm/s LVOT diam:     1.60 cm   LV E/e' lateral: 8.8 LV SV:         53 LV SV Index:   35 LVOT Area:     2.01 cm  RIGHT VENTRICLE RV S prime:     13.10 cm/s TAPSE (M-mode): 2.4 cm LEFT ATRIUM             Index        RIGHT ATRIUM           Index LA diam:        2.95 cm 1.93 cm/m   RA Area:     11.80 cm LA Vol (A2C):   32.7 ml 21.44 ml/m  RA Volume:   22.60 ml  14.81 ml/m LA Vol (A4C):   37.6 ml 24.65 ml/m LA Biplane Vol: 38.3 ml 25.11  ml/m  AORTIC VALVE LVOT Vmax:   139.67 cm/s LVOT Vmean:  95.067 cm/s LVOT VTI:    0.265 m  AORTA Ao Root diam:  2.80 cm MITRAL VALVE MV Area (PHT): 3.19 cm    SHUNTS MV Decel Time: 238 msec    Systemic VTI:  0.26 m MV E velocity: 79.40 cm/s  Systemic Diam: 1.60 cm MV A velocity: 78.20 cm/s MV E/A ratio:  1.02 Dorn Ross MD Electronically signed by Dorn Ross MD Signature Date/Time: 07/02/2024/2:12:03 PM    Final    DG Chest 2 View Result Date: 07/01/2024 EXAM: 2 VIEW(S) XRAY OF THE CHEST 07/01/2024 11:01:00 PM COMPARISON: 04/12/2024 CLINICAL HISTORY: shortness of breath FINDINGS: LUNGS AND PLEURA: No focal pulmonary opacity. No pleural effusion. No pneumothorax. HEART AND MEDIASTINUM: Cardiomegaly. BONES AND SOFT TISSUES: Small irregular density within left humeral shaft, possibly bone infarct or enchondroma. No acute osseous abnormality. IMPRESSION: 1. Cardiomegaly. Electronically signed by: Elsie Gravely MD 07/01/2024 11:06 PM EST RP Workstation: [id number]   Assessment and Plan:  Pericardial effusion, recurrent s/p pericardiocentesis 02/2024  Echo 02/2024: Large pericardial effusion present Underwent pericardiocentesis 03/19/2024 with 250 cc of fluid drained Cytology showed no malignancy, benign/reactive mesothelial cells  Echo 03/2024: Increase in pericardial fluid from post drainage, moderate pericardial effusion Echo 04/2024: Increased compared to prior study, moderate pericardial effusion Echo 05/2024: Increased compared to prior study, moderate pericardial effusion Echo 06/2024: Moderate pericardial effusion, stable from December 2025 Presented to [facility] ER with increased shortness of breath Sent over to [facility] to undergo workup for possible pericardiocentesis/pericardial window  Patient mains hemodynamically stable, no signs of tamponade clinically If pericardial window is indicated, this would be patients preference to not have to undergo subsequent pericardiocentesis  Will  discuss case with Dr. Sheena, will reach out to CT surgery team to have them evaluate  Per primary Scleroderma Rheumatoid arthritis Raynaud's  GERD Anxiety Hypertension  Risk Assessment/Risk Scores:     For questions or updates, please contact [facility] Please consult [email or web address] for contact info under   Signed, Waddell DELENA Donath, PA-C  07/03/2024 2:49 PM  Patient seen and examined, note reviewed with the signed Advanced Practice Provider. I personally reviewed laboratory data, imaging studies and relevant notes. I independently examined the patient and formulated the important aspects of the plan. I have personally discussed the plan with the patient and/or family. Comments or changes to the note/plan are indicated below.  Patient seen examined by her bedside.  Family visiting during the encounter.   Recurrent pericardial effusion History of scleroderma Rheumatoid arthritis Raynaud's syndrome  Thankfully she does not have any symptoms of cardiac tamponade so this does not need to be drained immediately therefore we will have to discuss long-term solution with the patient knowing her underlying chronic condition will cause recurrent pericardial effusion.  She is agreeable for hearing about the pericardial window and will prefer to stay away from repeated pericardiocentesis..  Will have consulted CT surgery who is going to evaluate the patient.  For now we will monitor her closely.  Blood pressure is at target.  We will continue to follow   Deseree Zemaitis DO, MS [facility] Attending Cardiologist [facility]  [address] [place], [place] [address] [phone] Website: [email or web address]      [1]  Allergies Allergen Reactions   Penicillins Swelling    Has patient had a PCN reaction causing immediate rash, facial/tongue/throat swelling, SOB or lightheadedness with hypotension: No  Has patient had a PCN reaction causing severe rash  involving mucus membranes or skin necrosis: No  Has patient had a PCN reaction that required hospitalization: No  Has patient had  a PCN reaction occurring within the last 10 years: No  If all of the above answers are NO, then may proceed with Cephalosporin use.   Folic Acid  Diarrhea and Nausea And Vomiting   "

## 2024-07-03 NOTE — Consult Note (Addendum)
 "     [address]       [place] [address]             [phone]        Erin Higgins [facility] Medical Record #[id number] Date of Birth: 12-06-1970  Referring: Waddell Donath, PA-C and Dr. Juvenal, DO Primary Care: Shona Norleen PEDLAR, MD Primary Cardiologist:Vishnu SHAUNNA Maywood, MD  Chief Complaint:    Chief Complaint  Patient presents with   Shortness of Breath    History of Present Illness:     This is a 54 year old female with a past medical history of scleroderma, seronegative rheumatoid arthritis, Raynaud's phenomenon, systemic sclerosis, hypertension, and known pericardial effusion (had pericardiocentesis 03/19/2024 and 250 cc of fluid was removed) who presented initially to the [facility] ED on 07/01/2024 with complaints of worsening shortness of breath, worse with exertion, pain in both forearms, and palpitations.  CXR showed cardiomegaly but no acute other abnormality. She was transferred from [facility] to [facility] for further evaluation and treatment.   In the ED, she was tachypneic but not hypoxic.  Echocardiogram done 07/02/2024 showed LVEF 60-65%, no regional wall abnormalities, moderate circumferential pericardial effusion (stable from previous study 06/18/2024), and no tamponade. Cardiothoracic surgery has been consulted regarding pericardial effusion. At the time of my exam, patient was in no acute distress, with family at bedside.  Current Activity/ Functional Status:  Patient is independent with mobility/ambulation, transfers, ADL's, IADL's.   Zubrod Score: At the time of surgery this patients most appropriate activity status/level should be described as: []     0    Normal activity, no symptoms [x]     1    Restricted in physical strenuous activity but ambulatory, able to do out light work []     2    Ambulatory and capable of self care, unable to do work activities, up and about more than 50% of the time                            []     3     Only limited self care, in bed greater than 50% of waking hours []     4    Completely disabled, no self care, confined to bed or chair []     5    Moribund  Past Medical History:  Diagnosis Date   Diverticulitis    Heartburn    Herpes simplex of female genitalia    Scleredema (HCC)    Tapeworm 04/2023    Past Surgical History:  Procedure Laterality Date   COLONOSCOPY     DILATION AND CURETTAGE OF UTERUS     PERICARDIOCENTESIS N/A 03/19/2024   Procedure: PERICARDIOCENTESIS;  Surgeon: Ladona Heinz, MD;  Location: [facility] INVASIVE CV LAB;  Service: Cardiovascular;  Laterality: N/A;   RIGHT HEART CATH N/A 03/19/2024   Procedure: RIGHT HEART CATH;  Surgeon: Ladona Heinz, MD;  Location: [facility] INVASIVE CV LAB;  Service: Cardiovascular;  Laterality: N/A;   TOOTH EXTRACTION  08/2023   TUBAL LIGATION  05/03/2012   Procedure: POST PARTUM TUBAL LIGATION;  Surgeon: Elveria Mungo, MD;  Location: [facility] ORS;  Service: Gynecology;  Laterality: Bilateral;  with filshie clips    Tobacco Use History[1]  Social History   Substance and Sexual Activity  Alcohol Use Not Currently   Alcohol/week: 1.0 standard drink of alcohol   Types: 1 Glasses of wine per week  Allergies[2]  Current Facility-Administered Medications  Medication Dose Route Frequency Provider Last Rate Last Admin   acetaminophen  (TYLENOL ) tablet 500 mg  500 mg Oral Q6H PRN Hall, Carole N, DO       busPIRone  (BUSPAR ) tablet 5 mg  5 mg Oral BID PRN Ricky Fines, MD       famotidine  (PEPCID ) tablet 20 mg  20 mg Oral Daily Shona Laurence N, DO   20 mg at 07/03/24 9140   heparin  injection 5,000 Units  5,000 Units Subcutaneous Q8H Shona Laurence SAILOR, DO   5,000 Units at 07/03/24 1350   Oral care mouth rinse  15 mL Mouth Rinse PRN Vann, Jessica U, DO       polyethylene glycol (MIRALAX  / GLYCOLAX ) packet 17 g  17 g Oral Daily PRN Shona Laurence N, DO       prochlorperazine  (COMPAZINE ) injection 5 mg  5 mg Intravenous Q6H PRN Shona Laurence N, DO        traZODone  (DESYREL ) tablet 25 mg  25 mg Oral QHS PRN Shona Laurence SAILOR, DO        Medications Prior to Admission  Medication Sig Dispense Refill Last Dose/Taking   amLODipine  (NORVASC ) 2.5 MG tablet TAKE 1 TABLET(2.5 MG) BY MOUTH DAILY (Patient taking differently: Take 2.5 mg by mouth daily.) 90 tablet 1 07/01/2024 Morning   busPIRone  (BUSPAR ) 5 MG tablet Take 5 mg by mouth 2 (two) times daily as needed. for anxiety   Unknown   diphenhydrAMINE -zinc  acetate (BENADRYL ) cream Apply topically 2 (two) times daily as needed for itching. 28.4 g 0 Unknown   famotidine  (PEPCID ) 20 MG tablet Take 1 tablet by mouth daily.   07/01/2024 Morning   methotrexate  (RHEUMATREX) 2.5 MG tablet TAKE EIGHT TABLETS BY MOUTH ONCE A WEEK (Patient taking differently: Take 30 mg by mouth once a week.) 96 tablet 0 06/29/2024   ondansetron  (ZOFRAN ) 4 MG tablet Take 1 tablet (4 mg total) by mouth every 6 (six) hours. 12 tablet 0 Unknown   Tocilizumab  (ACTEMRA  IV) Inject 4 mg/kg into the vein every 28 (twenty-eight) days.   Past Month   traMADol  (ULTRAM ) 50 MG tablet Take 25 mg by mouth as needed for moderate pain (pain score 4-6).   Unknown   traZODone  (DESYREL ) 50 MG tablet Take 0.5 tablets (25 mg total) by mouth at bedtime as needed for sleep. 30 tablet 1 Unknown   [EXPIRED] predniSONE  (DELTASONE ) 5 MG tablet Take 4 tablets (20 mg total) by mouth daily with breakfast for 7 days, THEN 3 tablets (15 mg total) daily with breakfast for 14 days, THEN 2 tablets (10 mg total) daily with breakfast for 14 days, THEN 1 tablet (5 mg total) daily with breakfast for 14 days. (Patient not taking: Reported on 07/02/2024) 96 tablet 0 Not Taking    Family History  Problem Relation Age of Onset   Hyperlipidemia Mother    Hypertension Mother    Diabetes type II Mother    Sleep apnea Mother    Breast cancer Mother    Hypertension Father    Ulcerative colitis Father    Vitiligo Paternal Great-grandmother    Lupus Paternal Aunt    Review of  Systems:     Cardiac Review of Systems: Y or  [  N  ]= no   Resting SOB [   ] Exertional SOB  [ Y ]     Pedal Edema [ N  ]    Palpitations [ Y ] Syncope  [ N ]  General Review of Systems: [Y] = yes [ N ]=no Constitional:  nausea [ N ];                          Resp: cough [ N ];   GI:   vomiting[ N ];               Musculoskeletal:   joint pain[ Y ];    Heme/Lymph: anemia[ Y ];  Neuro: TIA[N  ];  stroke[M  ];   Endocrine: diabetes[ N ];    Physical Exam: BP 121/69 (BP Location: Right Arm)   Pulse 78   Temp 97.9 F (36.6 C) (Oral)   Resp 17   Ht 5' 4.5 (1.638 m)   Wt 49.9 kg   SpO2 99%   BMI 18.59 kg/m    General appearance: alert, cooperative, and no distress Head: Normocephalic, without obvious abnormality, atraumatic Neck: supple, symmetrical, trachea midline Resp: clear to auscultation bilaterally Cardio: RRR, no murmur GI: Soft, non tender, bowel sounds present Extremities: No LE edema. Palpable DP/PT bilaterally Neurologic: Grossly normal  Diagnostic Studies & Laboratory data:     Recent Radiology Findings:   ECHOCARDIOGRAM COMPLETE Result Date: 07/02/2024    ECHOCARDIOGRAM REPORT   Patient Name:   SAYGE BRIENZA Date of Exam: 07/02/2024 Medical Rec #:  [id number]         Height:       64.5 in Accession #:    [id number]        Weight:       110.0 lb Date of Birth:  1970-08-15          BSA:          1.526 m Patient Age:    53 years          BP:           114/88 mmHg Patient Gender: F                 HR:           81 bpm. Exam [facility] Procedure: 2D Echo, Cardiac Doppler and Color Doppler (Both Spectral and Color            Flow Doppler were utilized during procedure). Indications:    Pericardial effusion I31.3  History:        Patient has prior history of Echocardiogram examinations, most                 recent 06/18/2024. Risk Factors:Non-Smoker. Hx of Pericardial                 effusion and sclerederma.  Sonographer:    Aida Pizza RCS Referring Phys:  [id number] CAROLE N HALL IMPRESSIONS  1. Left ventricular ejection fraction, by estimation, is 60 to 65%. The left ventricle has normal function. The left ventricle has no regional wall motion abnormalities. Left ventricular diastolic parameters were normal.  2. Right ventricular systolic function is normal. The right ventricular size is normal. Tricuspid regurgitation signal is inadequate for assessing PA pressure.  3. Moderate pericardial effusion overall stable from 06/18/24 study.. Moderate pericardial effusion. The pericardial effusion is circumferential. There is no evidence of cardiac tamponade.  4. The mitral valve is normal in structure. No evidence of mitral valve regurgitation. No evidence of mitral stenosis.  5. The aortic valve is tricuspid. Aortic valve regurgitation is not visualized. No aortic stenosis is present.  6. The inferior  vena cava is dilated in size with >50% respiratory variability, suggesting right atrial pressure of 8 mmHg. FINDINGS  Left Ventricle: Left ventricular ejection fraction, by estimation, is 60 to 65%. The left ventricle has normal function. The left ventricle has no regional wall motion abnormalities. The left ventricular internal cavity size was normal in size. There is  no left ventricular hypertrophy. Left ventricular diastolic parameters were normal. Right Ventricle: The right ventricular size is normal. Right vetricular wall thickness was not well visualized. Right ventricular systolic function is normal. Tricuspid regurgitation signal is inadequate for assessing PA pressure. Left Atrium: Left atrial size was normal in size. Right Atrium: Right atrial size was normal in size. Pericardium: Moderate pericardial effusion overall stable from 06/18/24 study. A moderately sized pericardial effusion is present. The pericardial effusion is circumferential. There is no evidence of cardiac tamponade. Mitral Valve: The mitral valve is normal in structure. No evidence of mitral valve  regurgitation. No evidence of mitral valve stenosis. Tricuspid Valve: The tricuspid valve is normal in structure. Tricuspid valve regurgitation is not demonstrated. No evidence of tricuspid stenosis. Aortic Valve: The aortic valve is tricuspid. Aortic valve regurgitation is not visualized. No aortic stenosis is present. Pulmonic Valve: The pulmonic valve was not well visualized. Pulmonic valve regurgitation is not visualized. No evidence of pulmonic stenosis. Aorta: The aortic root is normal in size and structure. Venous: The inferior vena cava is dilated in size with greater than 50% respiratory variability, suggesting right atrial pressure of 8 mmHg. IAS/Shunts: No atrial level shunt detected by color flow Doppler.  LEFT VENTRICLE PLAX 2D LVIDd:         4.80 cm   Diastology LVIDs:         2.90 cm   LV e' medial:    7.34 cm/s LV PW:         0.95 cm   LV E/e' medial:  10.8 LV IVS:        0.90 cm   LV e' lateral:   9.02 cm/s LVOT diam:     1.60 cm   LV E/e' lateral: 8.8 LV SV:         53 LV SV Index:   35 LVOT Area:     2.01 cm  RIGHT VENTRICLE RV S prime:     13.10 cm/s TAPSE (M-mode): 2.4 cm LEFT ATRIUM             Index        RIGHT ATRIUM           Index LA diam:        2.95 cm 1.93 cm/m   RA Area:     11.80 cm LA Vol (A2C):   32.7 ml 21.44 ml/m  RA Volume:   22.60 ml  14.81 ml/m LA Vol (A4C):   37.6 ml 24.65 ml/m LA Biplane Vol: 38.3 ml 25.11 ml/m  AORTIC VALVE LVOT Vmax:   139.67 cm/s LVOT Vmean:  95.067 cm/s LVOT VTI:    0.265 m  AORTA Ao Root diam: 2.80 cm MITRAL VALVE MV Area (PHT): 3.19 cm    SHUNTS MV Decel Time: 238 msec    Systemic VTI:  0.26 m MV E velocity: 79.40 cm/s  Systemic Diam: 1.60 cm MV A velocity: 78.20 cm/s MV E/A ratio:  1.02 Dorn Ross MD Electronically signed by Dorn Ross MD Signature Date/Time: 07/02/2024/2:12:03 PM    Final    DG Chest 2 View Result Date: 07/01/2024 EXAM: 2 VIEW(S) XRAY OF THE  CHEST 07/01/2024 11:01:00 PM COMPARISON: 04/12/2024 CLINICAL HISTORY:  shortness of breath FINDINGS: LUNGS AND PLEURA: No focal pulmonary opacity. No pleural effusion. No pneumothorax. HEART AND MEDIASTINUM: Cardiomegaly. BONES AND SOFT TISSUES: Small irregular density within left humeral shaft, possibly bone infarct or enchondroma. No acute osseous abnormality. IMPRESSION: 1. Cardiomegaly. Electronically signed by: Elsie Gravely MD 07/01/2024 11:06 PM EST RP Workstation: [id number]     I have independently reviewed the above radiologic studies and discussed with the patient   Recent Lab Findings: Lab Results  Component Value Date   WBC 5.0 07/02/2024   HGB 11.2 (L) 07/02/2024   HCT 35.4 (L) 07/02/2024   PLT 259 07/02/2024   GLUCOSE 90 07/02/2024   CHOL 161 05/07/2024   TRIG 84 05/07/2024   HDL 51 05/07/2024   LDLCALC 94 05/07/2024   ALT 11 05/07/2024   AST 25 05/07/2024   NA 140 07/02/2024   K 3.6 07/02/2024   CL 104 07/02/2024   CREATININE 0.34 (L) 07/02/2024   BUN 11 07/02/2024   CO2 27 07/02/2024   TSH 1.850 07/01/2024   INR 1.1 03/17/2024    Assessment / Plan:   Pericardial effusion (s/p pericardiocentesis September 2025;cytology showed no malignancy, benign/reactive mesothelial cells) Surgeon to evaluate for drainage of pericardial effusion. 2. History of hypertension-on Amlodipine  prior to admission 3. History of seronegative rheumatoid arthritis-on Methotrexate  prior to admission And on Tocilizumab  prior to admission    I  spent 25 minutes counseling the patient face to face.   Donielle Zimmerman PA-C 07/03/2024 3:40 PM  Patient seen and examined, echo images reviewed, agree with findings noted above.  54 yo woman with scleroderma, seronegative rheumatoid arthritis, systemic sclerosis and hypertension.  Noted to have pericardial effusion in September.  Pericardiocentesis drained 250 ml of fluid.  Felt a little better afterwards, but thinks  they didn't get it all.  Now presents with shortness of breath with exertion.  Echo shows a  moderate effusion without evidence of tamponade.  While a window is certainly possible and may provide some benefit, I am not sure her effusion is big enough to explain her degree of dyspnea.  I suspect she would likely get some relief but there is no guarantee of that.  I also made it clear that a window decreases but does not eliminate the possibility of a recurrence.    In notes there is mention of additional cardiac testing including MR.  If that is planned would strongly suggest getting it before a window is done.  She is not in distress and has no signs of tamponade to indicate urgent window.  Would not be able to do until mid to late next week.    Note plan for portacath tomorrow.  If she is able to be discharged after taht I can plan to see her back in the office as an outpatient.  Elspeth MOTE Kerrin, MD [facility] [phone]        [1]  Social History Tobacco Use  Smoking Status Never   Passive exposure: Past  Smokeless Tobacco Never  [2]  Allergies Allergen Reactions   Penicillins Swelling    Has patient had a PCN reaction causing immediate rash, facial/tongue/throat swelling, SOB or lightheadedness with hypotension: No  Has patient had a PCN reaction causing severe rash involving mucus membranes or skin necrosis: No  Has patient had a PCN reaction that required hospitalization: No  Has patient had a PCN reaction occurring within the last 10 years:  No  If all of the above answers are NO, then may proceed with Cephalosporin use.   Folic Acid  Diarrhea and Nausea And Vomiting   "

## 2024-07-03 NOTE — Plan of Care (Signed)
   Problem: Clinical Measurements: Goal: Ability to maintain clinical measurements within normal limits will improve Outcome: Progressing Goal: Will remain free from infection Outcome: Progressing Goal: Diagnostic test results will improve Outcome: Progressing Goal: Cardiovascular complication will be avoided Outcome: Progressing

## 2024-07-03 NOTE — Progress Notes (Addendum)
 Error

## 2024-07-03 NOTE — Progress Notes (Signed)
"  °  Progress Note   Patient: Erin Higgins FMW:[id number] DOB: 1970/10/21 DOA: 07/01/2024     1 DOS: the patient was seen and examined on 07/03/2024   Brief hospital admission narrative: Erin Higgins is a 54 y.o. female with medical history significant for scleroderma, known pericardial effusion, hypertension, GERD, who presents to the ER with complaints of worsening shortness of breath and intermittent palpitations, worse with exertion.  Symptoms started about a week and a half ago.  No reported subjective fevers. [facility], hospitalist service, was asked to admit for possible pericardiocentesis versus pericardial window.  Admitted to progressive care unit at [facility].    Assessment and plan moderate pericardial effusion - Present on admission - Echo: Moderate pericardial effusion overall stable from 06/18/24 study..Moderate pericardial effusion. The pericardial effusion is circumferential. There is no evidence of cardiac tamponade.  - Cardiology consulted for pericardiocentesis versus need of CTS involvement for pericardial window -- Patient is hemodynamically stable and there is no suggestion of tamponade. - Patient just completed treatment of tapering prednisone  as an outpatient.  intermittent palpitations - TSH within normal limits - Continue telemetry monitoring - Follow electrolytes and replete as needed.  history of scleroderma - Continue home medication regimen - Continue patient follow-up with rheumatology service. - Consider vascular consult once cleared by cardiology for port placement  hypertension - Blood pressure soft but stable continue monitoring vital sign  GERD - Continue famotidine .  anxiety - Continue as needed BuSpar .  Subjective:  Overall feeling well, does occasionally have palpitation  Physical Exam: Vitals:   07/02/24 2029 07/02/24 2320 07/03/24 0328 07/03/24 0800  BP: (!) 154/89 136/75 122/65 121/69  Pulse: 89 94 79 90  Resp: 20 20 20 20    Temp: 98.3 F (36.8 C) 98.4 F (36.9 C) 98.1 F (36.7 C) 98 F (36.7 C)  TempSrc: Oral Oral Oral Oral  SpO2: 99% 98% 99% 100%  Weight:      Height:        General: Appearance:    Thin female in no acute distress     Lungs:     respirations unlabored  Heart:    Normal heart rate. Normal rhythm. No murmurs, rubs, or gallops.   MS:   All extremities are intact.   Neurologic:   Awake, alert    No labs this a.m.    Family Communication: Spoke with mother on phone  Disposition: Status is: Inpatient Remains inpatient appropriate because: Cardiology evaluation   Time spent: 35 minutes  Author: Jakaree Pickard U Jayde Daffin, DO 07/03/2024 10:50 AM  For on call review [email or web address].    "

## 2024-07-03 NOTE — Addendum Note (Signed)
 Addended by: NATALIA BIRMINGHAM on: 07/03/2024 02:05 PM   Modules accepted: Orders

## 2024-07-03 NOTE — Telephone Encounter (Addendum)
 Dr.Vann a hospitalist called to advise patient was referred to vascular to have a port placed. Vascular does not place them. Dr. Vann would like to verify  if  it should be total port or catheter procedure. At this time patient is admitted to the hospital. Call back number for Dr. Juvenal is [phone].

## 2024-07-04 ENCOUNTER — Inpatient Hospital Stay (HOSPITAL_COMMUNITY)

## 2024-07-04 ENCOUNTER — Inpatient Hospital Stay (HOSPITAL_BASED_OUTPATIENT_CLINIC_OR_DEPARTMENT_OTHER): Admitting: Physical Therapy

## 2024-07-04 DIAGNOSIS — I3139 Other pericardial effusion (noninflammatory): Secondary | ICD-10-CM | POA: Diagnosis not present

## 2024-07-04 LAB — BASIC METABOLIC PANEL WITH GFR
Anion gap: 9 (ref 5–15)
BUN: 14 mg/dL (ref 6–20)
CO2: 24 mmol/L (ref 22–32)
Calcium: 9.2 mg/dL (ref 8.9–10.3)
Chloride: 106 mmol/L (ref 98–111)
Creatinine, Ser: 0.53 mg/dL (ref 0.44–1.00)
GFR, Estimated: >60 mL/min
Glucose, Bld: 84 mg/dL (ref 70–99)
Potassium: 4 mmol/L (ref 3.5–5.1)
Sodium: 139 mmol/L (ref 135–145)

## 2024-07-04 LAB — CBC
HCT: 37.7 % (ref 36.0–46.0)
Hemoglobin: 11.8 g/dL — ABNORMAL LOW (ref 12.0–15.0)
MCH: 28.6 pg (ref 26.0–34.0)
MCHC: 31.3 g/dL (ref 30.0–36.0)
MCV: 91.3 fL (ref 80.0–100.0)
Platelets: 275 K/uL (ref 150–400)
RBC: 4.13 MIL/uL (ref 3.87–5.11)
RDW: 15.1 % (ref 11.5–15.5)
WBC: 4.5 K/uL (ref 4.0–10.5)
nRBC: 0 % (ref 0.0–0.2)

## 2024-07-04 MED ORDER — GADOBUTROL 1 MMOL/ML IV SOLN
8.0000 mL | Freq: Once | INTRAVENOUS | Status: AC | PRN
  Administered 2024-07-04: 8 mL via INTRAVENOUS

## 2024-07-04 NOTE — Progress Notes (Signed)
 IR asked to evaluate patient for port-a-catheter placement for durable venous access. Procedure discussed with the patient who was agreeable. Physical assessment of the patient revealed tight, thick skin on most of the body. Out of concern for poor wound healing IR recommended placement of a tunneled central venous catheter. Primary team and patient are agreeable to this plan.   Patient tentatively scheduled 07/05/24 for placement of a tunneled central venous catheter in IR. Consent signed and in IR. Patient does not need to be NPO.   Kalyb Pemble, AGACNP-BC 07/04/2024, 1:45 PM

## 2024-07-04 NOTE — Progress Notes (Signed)
 "  Progress Note  Patient Name: Erin Higgins Date of Encounter: 07/04/2024  Primary Cardiologist: Vishnu P Mallipeddi, MD   Subjective   Patient seen and examined at her bedside. She offers no new complaints. Shortness of breath on exertion is still present.    Inpatient Medications    Scheduled Meds:  famotidine   20 mg Oral Daily   heparin   5,000 Units Subcutaneous Q8H   Continuous Infusions:  PRN Meds: acetaminophen , busPIRone , mouth rinse, polyethylene glycol, prochlorperazine , traZODone    Vital Signs    Vitals:   07/03/24 1948 07/03/24 2315 07/04/24 0405 07/04/24 0815  BP: (!) 147/88 118/81 114/77 (!) 119/106  Pulse: 88 82 78 83  Resp: 20 20 19  (!) 22  Temp: 98.5 F (36.9 C) 98 F (36.7 C) 97.9 F (36.6 C) 97.9 F (36.6 C)  TempSrc: Oral Oral Oral Oral  SpO2: 96% 98% 99% 98%  Weight:      Height:        Intake/Output Summary (Last 24 hours) at 07/04/2024 1135 Last data filed at 07/03/2024 1800 Gross per 24 hour  Intake 480 ml  Output --  Net 480 ml   Filed Weights   07/01/24 2228  Weight: 49.9 kg    Telemetry    Sinus rhythm- Personally Reviewed  ECG    - Personally Reviewed  Physical Exam   General: Comfortable Head: Atraumatic, normal size  Eyes: PEERLA, EOMI  Neck: Supple, normal JVD Cardiac: Normal S1, S2; RRR; no murmurs, rubs, or gallops Lungs: Clear to auscultation bilaterally Abd: Soft, nontender, no hepatomegaly  Ext: warm, no edema Musculoskeletal: No deformities, BUE and BLE strength normal and equal Skin: Warm and dry, no rashes   Neuro: Alert and oriented to person, place, time, and situation, CNII-XII grossly intact, no focal deficits  Psych: Normal mood and affect   Labs    Chemistry Recent Labs  Lab 07/01/24 2251 07/02/24 0503 07/04/24 0404  NA 141 140 139  K 3.9 3.6 4.0  CL 104 104 106  CO2 30 27 24   GLUCOSE 91 90 84  BUN 11 11 14   CREATININE 0.43* 0.34* 0.53  CALCIUM 9.4 9.1 9.2  GFRNONAA >60 >60 >60   ANIONGAP 7 9 9      Hematology Recent Labs  Lab 07/01/24 2251 07/02/24 0503 07/04/24 0404  WBC 5.2 5.0 4.5  RBC 4.05 3.90 4.13  HGB 12.0 11.2* 11.8*  HCT 37.3 35.4* 37.7  MCV 92.1 90.8 91.3  MCH 29.6 28.7 28.6  MCHC 32.2 31.6 31.3  RDW 15.6* 15.4 15.1  PLT 268 259 275    Cardiac EnzymesNo results for input(s): TROPONINI in the last 168 hours. No results for input(s): TROPIPOC in the last 168 hours.   BNPNo results for input(s): BNP, PROBNP in the last 168 hours.   DDimer No results for input(s): DDIMER in the last 168 hours.   Radiology    ECHOCARDIOGRAM COMPLETE Result Date: 07/02/2024    ECHOCARDIOGRAM REPORT   Patient Name:   CHELISE HANGER Date of Exam: 07/02/2024 Medical Rec #:  [id number]         Height:       64.5 in Accession #:    [id number]        Weight:       110.0 lb Date of Birth:  May 08, 1971          BSA:          1.526 m Patient Age:    53 years  BP:           114/88 mmHg Patient Gender: F                 HR:           81 bpm. Exam Location:  [facility] Procedure: 2D Echo, Cardiac Doppler and Color Doppler (Both Spectral and Color            Flow Doppler were utilized during procedure). Indications:    Pericardial effusion I31.3  History:        Patient has prior history of Echocardiogram examinations, most                 recent 06/18/2024. Risk Factors:Non-Smoker. Hx of Pericardial                 effusion and sclerederma.  Sonographer:    Aida Pizza RCS Referring Phys: [id number] CAROLE N HALL IMPRESSIONS  1. Left ventricular ejection fraction, by estimation, is 60 to 65%. The left ventricle has normal function. The left ventricle has no regional wall motion abnormalities. Left ventricular diastolic parameters were normal.  2. Right ventricular systolic function is normal. The right ventricular size is normal. Tricuspid regurgitation signal is inadequate for assessing PA pressure.  3. Moderate pericardial effusion overall stable from 06/18/24 study..  Moderate pericardial effusion. The pericardial effusion is circumferential. There is no evidence of cardiac tamponade.  4. The mitral valve is normal in structure. No evidence of mitral valve regurgitation. No evidence of mitral stenosis.  5. The aortic valve is tricuspid. Aortic valve regurgitation is not visualized. No aortic stenosis is present.  6. The inferior vena cava is dilated in size with >50% respiratory variability, suggesting right atrial pressure of 8 mmHg. FINDINGS  Left Ventricle: Left ventricular ejection fraction, by estimation, is 60 to 65%. The left ventricle has normal function. The left ventricle has no regional wall motion abnormalities. The left ventricular internal cavity size was normal in size. There is  no left ventricular hypertrophy. Left ventricular diastolic parameters were normal. Right Ventricle: The right ventricular size is normal. Right vetricular wall thickness was not well visualized. Right ventricular systolic function is normal. Tricuspid regurgitation signal is inadequate for assessing PA pressure. Left Atrium: Left atrial size was normal in size. Right Atrium: Right atrial size was normal in size. Pericardium: Moderate pericardial effusion overall stable from 06/18/24 study. A moderately sized pericardial effusion is present. The pericardial effusion is circumferential. There is no evidence of cardiac tamponade. Mitral Valve: The mitral valve is normal in structure. No evidence of mitral valve regurgitation. No evidence of mitral valve stenosis. Tricuspid Valve: The tricuspid valve is normal in structure. Tricuspid valve regurgitation is not demonstrated. No evidence of tricuspid stenosis. Aortic Valve: The aortic valve is tricuspid. Aortic valve regurgitation is not visualized. No aortic stenosis is present. Pulmonic Valve: The pulmonic valve was not well visualized. Pulmonic valve regurgitation is not visualized. No evidence of pulmonic stenosis. Aorta: The aortic root is  normal in size and structure. Venous: The inferior vena cava is dilated in size with greater than 50% respiratory variability, suggesting right atrial pressure of 8 mmHg. IAS/Shunts: No atrial level shunt detected by color flow Doppler.  LEFT VENTRICLE PLAX 2D LVIDd:         4.80 cm   Diastology LVIDs:         2.90 cm   LV e' medial:    7.34 cm/s LV PW:  0.95 cm   LV E/e' medial:  10.8 LV IVS:        0.90 cm   LV e' lateral:   9.02 cm/s LVOT diam:     1.60 cm   LV E/e' lateral: 8.8 LV SV:         53 LV SV Index:   35 LVOT Area:     2.01 cm  RIGHT VENTRICLE RV S prime:     13.10 cm/s TAPSE (M-mode): 2.4 cm LEFT ATRIUM             Index        RIGHT ATRIUM           Index LA diam:        2.95 cm 1.93 cm/m   RA Area:     11.80 cm LA Vol (A2C):   32.7 ml 21.44 ml/m  RA Volume:   22.60 ml  14.81 ml/m LA Vol (A4C):   37.6 ml 24.65 ml/m LA Biplane Vol: 38.3 ml 25.11 ml/m  AORTIC VALVE LVOT Vmax:   139.67 cm/s LVOT Vmean:  95.067 cm/s LVOT VTI:    0.265 m  AORTA Ao Root diam: 2.80 cm MITRAL VALVE MV Area (PHT): 3.19 cm    SHUNTS MV Decel Time: 238 msec    Systemic VTI:  0.26 m MV E velocity: 79.40 cm/s  Systemic Diam: 1.60 cm MV A velocity: 78.20 cm/s MV E/A ratio:  1.02 Dorn Ross MD Electronically signed by Dorn Ross MD Signature Date/Time: 07/02/2024/2:12:03 PM    Final     Cardiac Studies   Echocardiogram  Patient Profile     54 y.o. female with underlying inflammatory disease and pericardial effusion  Assessment & Plan    Recurrent pericardial effusion History of scleroderma Rheumatoid arthritis Raynaud's syndrome  Thankfully she has not has any concern symptoms since admission. She is still awaiting CT surgery evaluation for the pericardial window for her recurrent pericardial effusion.   For now we will monitor her closely.   Blood pressure is at target.   We will continue to follow.  For questions or updates, please contact [facility] Please consult  [email or web address] for contact info under Cardiology/STEMI.      Signed, Naethan Bracewell, DO  07/04/2024, 11:35 AM    "

## 2024-07-04 NOTE — Progress Notes (Signed)
"  °  Progress Note   Patient: Erin Higgins FMW:[id number] DOB: 04-03-1971 DOA: 07/01/2024     2 DOS: the patient was seen and examined on 07/04/2024   Brief hospital admission narrative: Erin Higgins is a 54 y.o. female with medical history significant for scleroderma, known pericardial effusion, hypertension, GERD, who presents to the ER with complaints of worsening shortness of breath and intermittent palpitations, worse with exertion.  Symptoms started about a week and a half ago.  No reported subjective fevers. [facility], hospitalist service, was asked to admit for possible pericardiocentesis versus pericardial window.  Admitted to progressive care unit at [facility].    Assessment and plan moderate pericardial effusion - Present on admission - Echo: Moderate pericardial effusion overall stable from 06/18/24 study..Moderate pericardial effusion. The pericardial effusion is circumferential. There is no evidence of cardiac tamponade.  - Cardiology consulted as well as CVTS -- Patient is hemodynamically stable and there is no suggestion of tamponade. - Patient just completed treatment of tapering prednisone  as an outpatient.  intermittent palpitations - TSH within normal limits - Continue telemetry monitoring - Follow electrolytes and replete as needed.  history of scleroderma - Continue home medication regimen - Continue patient follow-up with rheumatology service. - needs port placed-- will consult IR  hypertension - Blood pressure soft but stable continue monitoring vital sign  GERD - Continue famotidine .  anxiety - Continue as needed BuSpar .     Subjective:  Some anxiety  Physical Exam: Vitals:   07/03/24 1948 07/03/24 2315 07/04/24 0405 07/04/24 0815  BP: (!) 147/88 118/81 114/77 (!) 119/106  Pulse: 88 82 78 83  Resp: 20 20 19  (!) 22  Temp: 98.5 F (36.9 C) 98 F (36.7 C) 97.9 F (36.6 C) 97.9 F (36.6 C)  TempSrc: Oral Oral Oral Oral  SpO2: 96% 98%  99% 98%  Weight:      Height:         General: Appearance:    Thin female in no acute distress     Lungs:     respirations unlabored  Heart:    Normal heart rate.    MS:   All extremities are intact.   Neurologic:   Awake, alert    Labs stable    Family Communication: Spoke with mother on phone  Disposition: Status is: Inpatient Remains inpatient appropriate because: Cardiology evaluation   Time spent: 35 minutes  Author: Shavontae Gibeault U Jabe Jeanbaptiste, DO 07/04/2024 11:43 AM  For on call review [email or web address].    "

## 2024-07-04 NOTE — Plan of Care (Signed)

## 2024-07-05 ENCOUNTER — Other Ambulatory Visit (HOSPITAL_COMMUNITY): Payer: Self-pay

## 2024-07-05 ENCOUNTER — Inpatient Hospital Stay (HOSPITAL_COMMUNITY)

## 2024-07-05 DIAGNOSIS — I3139 Other pericardial effusion (noninflammatory): Secondary | ICD-10-CM | POA: Diagnosis not present

## 2024-07-05 HISTORY — PX: IR TUNNELED CENTRAL VENOUS CATH PLC W IMG: IMG1939

## 2024-07-05 MED ORDER — BUSPIRONE HCL 5 MG PO TABS
5.0000 mg | ORAL_TABLET | Freq: Two times a day (BID) | ORAL | 0 refills | Status: AC | PRN
  Filled 2024-07-05: qty 30, 15d supply, fill #0

## 2024-07-05 MED ORDER — LIDOCAINE-EPINEPHRINE 1 %-1:100000 IJ SOLN: 20.0000 mL | Freq: Once | INTRAMUSCULAR | Status: DC

## 2024-07-05 MED ORDER — LIDOCAINE-EPINEPHRINE 1 %-1:100000 IJ SOLN
30.0000 mL | Freq: Once | INTRAMUSCULAR | Status: AC
  Administered 2024-07-05: 10 mL via INTRADERMAL

## 2024-07-05 MED ORDER — HEPARIN SOD (PORK) LOCK FLUSH 100 UNIT/ML IV SOLN
250.0000 [IU] | INTRAVENOUS | Status: AC | PRN
  Administered 2024-07-05: 250 [IU]

## 2024-07-05 MED ORDER — LIDOCAINE-EPINEPHRINE 1 %-1:100000 IJ SOLN
10.0000 mL | Freq: Once | INTRAMUSCULAR | Status: DC
  Filled 2024-07-05: qty 20

## 2024-07-05 MED ORDER — LIDOCAINE-EPINEPHRINE 1 %-1:100000 IJ SOLN
INTRAMUSCULAR | Status: AC
  Filled 2024-07-05: qty 20

## 2024-07-05 NOTE — Progress Notes (Signed)
 "  Progress Note  Patient Name: Erin Higgins Date of Encounter: 07/05/2024  Primary Cardiologist: Vishnu P Mallipeddi, MD   Subjective   Patient seen and examined at her bedside.   Inpatient Medications    Scheduled Meds:  famotidine   20 mg Oral Daily   heparin   5,000 Units Subcutaneous Q8H   lidocaine -EPINEPHrine   10 mL Intradermal Once   Continuous Infusions:  PRN Meds: acetaminophen , busPIRone , mouth rinse, polyethylene glycol, prochlorperazine , traZODone    Vital Signs    Vitals:   07/05/24 0323 07/05/24 0735 07/05/24 0909 07/05/24 0932  BP: 120/68 (!) 127/95 (!) 140/93 132/86  Pulse: 72 84 73 74  Resp: 13 20 20 12   Temp: 97.9 F (36.6 C) 98.4 F (36.9 C) 97.6 F (36.4 C)   TempSrc: Oral Oral Oral   SpO2: 100% 97% 99%   Weight:      Height:       No intake or output data in the 24 hours ending 07/05/24 0948  Filed Weights   07/01/24 2228  Weight: 49.9 kg    Telemetry    Sinus rhythm- Personally Reviewed  ECG    - Personally Reviewed  Physical Exam   General: Comfortable Head: Atraumatic, normal size  Eyes: PEERLA, EOMI  Neck: Supple, normal JVD Cardiac: Normal S1, S2; RRR; no murmurs, rubs, or gallops Lungs: Clear to auscultation bilaterally Abd: Soft, nontender, no hepatomegaly  Ext: warm, no edema Musculoskeletal: No deformities, BUE and BLE strength normal and equal Skin: Warm and dry, no rashes   Neuro: Alert and oriented to person, place, time, and situation, CNII-XII grossly intact, no focal deficits  Psych: Normal mood and affect   Labs    Chemistry Recent Labs  Lab 07/01/24 2251 07/02/24 0503 07/04/24 0404  NA 141 140 139  K 3.9 3.6 4.0  CL 104 104 106  CO2 30 27 24   GLUCOSE 91 90 84  BUN 11 11 14   CREATININE 0.43* 0.34* 0.53  CALCIUM 9.4 9.1 9.2  GFRNONAA >60 >60 >60  ANIONGAP 7 9 9      Hematology Recent Labs  Lab 07/01/24 2251 07/02/24 0503 07/04/24 0404  WBC 5.2 5.0 4.5  RBC 4.05 3.90 4.13  HGB 12.0  11.2* 11.8*  HCT 37.3 35.4* 37.7  MCV 92.1 90.8 91.3  MCH 29.6 28.7 28.6  MCHC 32.2 31.6 31.3  RDW 15.6* 15.4 15.1  PLT 268 259 275    Cardiac EnzymesNo results for input(s): TROPONINI in the last 168 hours. No results for input(s): TROPIPOC in the last 168 hours.   BNPNo results for input(s): BNP, PROBNP in the last 168 hours.   DDimer No results for input(s): DDIMER in the last 168 hours.   Radiology    IR TUNNELED CENTRAL VENOUS CATH [facility] W IMG Result Date: 07/05/2024 INDICATION: 54 year old female with history of scleroderma requiring central venous access for frequent transfusions. EXAM: 1. Ultrasound-guided venipuncture of the jugular vein 2. Fluoroscopic guided placement of tunneled central venous catheter MEDICATIONS: None. ANESTHESIA/SEDATION: Local anesthesia only. FLUOROSCOPY TIME:  One mGy reference ir coma COMPLICATIONS: None immediate. PROCEDURE: Informed written consent was obtained from the patient after a discussion of the risks, benefits, and alternatives to treatment. Questions regarding the procedure were encouraged and answered. The right neck and chest were prepped with chlorhexidine in a sterile fashion, and a sterile drape was applied covering the operative field. Maximum barrier sterile technique with sterile gowns and gloves were used for the procedure. A timeout was performed prior to  the initiation of the procedure. After creating a small venotomy incision, a 21 gauge micropuncture kit was utilized to access the internal jugular vein. Real-time ultrasound guidance was utilized for vascular access including the acquisition of a permanent ultrasound image documenting patency of the accessed vessel. A Mandril wire to the level of the cavoatrial junction. A 5 French, single-lumen tunneled central venous catheter measuring 23 cm from tip to cuff was tunneled in a retrograde fashion from the anterior chest wall to the venotomy incision. A peel-away sheath was placed  over the wire. The catheter was then placed through the peel-away sheath with the catheter tip ultimately positioned at the cavoatrial junction. Final catheter positioning was confirmed and documented with a spot radiographic image. The catheter aspirates and flushes normally. The catheter was flushed with appropriate volume heparin  dwells. The catheter exit site was secured with a 0 silk retention suture. The venotomy incision was closed with Dermabond. Sterile dressings were applied. The patient tolerated the procedure well without immediate post procedural complication. IMPRESSION: Successful placement of 23 cm tip to cuff single image tunneled central venous catheter via the right internal jugular vein with catheter tip terminating at the cavoatrial junction. The catheter is ready for immediate use. Ester Sides, MD [facility] Radiology Electronically Signed   By: Ester Sides M.D.   On: 07/05/2024 09:03   MR CARDIAC MORPHOLOGY W WO CONTRAST Result Date: 07/04/2024 CLINICAL DATA:  Clinical question of pericardial effusion Study assumes HCT of 38 and BSA of 1.51 m2. EXAM: CARDIAC MRI TECHNIQUE: The patient was scanned on a 1.5 Tesla GE magnet. A dedicated cardiac coil was used. Functional imaging was done using Fiesta sequences. 2,3, and 4 chamber views were done to assess for RWMA's. Modified Simpson's rule using was used to calculate an ejection fraction on a dedicated work [employer]. The patient received 8 cc of Gadavist . After 10 minutes inversion recovery sequences were used to assess for infiltration and scar tissue. Flow quantification was performed 2 times during this examination with flow quantification performed at the levels of the ascending aorta above the valve, pulmonary artery above the valve. CONTRAST:  8 cc  of Gadavist  FINDINGS: 1. Normal left ventricular size, with LVEDD 44 mm, and LVEDVi 69 mL/m2. Normal left ventricular  thickness. Mild decrease in left ventricular systolic function (LVEF =47%). There are no regional wall motion abnormalities but global hypokinesis. Left ventricular parametric mapping notable for elevated T2 qualitatively but 54 ms (normal) on T2 mapping. ECV elevated 40% in the inferior apex. There is no late gadolinium enhancement in the left ventricular myocardium. Normal resting perfusion. 2. Normal right ventricular size with RVEDVI 74 mL/m2. Normal right ventricular thickness. Mild decrease in right ventricular systolic function (RVEF =44%). There are no regional wall motion abnormalities or aneurysms. There is no RV collapse. 3.  Normal left and right atrial size. 4. Normal size of the aortic root, ascending aorta and pulmonary artery. 5. Valve assessment: Aortic Valve: Tri-leaflet aortic valve. Qualitatively, there is no significant regurgitation. Regurgitant fraction < 1%. Pulmonic Valve: Qualitatively, there is no significant regurgitation. Regurgitant fraction < 1%. Tricuspid Valve: Tri-leaflet valve. Qualitatively, there is no significant regurgitation. Regurgitant fraction 6%. Mitral Valve: Qualitatively, there is no significant regurgitation. Regurgitant fraction 1%. 6. Normal pericardium without pericardial thickening. There is no pericardial hyper-enhancement. There is no IVC plethora, abnormal interventricular wall motion, or collapse of the right atrium or ventricle. There is a moderate pericardial effusion. Though the effusion  is circumferential; there is an apical predominance. Apical epicardial fat noted. 7. Grossly, no extracardiac findings. Small (3 mm) subpleural right middle lung nodule. Recommended dedicated study if concerned for non-cardiac pathology. 8.  Free breathing artifacts noted. IMPRESSION: 1. Mild decrease in left ventricular systolic function (LVEF =47%). Evidence of edema visually on T2 TSE sequence. 2. Mild decrease in right ventricular systolic function (RVEF =44%). 3.  Moderate, circumferential pericardial effusion with apical predominance. [name] MD Electronically Signed   By: [name] M.D.   On: 07/04/2024 21:21   MR CARDIAC VELOCITY FLOW MAP Result Date: 07/04/2024 CLINICAL DATA:  Clinical question of pericardial effusion Study assumes HCT of 38 and BSA of 1.51 m2. EXAM: CARDIAC MRI TECHNIQUE: The patient was scanned on a 1.5 Tesla GE magnet. A dedicated cardiac coil was used. Functional imaging was done using Fiesta sequences. 2,3, and 4 chamber views were done to assess for RWMA's. Modified Simpson's rule using was used to calculate an ejection fraction on a dedicated work [employer]. The patient received 8 cc of Gadavist . After 10 minutes inversion recovery sequences were used to assess for infiltration and scar tissue. Flow quantification was performed 2 times during this examination with flow quantification performed at the levels of the ascending aorta above the valve, pulmonary artery above the valve. CONTRAST:  8 cc  of Gadavist  FINDINGS: 1. Normal left ventricular size, with LVEDD 44 mm, and LVEDVi 69 mL/m2. Normal left ventricular thickness. Mild decrease in left ventricular systolic function (LVEF =47%). There are no regional wall motion abnormalities but global hypokinesis. Left ventricular parametric mapping notable for elevated T2 qualitatively but 54 ms (normal) on T2 mapping. ECV elevated 40% in the inferior apex. There is no late gadolinium enhancement in the left ventricular myocardium. Normal resting perfusion. 2. Normal right ventricular size with RVEDVI 74 mL/m2. Normal right ventricular thickness. Mild decrease in right ventricular systolic function (RVEF =44%). There are no regional wall motion abnormalities or aneurysms. There is no RV collapse. 3.  Normal left and right atrial size. 4. Normal size of the aortic root, ascending aorta and pulmonary artery. 5. Valve assessment: Aortic Valve: Tri-leaflet  aortic valve. Qualitatively, there is no significant regurgitation. Regurgitant fraction < 1%. Pulmonic Valve: Qualitatively, there is no significant regurgitation. Regurgitant fraction < 1%. Tricuspid Valve: Tri-leaflet valve. Qualitatively, there is no significant regurgitation. Regurgitant fraction 6%. Mitral Valve: Qualitatively, there is no significant regurgitation. Regurgitant fraction 1%. 6. Normal pericardium without pericardial thickening. There is no pericardial hyper-enhancement. There is no IVC plethora, abnormal interventricular wall motion, or collapse of the right atrium or ventricle. There is a moderate pericardial effusion. Though the effusion is circumferential; there is an apical predominance. Apical epicardial fat noted. 7. Grossly, no extracardiac findings. Small (3 mm) subpleural right middle lung nodule. Recommended dedicated study if concerned for non-cardiac pathology. 8.  Free breathing artifacts noted. IMPRESSION: 1. Mild decrease in left ventricular systolic function (LVEF =47%). Evidence of edema visually on T2 TSE sequence. 2. Mild decrease in right ventricular systolic function (RVEF =44%). 3. Moderate, circumferential pericardial effusion with apical predominance. [name] MD Electronically Signed   By: [name] M.D.   On: 07/04/2024 21:21   MR CARDIAC VELOCITY FLOW MAP Result Date: 07/04/2024 CLINICAL DATA:  Clinical question of pericardial effusion Study assumes HCT of 38 and BSA of 1.51 m2. EXAM: CARDIAC MRI TECHNIQUE: The patient was scanned on a 1.5 Tesla GE magnet. A dedicated cardiac coil was used. Functional  imaging was done using Fiesta sequences. 2,3, and 4 chamber views were done to assess for RWMA's. Modified Simpson's rule using was used to calculate an ejection fraction on a dedicated work [employer]. The patient received 8 cc of Gadavist . After 10 minutes inversion recovery sequences were used to assess for infiltration  and scar tissue. Flow quantification was performed 2 times during this examination with flow quantification performed at the levels of the ascending aorta above the valve, pulmonary artery above the valve. CONTRAST:  8 cc  of Gadavist  FINDINGS: 1. Normal left ventricular size, with LVEDD 44 mm, and LVEDVi 69 mL/m2. Normal left ventricular thickness. Mild decrease in left ventricular systolic function (LVEF =47%). There are no regional wall motion abnormalities but global hypokinesis. Left ventricular parametric mapping notable for elevated T2 qualitatively but 54 ms (normal) on T2 mapping. ECV elevated 40% in the inferior apex. There is no late gadolinium enhancement in the left ventricular myocardium. Normal resting perfusion. 2. Normal right ventricular size with RVEDVI 74 mL/m2. Normal right ventricular thickness. Mild decrease in right ventricular systolic function (RVEF =44%). There are no regional wall motion abnormalities or aneurysms. There is no RV collapse. 3.  Normal left and right atrial size. 4. Normal size of the aortic root, ascending aorta and pulmonary artery. 5. Valve assessment: Aortic Valve: Tri-leaflet aortic valve. Qualitatively, there is no significant regurgitation. Regurgitant fraction < 1%. Pulmonic Valve: Qualitatively, there is no significant regurgitation. Regurgitant fraction < 1%. Tricuspid Valve: Tri-leaflet valve. Qualitatively, there is no significant regurgitation. Regurgitant fraction 6%. Mitral Valve: Qualitatively, there is no significant regurgitation. Regurgitant fraction 1%. 6. Normal pericardium without pericardial thickening. There is no pericardial hyper-enhancement. There is no IVC plethora, abnormal interventricular wall motion, or collapse of the right atrium or ventricle. There is a moderate pericardial effusion. Though the effusion is circumferential; there is an apical predominance. Apical epicardial fat noted. 7. Grossly, no extracardiac findings. Small (3 mm)  subpleural right middle lung nodule. Recommended dedicated study if concerned for non-cardiac pathology. 8.  Free breathing artifacts noted. IMPRESSION: 1. Mild decrease in left ventricular systolic function (LVEF =47%). Evidence of edema visually on T2 TSE sequence. 2. Mild decrease in right ventricular systolic function (RVEF =44%). 3. Moderate, circumferential pericardial effusion with apical predominance. [name] MD Electronically Signed   By: [name] M.D.   On: 07/04/2024 21:21    Cardiac Studies   Echocardiogram  Patient Profile     54 y.o. female with underlying inflammatory disease and pericardial effusion  Assessment & Plan    Recurrent pericardial effusion History of scleroderma Rheumatoid arthritis Raynaud's syndrome  Thankfully she has not has any concern symptoms since admission. Seen by CT surgery and her window planned for next week. Cardiac MR, mildly depressed ejection fraction with no LGE. Will wait to start GDMT in the outpatient after her procedure.  Will restart her amlodipine  blood pressure is slightly increasing.   Ok to discharge to home post port a cath.   Will need scheduling with CT surgery for her procedure next week.    For questions or updates, please contact [facility] Please consult [email or web address] for contact info under Cardiology/STEMI.      Signed, Milan Perkins, DO  07/05/2024, 9:48 AM    "

## 2024-07-05 NOTE — Procedures (Signed)
 Interventional Radiology Procedure Note  Procedure: Tunneled central venous catheter placement   Findings: Please refer to procedural dictation for full description. 5 Fr single lumen, 23 cm Powerline via right internal jugular.  Complications: None immediate  Estimated Blood Loss: <5 mL  Recommendations: Catheter ready for immediate use.   Ester Sides, MD

## 2024-07-05 NOTE — Progress Notes (Signed)
 Discharge instruction and IV removed by Zara ,RN For her dressing change in her catheter site RN asked  Kristi,case manger and she  suggested that the patient should ask her Rheumatologist to put an order to change the dressing in the infusion clinic ,told the same to the  patient she  verbalizes understanding  IV Nurse changed the dressing as there was small portion of blood in the insertion site as per IR verbal order During this time call made by this RN to IR for dressing change instructions and they said infusion clinic will do it in her infusion days Pt is being transported by her family to home ,all questions were answered

## 2024-07-05 NOTE — Discharge Summary (Signed)
 "     Physician Discharge Summary  Erin Higgins FMW:[id number] DOB: 1970/07/04 DOA: 07/01/2024  PCP: Shona Norleen PEDLAR, MD  Admit date: 07/01/2024 Discharge date: 07/05/2024  Admitted From:  Discharge disposition: Home   Recommendations for Outpatient Follow-Up:   Patient to follow-up with Dr. Kerrin next week for follow-up and possible window   Discharge Diagnosis:   Principal Problem:   Pericardial effusion    Discharge Condition: Improved.  Diet recommendation:   Regular.  Wound care: None.  Code status: Full.   History of Present Illness:   Erin Higgins is a 54 y.o. female with medical history significant for scleroderma, known pericardial effusion, hypertension, GERD, who presents to the ER with complaints of worsening shortness of breath and intermittent palpitations, worse with exertion.  Symptoms started about a week and a half ago.  No reported subjective fevers.  Endorses feeling cold most of the time and generalized weakness.   In the ER, tachypneic with respiratory rate of 26.  Not hypoxic with O2 saturation of 100% on room air.  Lab studies were unrevealing.  A chest x-ray showed cardiomegaly with concern for possible moderate pericardial effusion.   TRH, hospitalist service, was asked to admit for possible pericardiocentesis versus pericardial window by cardiothoracic surgery.  Admitted to progressive care unit at [facility] Course by Problem:   moderate pericardial effusion - Present on admission - Echo: Moderate pericardial effusion overall stable from 06/18/24 study..Moderate pericardial effusion. The pericardial effusion is circumferential. There is no evidence of cardiac tamponade.  - Cardiology consulted as well as CVTS-CVTS plans to follow-up outpatient for possible peer cardial window as patient is asymptomatic -- Patient is hemodynamically stable and there is no suggestion of tamponade. - Patient just completed  treatment of tapering prednisone  as an outpatient.   intermittent palpitations - TSH within normal limits    history of scleroderma - Continue home medication regimen - Continue patient follow-up with rheumatology service. - Patient was seen by IR and tunneled catheter placed, it was decided by IR not to place port due to concern of not healing   hypertension - Resume Norvasc    GERD - Continue famotidine .   anxiety - Continue as needed BuSpar .    Medical Consultants:    Cardiology Cardiovascular surgery  Discharge Exam:   Vitals:   07/05/24 1132 07/05/24 1424  BP: 125/74   Pulse: 86   Resp: 19 18  Temp: 98.4 F (36.9 C)   SpO2: 95%    Vitals:   07/05/24 1034 07/05/24 1100 07/05/24 1132 07/05/24 1424  BP: (!) 132/96 120/75 125/74   Pulse: 85 78 86   Resp: (!) 21 18 19 18   Temp:   98.4 F (36.9 C)   TempSrc:   Oral   SpO2:   95%   Weight:      Height:        General exam: Appears calm and comfortable.   The results of significant diagnostics from this hospitalization (including imaging, microbiology, ancillary and laboratory) are listed below for reference.     Procedures and Diagnostic Studies:   ECHOCARDIOGRAM COMPLETE Result Date: 07/02/2024    ECHOCARDIOGRAM REPORT   Patient Name:   Erin Higgins Date of Exam: 07/02/2024 Medical Rec #:  [id number]         Height:       64.5 in Accession #:    [id number]        Weight:  110.0 lb Date of Birth:  05-24-1971          BSA:          1.526 m Patient Age:    53 years          BP:           114/88 mmHg Patient Gender: F                 HR:           81 bpm. Exam [facility] Procedure: 2D Echo, Cardiac Doppler and Color Doppler (Both Spectral and Color            Flow Doppler were utilized during procedure). Indications:    Pericardial effusion I31.3  History:        Patient has prior history of Echocardiogram examinations, most                 recent 06/18/2024. Risk Factors:Non-Smoker. Hx of  Pericardial                 effusion and sclerederma.  Sonographer:    Aida Pizza RCS Referring Phys: [id number] CAROLE N HALL IMPRESSIONS  1. Left ventricular ejection fraction, by estimation, is 60 to 65%. The left ventricle has normal function. The left ventricle has no regional wall motion abnormalities. Left ventricular diastolic parameters were normal.  2. Right ventricular systolic function is normal. The right ventricular size is normal. Tricuspid regurgitation signal is inadequate for assessing PA pressure.  3. Moderate pericardial effusion overall stable from 06/18/24 study.. Moderate pericardial effusion. The pericardial effusion is circumferential. There is no evidence of cardiac tamponade.  4. The mitral valve is normal in structure. No evidence of mitral valve regurgitation. No evidence of mitral stenosis.  5. The aortic valve is tricuspid. Aortic valve regurgitation is not visualized. No aortic stenosis is present.  6. The inferior vena cava is dilated in size with >50% respiratory variability, suggesting right atrial pressure of 8 mmHg. FINDINGS  Left Ventricle: Left ventricular ejection fraction, by estimation, is 60 to 65%. The left ventricle has normal function. The left ventricle has no regional wall motion abnormalities. The left ventricular internal cavity size was normal in size. There is  no left ventricular hypertrophy. Left ventricular diastolic parameters were normal. Right Ventricle: The right ventricular size is normal. Right vetricular wall thickness was not well visualized. Right ventricular systolic function is normal. Tricuspid regurgitation signal is inadequate for assessing PA pressure. Left Atrium: Left atrial size was normal in size. Right Atrium: Right atrial size was normal in size. Pericardium: Moderate pericardial effusion overall stable from 06/18/24 study. A moderately sized pericardial effusion is present. The pericardial effusion is circumferential. There is no evidence  of cardiac tamponade. Mitral Valve: The mitral valve is normal in structure. No evidence of mitral valve regurgitation. No evidence of mitral valve stenosis. Tricuspid Valve: The tricuspid valve is normal in structure. Tricuspid valve regurgitation is not demonstrated. No evidence of tricuspid stenosis. Aortic Valve: The aortic valve is tricuspid. Aortic valve regurgitation is not visualized. No aortic stenosis is present. Pulmonic Valve: The pulmonic valve was not well visualized. Pulmonic valve regurgitation is not visualized. No evidence of pulmonic stenosis. Aorta: The aortic root is normal in size and structure. Venous: The inferior vena cava is dilated in size with greater than 50% respiratory variability, suggesting right atrial pressure of 8 mmHg. IAS/Shunts: No atrial level shunt detected by color flow Doppler.  LEFT VENTRICLE PLAX  2D LVIDd:         4.80 cm   Diastology LVIDs:         2.90 cm   LV e' medial:    7.34 cm/s LV PW:         0.95 cm   LV E/e' medial:  10.8 LV IVS:        0.90 cm   LV e' lateral:   9.02 cm/s LVOT diam:     1.60 cm   LV E/e' lateral: 8.8 LV SV:         53 LV SV Index:   35 LVOT Area:     2.01 cm  RIGHT VENTRICLE RV S prime:     13.10 cm/s TAPSE (M-mode): 2.4 cm LEFT ATRIUM             Index        RIGHT ATRIUM           Index LA diam:        2.95 cm 1.93 cm/m   RA Area:     11.80 cm LA Vol (A2C):   32.7 ml 21.44 ml/m  RA Volume:   22.60 ml  14.81 ml/m LA Vol (A4C):   37.6 ml 24.65 ml/m LA Biplane Vol: 38.3 ml 25.11 ml/m  AORTIC VALVE LVOT Vmax:   139.67 cm/s LVOT Vmean:  95.067 cm/s LVOT VTI:    0.265 m  AORTA Ao Root diam: 2.80 cm MITRAL VALVE MV Area (PHT): 3.19 cm    SHUNTS MV Decel Time: 238 msec    Systemic VTI:  0.26 m MV E velocity: 79.40 cm/s  Systemic Diam: 1.60 cm MV A velocity: 78.20 cm/s MV E/A ratio:  1.02 Dorn Ross MD Electronically signed by Dorn Ross MD Signature Date/Time: 07/02/2024/2:12:03 PM    Final    DG Chest 2 View Result Date:  07/01/2024 EXAM: 2 VIEW(S) XRAY OF THE CHEST 07/01/2024 11:01:00 PM COMPARISON: 04/12/2024 CLINICAL HISTORY: shortness of breath FINDINGS: LUNGS AND PLEURA: No focal pulmonary opacity. No pleural effusion. No pneumothorax. HEART AND MEDIASTINUM: Cardiomegaly. BONES AND SOFT TISSUES: Small irregular density within left humeral shaft, possibly bone infarct or enchondroma. No acute osseous abnormality. IMPRESSION: 1. Cardiomegaly. Electronically signed by: Elsie Gravely MD 07/01/2024 11:06 PM EST RP Workstation: [id number]     Labs:   Basic Metabolic Panel: Recent Labs  Lab 07/01/24 2251 07/02/24 0503 07/04/24 0404  NA 141 140 139  K 3.9 3.6 4.0  CL 104 104 106  CO2 30 27 24   GLUCOSE 91 90 84  BUN 11 11 14   CREATININE 0.43* 0.34* 0.53  CALCIUM 9.4 9.1 9.2  MG  --  2.0  --   PHOS  --  3.7  --    GFR Estimated Creatinine Clearance: 65.5 mL/min (by C-G formula based on SCr of 0.53 mg/dL). Liver Function Tests: No results for input(s): AST, ALT, ALKPHOS, BILITOT, PROT, ALBUMIN in the last 168 hours. No results for input(s): LIPASE, AMYLASE in the last 168 hours. No results for input(s): AMMONIA in the last 168 hours. Coagulation profile No results for input(s): INR, PROTIME in the last 168 hours.  CBC: Recent Labs  Lab 07/01/24 2251 07/02/24 0503 07/04/24 0404  WBC 5.2 5.0 4.5  HGB 12.0 11.2* 11.8*  HCT 37.3 35.4* 37.7  MCV 92.1 90.8 91.3  PLT 268 259 275   Cardiac Enzymes: No results for input(s): CKTOTAL, CKMB, CKMBINDEX, TROPONINI in the last 168 hours. BNP: Invalid input(s): POCBNP CBG: No  results for input(s): GLUCAP in the last 168 hours. D-Dimer No results for input(s): DDIMER in the last 72 hours. Hgb A1c No results for input(s): HGBA1C in the last 72 hours. Lipid Profile No results for input(s): CHOL, HDL, LDLCALC, TRIG, CHOLHDL, LDLDIRECT in the last 72 hours. Thyroid  function studies No results for  input(s): TSH, T4TOTAL, T3FREE, THYROIDAB in the last 72 hours.  Invalid input(s): FREET3 Anemia work up No results for input(s): VITAMINB12, FOLATE, FERRITIN, TIBC, IRON, RETICCTPCT in the last 72 hours. Microbiology Recent Results (from the past 240 hours)  Resp panel by RT-PCR (RSV, Flu A&B, Covid) Anterior Nasal Swab     Status: None   Collection Time: 07/01/24 11:07 PM   Specimen: Anterior Nasal Swab  Result Value Ref Range Status   SARS Coronavirus 2 by RT PCR NEGATIVE NEGATIVE Final    Comment: (NOTE) SARS-CoV-2 target nucleic acids are NOT DETECTED.  The SARS-CoV-2 RNA is generally detectable in upper respiratory specimens during the acute phase of infection. The lowest concentration of SARS-CoV-2 viral copies this assay can detect is 138 copies/mL. A negative result does not preclude SARS-Cov-2 infection and should not be used as the sole basis for treatment or other patient management decisions. A negative result may occur with  improper specimen collection/handling, submission of specimen other than nasopharyngeal swab, presence of viral mutation(s) within the areas targeted by this assay, and inadequate number of viral copies(<138 copies/mL). A negative result must be combined with clinical observations, patient history, and epidemiological information. The expected result is Negative.  Fact Sheet for Patients:  bloggercourse.com  Fact Sheet for Healthcare Providers:  seriousbroker.it  This test is no t yet approved or cleared by the [place]  FDA and  has been authorized for detection and/or diagnosis of SARS-CoV-2 by FDA under an Emergency Use Authorization (EUA). This EUA will remain  in effect (meaning this test can be used) for the duration of the COVID-19 declaration under Section 564(b)(1) of the Act, 21 U.S.C.section 360bbb-3(b)(1), unless the authorization is terminated  or revoked  sooner.       Influenza A by PCR NEGATIVE NEGATIVE Final   Influenza B by PCR NEGATIVE NEGATIVE Final    Comment: (NOTE) The Xpert Xpress SARS-CoV-2/FLU/RSV plus assay is intended as an aid in the diagnosis of influenza from Nasopharyngeal swab specimens and should not be used as a sole basis for treatment. Nasal washings and aspirates are unacceptable for Xpert Xpress SARS-CoV-2/FLU/RSV testing.  Fact Sheet for Patients: bloggercourse.com  Fact Sheet for Healthcare Providers: seriousbroker.it  This test is not yet approved or cleared by the [place]  FDA and has been authorized for detection and/or diagnosis of SARS-CoV-2 by FDA under an Emergency Use Authorization (EUA). This EUA will remain in effect (meaning this test can be used) for the duration of the COVID-19 declaration under Section 564(b)(1) of the Act, 21 U.S.C. section 360bbb-3(b)(1), unless the authorization is terminated or revoked.     Resp Syncytial Virus by PCR NEGATIVE NEGATIVE Final    Comment: (NOTE) Fact Sheet for Patients: bloggercourse.com  Fact Sheet for Healthcare Providers: seriousbroker.it  This test is not yet approved or cleared by the [place]  FDA and has been authorized for detection and/or diagnosis of SARS-CoV-2 by FDA under an Emergency Use Authorization (EUA). This EUA will remain in effect (meaning this test can be used) for the duration of the COVID-19 declaration under Section 564(b)(1) of the Act, 21 U.S.C. section 360bbb-3(b)(1), unless the authorization is terminated or revoked.  Performed at [facility], [address] [place], [place] [address]      Discharge Instructions:   Discharge Instructions     Diet - low sodium heart healthy   Complete by: As directed    Increase activity slowly   Complete by: As directed    No wound care   Complete by: As directed        Allergies as of 07/05/2024       Reactions   Penicillins Swelling   Has patient had a PCN reaction causing immediate rash, facial/tongue/throat swelling, SOB or lightheadedness with hypotension: No Has patient had a PCN reaction causing severe rash involving mucus membranes or skin necrosis: No Has patient had a PCN reaction that required hospitalization: No Has patient had a PCN reaction occurring within the last 10 years: No If all of the above answers are NO, then may proceed with Cephalosporin use.   Folic Acid  Diarrhea, Nausea And Vomiting        Medication List     STOP taking these medications    predniSONE  5 MG tablet Commonly known as: DELTASONE        TAKE these medications    ACTEMRA  IV Inject 4 mg/kg into the vein every 28 (twenty-eight) days.   amLODipine  2.5 MG tablet Commonly known as: NORVASC  TAKE 1 TABLET(2.5 MG) BY MOUTH DAILY What changed: See the new instructions.   busPIRone  5 MG tablet Commonly known as: BUSPAR  Take 1 tablet (5 mg total) by mouth 2 (two) times daily as needed. for anxiety   diphenhydrAMINE -zinc  acetate cream Commonly known as: BENADRYL  Apply topically 2 (two) times daily as needed for itching.   famotidine  20 MG tablet Commonly known as: PEPCID  Take 1 tablet by mouth daily.   methotrexate  2.5 MG tablet Commonly known as: RHEUMATREX TAKE EIGHT TABLETS BY MOUTH ONCE A WEEK What changed: See the new instructions.   ondansetron  4 MG tablet Commonly known as: ZOFRAN  Take 1 tablet (4 mg total) by mouth every 6 (six) hours.   traMADol  50 MG tablet Commonly known as: ULTRAM  Take 25 mg by mouth as needed for moderate pain (pain score 4-6).   traZODone  50 MG tablet Commonly known as: DESYREL  Take 0.5 tablets (25 mg total) by mouth at bedtime as needed for sleep.        Follow-up Information     Shona Norleen PEDLAR, MD Follow up in 1 week(s).   Specialty: Internal Medicine Contact information: [address] [place] [facility] [place] [address] [phone]         Kerrin Elspeth BROCKS, MD Follow up.   Specialty: Cardiothoracic Surgery Why: You should hear from Bernardino Sprang with Dr. Chrystal office later today or by Monday with plan for surgery/repeat echo Contact information: [address] [place] [id number] [phone]                  Time coordinating discharge: 45 min  Signed:  Harlene RAYMOND Bowl DO  [facility] 07/05/2024, 3:17 PM      "

## 2024-07-05 NOTE — Progress Notes (Signed)
 R chest tunneled catheter with bloody dressing. Dressing changed. Small amount of oozing noted after pressure held/ new dressing applied. Pt instructed to reach out to rheumatology clinic to schedule dressing changes. She voiced understanding.

## 2024-07-05 NOTE — Discharge Instructions (Addendum)
 Interventional Radiology Tunneled Central Venous Catheter Placement After Care.  Your line was placed by an interventional radiologist with [facility]. If you have questions or concerns, contact [facility] at [phone]. Please do not change the dressing yourself. It should be replaced every 2-3 weeks by facility accessing the line. Please reach out to your Rheumatologist's office to coordinate dressing changes going forward.

## 2024-07-09 ENCOUNTER — Ambulatory Visit: Admitting: Thoracic Surgery (Cardiothoracic Vascular Surgery)

## 2024-07-09 ENCOUNTER — Other Ambulatory Visit: Payer: Self-pay

## 2024-07-09 ENCOUNTER — Ambulatory Visit (HOSPITAL_BASED_OUTPATIENT_CLINIC_OR_DEPARTMENT_OTHER): Attending: Internal Medicine | Admitting: Physical Therapy

## 2024-07-09 DIAGNOSIS — M349 Systemic sclerosis, unspecified: Secondary | ICD-10-CM

## 2024-07-09 DIAGNOSIS — Z79899 Other long term (current) drug therapy: Secondary | ICD-10-CM

## 2024-07-10 ENCOUNTER — Ambulatory Visit: Admitting: Thoracic Surgery (Cardiothoracic Vascular Surgery)

## 2024-07-11 ENCOUNTER — Other Ambulatory Visit: Payer: Self-pay

## 2024-07-11 ENCOUNTER — Encounter (HOSPITAL_BASED_OUTPATIENT_CLINIC_OR_DEPARTMENT_OTHER): Admitting: Physical Therapy

## 2024-07-11 ENCOUNTER — Ambulatory Visit

## 2024-07-11 ENCOUNTER — Encounter: Attending: Internal Medicine | Admitting: Internal Medicine

## 2024-07-11 ENCOUNTER — Other Ambulatory Visit: Payer: Self-pay | Admitting: Emergency Medicine

## 2024-07-11 VITALS — BP 121/73 | HR 66 | Temp 98.6°F | Resp 14

## 2024-07-11 DIAGNOSIS — M349 Systemic sclerosis, unspecified: Secondary | ICD-10-CM | POA: Diagnosis present

## 2024-07-11 DIAGNOSIS — R112 Nausea with vomiting, unspecified: Secondary | ICD-10-CM | POA: Diagnosis present

## 2024-07-11 DIAGNOSIS — Z01810 Encounter for preprocedural cardiovascular examination: Secondary | ICD-10-CM | POA: Insufficient documentation

## 2024-07-11 DIAGNOSIS — M0600 Rheumatoid arthritis without rheumatoid factor, unspecified site: Secondary | ICD-10-CM | POA: Insufficient documentation

## 2024-07-11 DIAGNOSIS — Z111 Encounter for screening for respiratory tuberculosis: Secondary | ICD-10-CM | POA: Diagnosis present

## 2024-07-11 DIAGNOSIS — Z79899 Other long term (current) drug therapy: Secondary | ICD-10-CM | POA: Insufficient documentation

## 2024-07-11 MED ORDER — ACETAMINOPHEN 325 MG PO TABS
650.0000 mg | ORAL_TABLET | Freq: Once | ORAL | Status: AC
  Administered 2024-07-11: 650 mg via ORAL

## 2024-07-11 MED ORDER — TOCILIZUMAB 400 MG/20ML IV SOLN
4.0000 mg/kg | Freq: Once | INTRAVENOUS | Status: AC
  Administered 2024-07-11: 200 mg via INTRAVENOUS
  Filled 2024-07-11: qty 10

## 2024-07-11 MED ORDER — DIPHENHYDRAMINE HCL 25 MG PO CAPS
25.0000 mg | ORAL_CAPSULE | Freq: Once | ORAL | Status: AC
  Administered 2024-07-11: 25 mg via ORAL

## 2024-07-11 MED ORDER — HEPARIN SOD (PORK) LOCK FLUSH 100 UNIT/ML IV SOLN
500.0000 [IU] | Freq: Once | INTRAVENOUS | Status: AC | PRN
  Administered 2024-07-11: 500 [IU]

## 2024-07-11 NOTE — Progress Notes (Signed)
 Diagnosis:  Rheumatoid Arthritis  Provider:  Jeannetta Chrisopher MD  Procedure: IV Infusion  IV Type: PICC, IV Location: Right Jugular  , Actemra  (Tocilizumab ), Dose: 200 mg  Infusion Start Time: 1040  Infusion Stop Time: 1143  Post Infusion IV Care: Observation period completed, PICC Line Flushed/Capped, and PICC Line Dressing Changed  Discharge: Condition: Good, Destination: Home . AVS Declined  Performed by:  Lateisha Thurlow R, LPN

## 2024-07-12 LAB — CBC WITH DIFFERENTIAL/PLATELET
Basophils Absolute: 0 x10E3/uL (ref 0.0–0.2)
Basos: 0 %
EOS (ABSOLUTE): 0.1 x10E3/uL (ref 0.0–0.4)
Eos: 1 %
Hematocrit: 35.2 % (ref 34.0–46.6)
Hemoglobin: 11.3 g/dL (ref 11.1–15.9)
Immature Grans (Abs): 0 x10E3/uL (ref 0.0–0.1)
Immature Granulocytes: 0 %
Lymphocytes Absolute: 2 x10E3/uL (ref 0.7–3.1)
Lymphs: 31 %
MCH: 28.9 pg (ref 26.6–33.0)
MCHC: 32.1 g/dL (ref 31.5–35.7)
MCV: 90 fL (ref 79–97)
Monocytes Absolute: 0.6 x10E3/uL (ref 0.1–0.9)
Monocytes: 10 %
Neutrophils Absolute: 3.8 x10E3/uL (ref 1.4–7.0)
Neutrophils: 58 %
Platelets: 362 x10E3/uL (ref 150–450)
RBC: 3.91 x10E6/uL (ref 3.77–5.28)
RDW: 14.5 % (ref 11.7–15.4)
WBC: 6.5 x10E3/uL (ref 3.4–10.8)

## 2024-07-12 LAB — LIPID PANEL
Chol/HDL Ratio: 3.2 ratio (ref 0.0–4.4)
Cholesterol, Total: 161 mg/dL (ref 100–199)
HDL: 51 mg/dL
LDL Chol Calc (NIH): 96 mg/dL (ref 0–99)
Triglycerides: 74 mg/dL (ref 0–149)
VLDL Cholesterol Cal: 14 mg/dL (ref 5–40)

## 2024-07-12 LAB — COMPREHENSIVE METABOLIC PANEL WITH GFR
ALT: 31 IU/L (ref 0–32)
AST: 30 IU/L (ref 0–40)
Albumin: 3.9 g/dL (ref 3.8–4.9)
Alkaline Phosphatase: 63 IU/L (ref 49–135)
BUN/Creatinine Ratio: 30 — ABNORMAL HIGH (ref 9–23)
BUN: 13 mg/dL (ref 6–24)
Bilirubin Total: 0.4 mg/dL (ref 0.0–1.2)
CO2: 21 mmol/L (ref 20–29)
Calcium: 9.2 mg/dL (ref 8.7–10.2)
Chloride: 104 mmol/L (ref 96–106)
Creatinine, Ser: 0.43 mg/dL — ABNORMAL LOW (ref 0.57–1.00)
Globulin, Total: 3.2 g/dL (ref 1.5–4.5)
Glucose: 84 mg/dL (ref 70–99)
Potassium: 4.1 mmol/L (ref 3.5–5.2)
Sodium: 141 mmol/L (ref 134–144)
Total Protein: 7.1 g/dL (ref 6.0–8.5)
eGFR: 118 mL/min/1.73

## 2024-07-15 ENCOUNTER — Encounter: Admitting: *Deleted

## 2024-07-15 VITALS — BP 120/85 | HR 76 | Temp 98.0°F | Resp 16

## 2024-07-15 DIAGNOSIS — M349 Systemic sclerosis, unspecified: Secondary | ICD-10-CM

## 2024-07-15 DIAGNOSIS — M0600 Rheumatoid arthritis without rheumatoid factor, unspecified site: Secondary | ICD-10-CM

## 2024-07-15 DIAGNOSIS — R112 Nausea with vomiting, unspecified: Secondary | ICD-10-CM

## 2024-07-15 DIAGNOSIS — Z79899 Other long term (current) drug therapy: Secondary | ICD-10-CM

## 2024-07-15 MED ORDER — SODIUM CHLORIDE 0.9% FLUSH
10.0000 mL | Freq: Once | INTRAVENOUS | Status: AC
  Administered 2024-07-15: 10 mL via INTRAVENOUS

## 2024-07-15 MED ORDER — HEPARIN SOD (PORK) LOCK FLUSH 100 UNIT/ML IV SOLN
250.0000 [IU] | Freq: Once | INTRAVENOUS | Status: AC | PRN
  Administered 2024-07-15: 250 [IU]

## 2024-07-15 NOTE — Progress Notes (Unsigned)
 " Cardiology Office Note:  .   Date:  07/16/2024  ID:  Erin Higgins, DOB [birth year], MRN [id number] PCP: Shona Norleen PEDLAR, MD   HeartCare Providers Cardiologist:  Diannah SHAUNNA Maywood, MD {   History of Present Illness: .   Erin Higgins is a 54 y.o. female  with PMHx of systemic sclerosis/scleroderma, seronegative rheumatoid arthritis, history of pericardial effusion, HTN, GERD, anxiety who reports to [facility] office for hospital follow up.   Pertinent cardiac medical history:  Pericardial effusion s/p pericardiocentesis with 250 cc of straw-colored fluid drained in 02/2024.  Previously not started on colchicine due to interaction with tocilizumab /methotrexate .  RHC 02/2024: Mild pulmonary hypertension with mildly elevated PCW suggestive of group 2 PAH Echo 06/2024: EF 60 to 65%, moderate pericardial effusion (stable from 03/2024 -05/2024), no evidence of tamponade. Cardiac MRI 06/2024: Peak LVEF 47%, RVEF 44%, moderate circumferential pericardial effusion with apical predominance, no LGE.   Last seen by heartcare 1/7-02/2025 during consult for pericardial effusion evaluation for consideration of pericardiocentesis versus pericardial window.  Reported worsening SOB and intermittent palpitations that was worse with exertion.  Echo and cardiac MRI in 06/2024 as above with concern for moderate effusion and no evidence of tamponade.  Scheduled to follow-up with CT surgery for pericardial window, however per chart review multiple appointments were canceled and she is rescheduled for 2/3.  Plan was to start GDMT in outpatient after procedure.  Restarted amlodipine .  Discharged on amlodipine  2.5 mg daily.   Today, she reports that shortness of breath has resolved. She notes only intermittent palpitations associated with coffee intake or extreme excitement. She remains undecided between pericardiocentesis versus pericardial window due to other scleroderma patient having issues with surgical  windows. She denies chest pain, shortness of breath, syncope, presyncope, dizziness, orthopnea, PND, edema, significant weight changes, bleeding, or claudication. She reports medication compliance and adherence to a low-sodium diet. Fine motor activities are limited due to scleroderma; however, she is able to ambulate through the grocery store using a cart and climb stairs without limitation. She denies tobacco, alcohol, or illicit drug use and denies recent hospitalizations or ED visits.  ROS: 10 point review of system has been reviewed and considered negative except ones been listed in the HPI.   Studies Reviewed: .   Right heart catheterization and pericardiocentesis 03/19/2024: RA 10/8, mean 7 mmHg RV 38/3, EDP 9 mmHg PA 39/13, mean 25 mmHg.  PA saturation 76%. PW 15/23, mean 16 mmHg.  Aortic saturation was 100%. QP/QS 1.00.  CO 6.31, CI 4.07 by Fick.  PAPi 3.7.   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   Pericardiocentesis: 250 cc of straw-colored fluid aspirated without any complication.  Sent to the lab for evaluation.  ECHO 06/2024 IMPRESSIONS   1. Left ventricular ejection fraction, by estimation, is 60 to 65%. The  left ventricle has normal function. The left ventricle has no regional  wall motion abnormalities. Left ventricular diastolic parameters were  normal.   2. Right ventricular systolic function is normal. The right ventricular  size is normal. Tricuspid regurgitation signal is inadequate for assessing  PA pressure.   3. Moderate pericardial effusion overall stable from 06/18/24 study..  Moderate pericardial effusion. The pericardial effusion is  circumferential. There is no evidence of cardiac tamponade.   4. The mitral valve is normal in structure. No evidence of mitral valve  regurgitation. No evidence of mitral stenosis.   5. The aortic valve  is tricuspid. Aortic valve regurgitation is not   visualized. No aortic stenosis is present.   6. The inferior vena cava is dilated in size with >50% respiratory  variability, suggesting right atrial pressure of 8 mmHg.   Cardiac MRI 06/2024 IMPRESSION: 1. Mild decrease in left ventricular systolic function (LVEF =47%). Evidence of edema visually on T2 TSE sequence.   2. Mild decrease in right ventricular systolic function (RVEF =44%).   3. Moderate, circumferential pericardial effusion with apical predominance.  CV Studies: Cardiac studies reviewed are outlined and summarized above. Otherwise please see EMR for full report.   Physical Exam:   VS:  BP 116/72   Pulse 84   Ht 5' 4 (1.626 m)   Wt 113 lb 9.6 oz (51.5 kg)   SpO2 97%   BMI 19.50 kg/m    Wt Readings from Last 3 Encounters:  07/16/24 113 lb 9.6 oz (51.5 kg)  07/01/24 110 lb (49.9 kg)  06/17/24 110 lb (49.9 kg)    GEN: Well nourished, well developed in no acute distress while sitting in chair.  NECK: No JVD; No carotid bruits CARDIAC: RRR, no murmurs, rubs, gallops RESPIRATORY:  Clear to auscultation without rales, wheezing or rhonchi  ABDOMEN: Soft, non-tender, non-distended EXTREMITIES:  No edema; No deformity   ASSESSMENT AND PLAN: .   Pericardial Effusion - History of pericardiocentesis with recurrent moderate effusion; currently stable without tamponade. - Pending CT surgery evaluation for pericardial window vs repeat pericardiocentesis. - Continue amlodipine  2.5 mg daily; follow up with CT surgery as scheduled on 2/3; defer colchicine due to immunosuppressive therapy interactions.  HFmrEF  - Echo with preserved EF; CMR with mildly reduced biventricular function. - Start empagliflozin 10 mg daily; hold 72 hours prior to pericardial window procedure; monitor BP and contact office if SBP <100 mmHg or if lightheadedness/dizziness develops.  Pulmonary Hypertension - Group 2 - Mild PH with elevated PCWP on prior RHC. - Continue volume and BP management with  amlodipine  2.5 mg daily  Palpitations - Intermittent, caffeine- and stress-related; currently mild. - No medication changes at this time; avoid known triggers and monitor symptoms.  Hypertension - Controlled on current regimen. - Continue amlodipine  2.5 mg daily; continue low-sodium diet and home BP monitoring. - If BP too low with Jardiance then would consider d/c amlodipine .     Dispo: Follow up in 3 months   Signed, Lorette CINDERELLA Kapur, PA-C  "

## 2024-07-15 NOTE — Progress Notes (Addendum)
 Diagnosis: PICC Line in Place  Provider: Daved Holstein, PA  Procedure:   Line Flushed  Discharge: Condition: Good, Destination: Home . AVS provided to patient.   Performed by:  Baldwin Darice Helling, RN

## 2024-07-16 ENCOUNTER — Ambulatory Visit: Attending: Physician Assistant | Admitting: Physician Assistant

## 2024-07-16 ENCOUNTER — Encounter: Payer: Self-pay | Admitting: Physician Assistant

## 2024-07-16 VITALS — BP 116/72 | HR 84 | Ht 64.0 in | Wt 113.6 lb

## 2024-07-16 DIAGNOSIS — I272 Pulmonary hypertension, unspecified: Secondary | ICD-10-CM | POA: Diagnosis not present

## 2024-07-16 DIAGNOSIS — M349 Systemic sclerosis, unspecified: Secondary | ICD-10-CM

## 2024-07-16 DIAGNOSIS — I5020 Unspecified systolic (congestive) heart failure: Secondary | ICD-10-CM | POA: Insufficient documentation

## 2024-07-16 DIAGNOSIS — R002 Palpitations: Secondary | ICD-10-CM | POA: Diagnosis not present

## 2024-07-16 DIAGNOSIS — I10 Essential (primary) hypertension: Secondary | ICD-10-CM | POA: Diagnosis not present

## 2024-07-16 DIAGNOSIS — I3139 Other pericardial effusion (noninflammatory): Secondary | ICD-10-CM | POA: Insufficient documentation

## 2024-07-16 MED ORDER — DAPAGLIFLOZIN PROPANEDIOL 10 MG PO TABS: 10.0000 mg | ORAL_TABLET | Freq: Every day | ORAL | 11 refills | Status: AC

## 2024-07-16 NOTE — Patient Instructions (Addendum)
 Medication Instructions:  Your physician has recommended you make the following change in your medication:   -Start Farxiga  10 mg once daily. HOLD 72 HOURS PRIOR TO PERICARDIAL WINDOW PROCEDURE.   *If you need a refill on your cardiac medications before your next appointment, please call your pharmacy*  Lab Work: None If you have labs (blood work) drawn today and your tests are completely normal, you will receive your results only by: MyChart Message (if you have MyChart) OR A paper copy in the mail If you have any lab test that is abnormal or we need to change your treatment, we will call you to review the results.  Testing/Procedures: None  Follow-Up: At [facility], you and your health needs are our priority.  As part of our continuing mission to provide you with exceptional heart care, our providers are all part of one team.  This team includes your primary Cardiologist (physician) and Advanced Practice Providers or APPs (Physician Assistants and Nurse Practitioners) who all work together to provide you with the care you need, when you need it.  Your next appointment:   3 month(s)  Provider:   You may see Vishnu P Mallipeddi, MD or one of the following Advanced Practice Providers on your designated Care Team:   Brittany Strader, PA-C  Scotesia Bolan, [place] Olivia Pavy, [place]     We recommend signing up for the patient portal called MyChart.  Sign up information is provided on this After Visit Summary.  MyChart is used to connect with patients for Virtual Visits (Telemedicine).  Patients are able to view lab/test results, encounter notes, upcoming appointments, etc.  Non-urgent messages can be sent to your provider as well.   To learn more about what you can do with MyChart, go to [email or web address].   Other Instructions Monitor your blood pressure. Please call our office is your top number is  <100 or you are symptomatic.

## 2024-07-18 ENCOUNTER — Ambulatory Visit

## 2024-07-18 VITALS — BP 147/94 | HR 82 | Temp 98.0°F | Resp 15

## 2024-07-18 DIAGNOSIS — M0600 Rheumatoid arthritis without rheumatoid factor, unspecified site: Secondary | ICD-10-CM | POA: Diagnosis not present

## 2024-07-18 DIAGNOSIS — R112 Nausea with vomiting, unspecified: Secondary | ICD-10-CM

## 2024-07-18 DIAGNOSIS — M349 Systemic sclerosis, unspecified: Secondary | ICD-10-CM

## 2024-07-18 DIAGNOSIS — Z79899 Other long term (current) drug therapy: Secondary | ICD-10-CM

## 2024-07-18 MED ORDER — HEPARIN SOD (PORK) LOCK FLUSH 100 UNIT/ML IV SOLN
250.0000 [IU] | Freq: Once | INTRAVENOUS | Status: AC | PRN
  Administered 2024-07-18: 250 [IU]

## 2024-07-18 MED ORDER — SODIUM CHLORIDE 0.9% FLUSH
10.0000 mL | Freq: Once | INTRAVENOUS | Status: AC
  Administered 2024-07-18: 10 mL via INTRAVENOUS

## 2024-07-18 NOTE — Progress Notes (Signed)
 Diagnosis:  PICC Line in Place  Provider:  Lonni Ester MD  Procedure: PICC Dressing Change  Line Flushed and Dressing Changed  Discharge: Condition: Good, Destination: Home . AVS provided to patient.   Performed by:  Delon ONEIDA Officer, RN

## 2024-07-23 ENCOUNTER — Encounter: Admitting: Emergency Medicine

## 2024-07-23 VITALS — BP 136/85 | HR 85 | Temp 97.7°F | Resp 16

## 2024-07-23 DIAGNOSIS — Z79899 Other long term (current) drug therapy: Secondary | ICD-10-CM

## 2024-07-23 DIAGNOSIS — M0600 Rheumatoid arthritis without rheumatoid factor, unspecified site: Secondary | ICD-10-CM

## 2024-07-23 DIAGNOSIS — R112 Nausea with vomiting, unspecified: Secondary | ICD-10-CM

## 2024-07-23 DIAGNOSIS — M349 Systemic sclerosis, unspecified: Secondary | ICD-10-CM

## 2024-07-23 MED ORDER — SODIUM CHLORIDE 0.9% FLUSH
10.0000 mL | Freq: Once | INTRAVENOUS | Status: AC
  Administered 2024-07-23: 10 mL via INTRAVENOUS

## 2024-07-23 MED ORDER — HEPARIN SOD (PORK) LOCK FLUSH 100 UNIT/ML IV SOLN
250.0000 [IU] | Freq: Once | INTRAVENOUS | Status: AC | PRN
  Administered 2024-07-23: 250 [IU]

## 2024-07-23 NOTE — Progress Notes (Signed)
 Diagnosis: PICC Line in Place  Provider:  Lonni Ester MD  Procedure: PICC Line Flush, right chest  Line Flushed, Blood return noted  Discharge: Condition: Good, Destination: Home . AVS provided to patient.   Performed by:  Delon ONEIDA Officer, RN

## 2024-07-25 ENCOUNTER — Other Ambulatory Visit: Payer: Self-pay | Admitting: Internal Medicine

## 2024-07-25 ENCOUNTER — Encounter: Admitting: Emergency Medicine

## 2024-07-25 ENCOUNTER — Ambulatory Visit

## 2024-07-25 DIAGNOSIS — M0600 Rheumatoid arthritis without rheumatoid factor, unspecified site: Secondary | ICD-10-CM | POA: Diagnosis not present

## 2024-07-25 DIAGNOSIS — M349 Systemic sclerosis, unspecified: Secondary | ICD-10-CM

## 2024-07-25 NOTE — Progress Notes (Signed)
 Diagnosis: PICC Line in Place  Provider:  Lonni Ester MD  Procedure: PICC Dressing Change  Line Flushed and Dressing Changed  Discharge: Condition: Good, Destination: Home . AVS provided to patient.   Performed by:  Delon ONEIDA Officer, RN

## 2024-07-25 NOTE — Telephone Encounter (Signed)
 Last Fill: 04/23/2024  Labs: 07/11/2024 Creat. 0.43, BUN/Creat. Ratio 30  Next Visit: not on file  Last Visit: 04/12/2024  DX: Systemic sclerosis   Current Dose per office note 04/12/2024: not discussed  Okay to refill Methotrexate ? When should patient follow up?

## 2024-07-25 NOTE — Progress Notes (Signed)
 [facility]    PCP: No primary care provider on file.  Visit date: 07/25/2024   Referring Provider: Jorizzo, Joseph, MD   Reason for visit: Scleroderma    HPI Erin Higgins is a 54 y.o. female with past medical history of systemic sclerosis/scleroderma, pericardial effusion s/p pericardiocentesis (02/2024), seronegative RA, HTN, GERD and anxiety who presents to general cardiology clinic to establish care. She was referred to our team by her rheumatologist.   Historically: She was admitted in September of 2025 to [facility] for palpitations. TTE showed pericardial effusion with evidence of pericardial tamponade.  Underwent right heart catheterization and pericardiocentesis with 250 cc fluid removal on 03/19/2024.  She was admitted at [facility] in early January 2026 with shortness of breath, palpitations. TTE showed moderate circumferential pericardial effusion with no evidence of tamponade. Underwent cardiac MRI on 07/04/2024 that showed normal LV size, LVEF 47%, no WMA, no LGE in LV, mild decrease in RV function. Notably, moderate, circumferential pericardial effusion with apical predominance. No intervention was completed.  Seen in hospital follow-up with [facility] cardiology on 07/16/2024. Prescribed Farxiga .   Today, Rayanne presents with her mother, Bascom, and nephew, Royal, feeling overall well from a CV standpoint. She denies any prior cardiac disease or CV-related hospitalizations prior to recent events, as above.   Has not started Farxiga ; waiting on insurance authorization. Denies any chest pain or dyspnea. Prior to hospitalization earlier this month, had a busy weekend and felt winded with rushing around. No orthopnea or recent DOE.   She can have palpitations if she gets too excited or if she drinks strong coffee.   She is scheduled to see cardiothoracic surgical team at [facility] on 07/30/2024 for consideration of pericardial window or  pericardiocentesis. She prefers pericardiocentesis. Has fears regarding   Best effort is walking around the mall shopping.    Has lightheadedness sometimes that she thinks is secondary to increased need for anxiety pill.  She has had no near-syncope, syncope or falls.  She has two children, aged 43 and 37.   CV Metrics: CV related hospital admission or ED visits since last OV: As above BP at home: Does not check at home Weights:  Wt Readings from Last 3 Encounters:  07/25/24 51.1 kg (112 lb 11.2 oz)  07/24/24 50.8 kg (112 lb)  07/25/23 52.6 kg (116 lb)  Exercise: As above Caffeine: 1 cup 3 times weekly Tobacco: None Etoh: None Illicit drug use: None  Pertinent family history:  - Father with no known heart disease.  - Mother with chemo-induced HF, resolved with medication. - PGM had atrial fibrillation, passed at age 91 from cancer.  - MGF passed away from enlarged heart at age 16. - Children with no known heart disease.   Medical History[1]   Surgical History[2]   Social History   Socioeconomic History   Marital status: Single    Spouse name: Not on file   Number of children: Not on file   Years of education: Not on file   Highest education level: Not on file  Occupational History   Not on file  Tobacco Use   Smoking status: Never   Smokeless tobacco: Never  Vaping Use   Vaping status: Never Used  Substance and Sexual Activity   Alcohol use: Not Currently   Drug use: Not on file   Sexual activity: Not on file  Other Topics Concern   Not on file  Social History Narrative  Not on file   Social Drivers of Health   Living Situation: High Risk (07/02/2024)   Received from [facility] Situation    In the last 12 months, was there a time when you were not able to pay the mortgage or rent on time?: Yes    In the past 12 months, how many times have you moved where you were living?: 0    At any time in the past 12 months, were you homeless  or living in a shelter (including now)?: No  Food Insecurity: No Food Insecurity (07/02/2024)   Received from [facility]   Food vital sign    Within the past 12 months, you worried that your food would run out before you got money to buy more: Never true    Within the past 12 months, the food you bought just didn't last and you didn't have money to get more: Never true  Transportation Needs: No Transportation Needs (07/02/2024)   Received from [facility] - Transportation    In the past 12 months, has lack of transportation kept you from medical appointments or from getting medications?: No    In the past 12 months, has lack of transportation kept you from meetings, work, or from getting things needed for daily living?: No  Utilities: Not At Risk (07/02/2024)   Received from [facility]   Utilities    In the past 12 months has the electric, gas, oil, or water company threatened to shut off services in your home?: No  Safety: Not At Risk (07/02/2024)   Received from [facility]   Safety    Within the last year, have you been afraid of your partner or ex-partner?: No    Within the last year, have you been humiliated or emotionally abused in other ways by your partner or ex-partner?: No    Within the last year, have you been kicked, hit, slapped, or otherwise physically hurt by your partner or ex-partner?: No    Within the last year, have you been raped or forced to have any kind of sexual activity by your partner or ex-partner?: No  Alcohol Screening: Not on file  Tobacco Use: Low Risk (07/24/2024)   Patient History    Smoking Tobacco Use: Never    Smokeless Tobacco Use: Never    Passive Exposure: Not on file  Depression: Not At Risk (07/25/2024)   PHQ-2    PHQ-2 Score: 0  Social Connections: Unknown (03/16/2024)   Received from [facility]   Social Connection and Isolation Panel    In a typical week, how many times do you talk on the phone with family, friends, or  neighbors?: Three times a week    How often do you get together with friends or relatives?: Three times a week    How often do you attend church or religious services?: Never    Do you belong to any clubs or organizations such as church groups, unions, fraternal or athletic groups, or school groups?: No    How often do you attend meetings of the clubs or organizations you belong to?: Never    Marital Status: Not on file  Financial Resource Strain: Not on file     Family History[3]   Allergies Penicillins   Current Outpatient Medications  Medication Instructions   amLODIPine  (NORVASC ) 2.5 mg, Daily   buspirone  (BUSPAR ) 5 mg, 2 times daily PRN   famotidine  (PEPCID ) 20 mg tablet Take one  tablet by mouth once daily.   methotrexate  2.5 mg tablet TAKE EIGHT TABLETS BY MOUTH ONCE A WEEK   ondansetron  (ZOFRAN ) 4 mg, Every 6 hours PRN   tocilizumab  (ACTEMRA ) chemo IVPB 4 mg/kg, Every 30 days   traMADoL  (ULTRAM ) 50 mg tablet Every 12 hours PRN   traZODone  (DESYREL ) 50 mg tablet Daily    ROS As detailed in HPI.  Physical Exam BP 145/89 (BP Location: Left arm, Patient Position: Sitting)   Pulse 76   Wt 51.1 kg (112 lb 11.2 oz)   SpO2 90%   BMI 18.75 kg/m   GENERAL: The patient appears well and is in no distress. Body habitus is normal. NECK: Jugular venous distention: none (< 8 cm). CHEST: The lungs are clear to auscultation. The respiratory effort is normal.  HEART: The pulse is regular. Heart sounds: normal S1/S2. No S3/S4. No gallops are present. No rubs are present. Murmurs: None.   VASCULAR: Carotid upstrokes are normal and no bruit noted. Peripheral pulses are palpable. ABDOMEN: Abdomen is soft, no tenderness noted. EXTREMITIES: No phlebitis; no clubbing or cyanosis. Edema is not present.  SKIN: There is no pallor, jaundice, rashes, ulcers or xanthoma and the skin is warm and dry. HEMATOLOGIC/LYMPHATIC: No petechiae or bruising  NEUROLOGIC: Patient is alert and  oriented; no gross focal deficits are noted. PSYCHIATRIC: Mood and affect are appropriate.    Diagnostics EKG today: personally reviewed Normal sinus rhythm LAFB Nonspecific T wave abnormality Prolonged QTI  TTE 07/02/2024: IMPRESSIONS    1. Left ventricular ejection fraction, by estimation, is 60 to 65%. The left ventricle has normal function. The left ventricle has no regional wall motion abnormalities. Left ventricular diastolic parameters were normal.   2. Right ventricular systolic function is normal. The right ventricular size is normal. Tricuspid regurgitation signal is inadequate for assessing PA pressure.   3. Moderate pericardial effusion overall stable from 06/18/24 study.. Moderate pericardial effusion. The pericardial effusion is circumferential. There is no evidence of cardiac tamponade.   4. The mitral valve is normal in structure. No evidence of mitral valve regurgitation. No evidence of mitral stenosis.   5. The aortic valve is tricuspid. Aortic valve regurgitation is not visualized. No aortic stenosis is present.   6. The inferior vena cava is dilated in size with >50% respiratory variability, suggesting right atrial pressure of 8 mmHg.   FINDINGS   Left Ventricle: Left ventricular ejection fraction, by estimation, is 60 to 65%. The left ventricle has normal function. The left ventricle has no regional wall motion abnormalities. The left ventricular internal cavity size was normal in size. There is   no left ventricular hypertrophy. Left ventricular diastolic parameters were normal.   Right Ventricle: The right ventricular size is normal. Right vetricular wall thickness was not well visualized. Right ventricular systolic function is normal. Tricuspid regurgitation signal is inadequate for assessing PA pressure.   Left Atrium: Left atrial size was normal in size.   Right Atrium: Right atrial size was normal in size.   Pericardium: Moderate pericardial effusion overall  stable from 06/18/24 study. A moderately sized pericardial effusion is present. The pericardial effusion is circumferential. There is no evidence of cardiac tamponade.   Mitral Valve: The mitral valve is normal in structure. No evidence of mitral valve regurgitation. No evidence of mitral valve stenosis.   Tricuspid Valve: The tricuspid valve is normal in structure. Tricuspid valve regurgitation is not demonstrated. No evidence of tricuspid stenosis.   Aortic Valve: The aortic valve is tricuspid.  Aortic valve regurgitation is not visualized. No aortic stenosis is present.   Pulmonic Valve: The pulmonic valve was not well visualized. Pulmonic valve regurgitation is not visualized. No evidence of pulmonic stenosis.   Aorta: The aortic root is normal in size and structure.   Venous: The inferior vena cava is dilated in size with greater than 50% respiratory variability, suggesting right atrial pressure of 8 mmHg.   IAS/Shunts: No atrial level shunt detected by color flow Doppler.   RHC, Pericardiocentesis 03/19/2024: Right heart catheterization and pericardiocentesis 03/19/2024:  RA 10/8, mean 7 mmHg  RV 38/3, EDP 9 mmHg  PA 39/13, mean 25 mmHg.  PA saturation 76%.  PW 15/23, mean 16 mmHg.  Aortic saturation was 100%.  QP/QS 1.00.  CO 6.31, CI 4.07 by Fick.  PAPi 3.7.   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   Pericardiocentesis: 250 cc of straw-colored fluid aspirated without any  complication.  Sent to the lab for evaluation.   Cardiac MRI WWO Contrast ([facility]) 07/04/2024: FINDINGS:  1. Normal left ventricular size, with LVEDD 44 mm, and LVEDVi 69  mL/m2.   Normal left ventricular thickness.   Mild decrease in left ventricular systolic function (LVEF =47%).  There are no regional wall motion abnormalities but global  hypokinesis.   Left ventricular parametric mapping notable for elevated T2  qualitatively but 54  ms (normal) on T2 mapping. ECV elevated 40% in  the inferior apex.   There is no late gadolinium enhancement in the left ventricular  myocardium.   Normal resting perfusion.   2. Normal right ventricular size with RVEDVI 74 mL/m2.   Normal right ventricular thickness.   Mild decrease in right ventricular systolic function (RVEF =44%).  There are no regional wall motion abnormalities or aneurysms. There  is no RV collapse.   3.  Normal left and right atrial size.   4. Normal size of the aortic root, ascending aorta and pulmonary  artery.   5. Valve assessment:   Aortic Valve: Tri-leaflet aortic valve. Qualitatively, there is no  significant regurgitation. Regurgitant fraction < 1%.   Pulmonic Valve: Qualitatively, there is no significant  regurgitation. Regurgitant fraction < 1%.   Tricuspid Valve: Tri-leaflet valve. Qualitatively, there is no  significant regurgitation. Regurgitant fraction 6%.   Mitral Valve: Qualitatively, there is no significant regurgitation.  Regurgitant fraction 1%.   6. Normal pericardium without pericardial thickening. There is no  pericardial hyper-enhancement.   There is no IVC plethora, abnormal interventricular wall motion, or  collapse of the right atrium or ventricle. There is a moderate  pericardial effusion. Though the effusion is circumferential; there  is an apical predominance.   Apical epicardial fat noted.   7. Grossly, no extracardiac findings. Small (3 mm) subpleural right  middle lung nodule. Recommended dedicated study if concerned for  non-cardiac pathology.   8.  Free breathing artifacts noted.   IMPRESSION:  1. Mild decrease in left ventricular systolic function (LVEF =47%).  Evidence of edema visually on T2 TSE sequence.   2. Mild decrease in right ventricular systolic function (RVEF =44%).   3. Moderate, circumferential pericardial effusion with apical  predominance.   The ASCVD Risk score (Arnett DK, et al.,  2019) failed to calculate for the following reasons:   Cannot find a previous HDL lab   Cannot find a previous total cholesterol lab   * - Cholesterol units were assumed   Assessment/Plan: Erin Higgins  is a 54 y.o. 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  # Pericardial effusion: History of pericardiocentesis 02/2024 with recurrence 06/2024. Cytology w/o malignant cells or infection. Previously completed steroid taper. Denies anginal or HF symptoms.  - CMR and TTE showed moderate, circumferential pericardial effusion.  - Followed by CTS team at [facility]; discussion for pericardial window vs repeat pericardiocentesis. Recommended patient keep appointment.  - Continue rheumatology follow-up.  - Would avoid routine steroid use if able.   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 Euvolemic on exam, warm and well perfused without evidence of decompensated HF. Hypertensive at time of visit.   GDMT: None - Will start Farxiga  10 mg daily once approved by insurance.  - Encouraged patient to purchase a blood pressure cuff. Check blood pressure and heart rate daily for 2 weeks, keep log and report findings. Ideally, will initiate ARB pending blood pressure response. Consider discontinuation of CCB, if needed.  - Consider repeat CMR once on optimized GDMT.   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  - Euvolemic on exam, warm and well perfused.   # HTN: As above  # Prolonged QT: on EKG today.  - Question if medication-induced. Recommend reduction in use of ondansetron  and trazodone .   # Scleroderma: Follows with rheumatology.   Encouraged heart healthy lifestyle including diet, routine exercise > 150 minutes weekly and weight management.    I've asked the pt to call our office should he/she experience significant CP, SOB, swelling, dizziness, or syncope.    Discussion centered around patient diagnosis, treatment, and management options.   The above discussed in length with patient, all questions answered. If any changes/questions/concerns patient will call.    Thank you for the opportunity to participate in the care of this patient.  Josette Hush, NP-C General Cardiology   Follow-up: 3 mo or sooner, if needed with Dr. Nonah  Scheduled Future Appointments       Provider Department Dept Phone Center   10/24/2024 11:00 AM Joseph Jorizzo [facility] [place] - Dermatology [phone] [facility] CC WS   10/25/2024 2:00 PM Kristen Koberstein Singleton [facility] - Cardiology [place] [phone] [facility] CC WS   03/19/2025 3:00 PM RANGEL/AGGARWAL [facility] - Cardiology [place] [phone] [facility] CC WS               [1] No past medical history on file. [2] No past surgical history on file. [3] No family history on file.

## 2024-07-26 ENCOUNTER — Ambulatory Visit: Payer: Self-pay | Admitting: Internal Medicine

## 2024-07-26 NOTE — Telephone Encounter (Signed)
 Recommend scheduling follow up any time next month. Last visit in October on methotrexate .

## 2024-07-26 NOTE — Telephone Encounter (Signed)
 Message sent to the front desk to schedule.

## 2024-07-30 ENCOUNTER — Encounter: Payer: Self-pay | Admitting: Thoracic Surgery (Cardiothoracic Vascular Surgery)

## 2024-07-30 ENCOUNTER — Encounter: Attending: Internal Medicine | Admitting: Internal Medicine

## 2024-07-30 ENCOUNTER — Ambulatory Visit: Admitting: Thoracic Surgery (Cardiothoracic Vascular Surgery)

## 2024-07-30 VITALS — BP 126/88 | HR 88 | Temp 98.0°F | Resp 16

## 2024-07-30 VITALS — BP 136/92 | HR 90 | Resp 18 | Ht 64.0 in | Wt 112.0 lb

## 2024-07-30 DIAGNOSIS — M349 Systemic sclerosis, unspecified: Secondary | ICD-10-CM

## 2024-07-30 DIAGNOSIS — I3139 Other pericardial effusion (noninflammatory): Secondary | ICD-10-CM | POA: Diagnosis not present

## 2024-07-30 DIAGNOSIS — Z79899 Other long term (current) drug therapy: Secondary | ICD-10-CM

## 2024-07-30 DIAGNOSIS — M0600 Rheumatoid arthritis without rheumatoid factor, unspecified site: Secondary | ICD-10-CM

## 2024-07-30 DIAGNOSIS — R112 Nausea with vomiting, unspecified: Secondary | ICD-10-CM

## 2024-07-30 MED ORDER — HEPARIN SOD (PORK) LOCK FLUSH 100 UNIT/ML IV SOLN
250.0000 [IU] | Freq: Once | INTRAVENOUS | Status: AC | PRN
  Administered 2024-07-30: 250 [IU]

## 2024-07-30 MED ORDER — SODIUM CHLORIDE 0.9% FLUSH
10.0000 mL | Freq: Once | INTRAVENOUS | Status: AC
  Administered 2024-07-30: 10 mL via INTRAVENOUS

## 2024-07-30 NOTE — Progress Notes (Signed)
 Diagnosis:  PICC Line in Place  Provider:  Ana GORMAN Holstein      Procedure: PICC Dressing Change  Line Flushed  Discharge: Condition: Good, Destination: Home . AVS provided to patient.   Performed by:  Blanca Selinda SAUNDERS, LPN

## 2024-08-01 ENCOUNTER — Ambulatory Visit

## 2024-08-01 VITALS — BP 136/97 | HR 92 | Temp 97.8°F | Resp 16

## 2024-08-01 DIAGNOSIS — M349 Systemic sclerosis, unspecified: Secondary | ICD-10-CM

## 2024-08-01 DIAGNOSIS — R112 Nausea with vomiting, unspecified: Secondary | ICD-10-CM

## 2024-08-01 DIAGNOSIS — M0600 Rheumatoid arthritis without rheumatoid factor, unspecified site: Secondary | ICD-10-CM

## 2024-08-01 DIAGNOSIS — Z79899 Other long term (current) drug therapy: Secondary | ICD-10-CM

## 2024-08-01 MED ORDER — HEPARIN SOD (PORK) LOCK FLUSH 100 UNIT/ML IV SOLN
250.0000 [IU] | Freq: Once | INTRAVENOUS | Status: AC | PRN
  Administered 2024-08-01: 250 [IU]

## 2024-08-01 MED ORDER — SODIUM CHLORIDE 0.9% FLUSH
10.0000 mL | Freq: Once | INTRAVENOUS | Status: AC
  Administered 2024-08-01: 10 mL via INTRAVENOUS

## 2024-08-01 NOTE — Progress Notes (Signed)
 Diagnosis: PICC Line in Place  Provider:  Lonni Ester, MD  Procedure: PICC Dressing Change  Line Flushed and Dressing Changed  Discharge: Condition: Good, Destination: Home . AVS provided to patient.   Performed by:  Delon ONEIDA Officer, RN

## 2024-08-02 ENCOUNTER — Encounter: Payer: Self-pay | Admitting: Internal Medicine

## 2024-08-02 ENCOUNTER — Encounter: Admitting: Internal Medicine

## 2024-08-02 VITALS — BP 147/74 | HR 90 | Temp 98.3°F | Resp 16 | Ht 64.0 in | Wt 111.8 lb

## 2024-08-02 DIAGNOSIS — I3139 Other pericardial effusion (noninflammatory): Secondary | ICD-10-CM

## 2024-08-02 DIAGNOSIS — I7300 Raynaud's syndrome without gangrene: Secondary | ICD-10-CM

## 2024-08-02 DIAGNOSIS — M349 Systemic sclerosis, unspecified: Secondary | ICD-10-CM

## 2024-08-02 DIAGNOSIS — M0600 Rheumatoid arthritis without rheumatoid factor, unspecified site: Secondary | ICD-10-CM

## 2024-08-02 DIAGNOSIS — L943 Sclerodactyly: Secondary | ICD-10-CM

## 2024-08-02 DIAGNOSIS — Z79899 Other long term (current) drug therapy: Secondary | ICD-10-CM

## 2024-08-02 MED ORDER — MYCOPHENOLATE MOFETIL 500 MG PO TABS: 500.0000 mg | ORAL_TABLET | Freq: Two times a day (BID) | ORAL | 2 refills | Status: AC

## 2024-08-02 NOTE — Progress Notes (Unsigned)
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 elevated T2 qualitatively but 54 ms (normal) on T2 mapping. ECV elevated 40% in the inferior apex. There is no late gadolinium enhancement in the left ventricular myocardium. Normal resting perfusion. 2. Normal right ventricular size with RVEDVI 74 mL/m2. Normal right ventricular thickness. Mild decrease in right ventricular systolic function (RVEF =44%). There are no regional wall motion abnormalities or aneurysms. There is no RV collapse. 3.  Normal left and right atrial size. 4. Normal size of the aortic root, ascending aorta and pulmonary artery. 5. Valve assessment: Aortic Valve: Tri-leaflet aortic valve. Qualitatively, there is no significant regurgitation. Regurgitant fraction < 1%. Pulmonic Valve: Qualitatively, there is no significant regurgitation. Regurgitant fraction < 1%. Tricuspid Valve: Tri-leaflet valve. Qualitatively, there is no significant regurgitation. Regurgitant fraction 6%. Mitral Valve: Qualitatively, there is no significant regurgitation. Regurgitant fraction 1%. 6. Normal pericardium without pericardial thickening. There is no  pericardial hyper-enhancement. There is no IVC plethora, abnormal interventricular wall motion, or collapse of the right atrium or ventricle. There is a moderate pericardial effusion. Though the effusion is circumferential; there is an apical predominance. Apical epicardial fat noted. 7. Grossly, no extracardiac findings. Small (3 mm) subpleural right middle lung nodule. Recommended dedicated study if concerned for non-cardiac pathology. 8.  Free breathing artifacts  noted. IMPRESSION: 1. Mild decrease in left ventricular systolic function (LVEF =47%). Evidence of edema visually on T2 TSE sequence. 2. Mild decrease in right ventricular systolic function (RVEF =44%). 3. Moderate, circumferential pericardial effusion with apical predominance. [name] MD Electronically Signed   By: [name] M.D.   On: 07/04/2024 21:21   MR CARDIAC VELOCITY FLOW MAP Result Date: 07/04/2024 CLINICAL DATA:  Clinical question of pericardial effusion Study assumes HCT of 38 and BSA of 1.51 m2. EXAM: CARDIAC MRI TECHNIQUE: The patient was scanned on a 1.5 Tesla GE magnet. A dedicated cardiac coil was used. Functional imaging was done using [employer] sequences. 2,3, and 4 chamber views were done to assess for RWMA's. Modified Simpson's rule using was used to calculate an ejection fraction on a dedicated work [employer]. The patient received 8 cc of Gadavist . After 10 minutes inversion recovery sequences were used to assess for infiltration and scar tissue. Flow quantification was performed 2 times during this examination with flow quantification performed at the levels of the ascending aorta above the valve, pulmonary artery above the valve. CONTRAST:  8 cc  of Gadavist  FINDINGS: 1. Normal left ventricular size, with LVEDD 44 mm, and LVEDVi 69 mL/m2. Normal left ventricular thickness. Mild decrease in left ventricular systolic function (LVEF =47%). There are no regional wall motion abnormalities but  global hypokinesis. Left ventricular parametric mapping notable for elevated T2 qualitatively but 54 ms (normal) on T2 mapping. ECV elevated 40% in the inferior apex. There is no late gadolinium enhancement in the left ventricular myocardium. Normal resting perfusion. 2. Normal right ventricular size with RVEDVI 74 mL/m2. Normal right ventricular thickness. Mild decrease in right ventricular systolic function (RVEF =44%). There are no regional wall motion abnormalities or aneurysms. There is no RV collapse. 3.  Normal left and right atrial size. 4. Normal size of the aortic root, ascending aorta and pulmonary artery. 5. Valve assessment: Aortic Valve: Tri-leaflet aortic valve. Qualitatively, there is no significant regurgitation. Regurgitant fraction < 1%. Pulmonic Valve: Qualitatively, there is no significant regurgitation. Regurgitant fraction < 1%. Tricuspid Valve: Tri-leaflet valve. Qualitatively, there is no significant regurgitation. Regurgitant fraction 6%. Mitral Valve: Qualitatively, there is no significant regurgitation. Regurgitant fraction 1%. 6. Normal pericardium without pericardial thickening. There is no pericardial hyper-enhancement. There is no IVC plethora, abnormal interventricular wall motion, or collapse of the right atrium or ventricle. There is a moderate pericardial effusion. Though the effusion is circumferential; there is an apical predominance. Apical epicardial fat noted. 7. Grossly, no extracardiac findings. Small (3 mm) subpleural right middle lung nodule. Recommended dedicated study if concerned for non-cardiac pathology. 8.  Free breathing artifacts noted. IMPRESSION: 1. Mild decrease in left ventricular systolic function (LVEF =47%). Evidence of edema visually on T2 TSE sequence. 2. Mild decrease in right ventricular systolic function (RVEF =44%). 3. Moderate, circumferential pericardial effusion with apical predominance. [name] MD Electronically Signed   By: [name] M.D.   On: 07/04/2024 21:21    Recent Labs: Lab Results  Component Value Date   WBC 6.5 07/11/2024   HGB 11.3 07/11/2024   PLT 362 07/11/2024   NA 141 07/11/2024   K 4.1 07/11/2024   CL 104 07/11/2024   CO2 21 07/11/2024   GLUCOSE 84 07/11/2024   BUN 13 07/11/2024   CREATININE 0.43 (L) 07/11/2024   BILITOT 0.4 07/11/2024   ALKPHOS 63 07/11/2024   AST 30 07/11/2024   ALT 31 07/11/2024   PROT 7.1  07/11/2024   ALBUMIN 3.9 07/11/2024   CALCIUM 9.2 07/11/2024   GFRAA >60 03/28/2017    Speciality Comments: Actemra  IV started 04/09/24  Procedures:  No procedures performed Allergies: Penicillins and Folic acid    Assessment / Plan:     Visit Diagnoses:  Assessment & Plan High risk medication use     Systemic sclerosis (HCC)  Orders:   mycophenolate  (CELLCEPT ) 500 MG tablet; Take 1 tablet (500 mg total) by mouth 2 (two) times daily.   Ambulatory referral to Occupational Therapy  Raynaud's phenomenon without gangrene     Sclerodactyly  Orders:   Ambulatory referral to Occupational Therapy  Seronegative rheumatoid arthritis (HCC)     Pericardial effusion      ***  Assessment and Plan    Scleroderma with skin, musculoskeletal, and cardiac involvement Scleroderma with significant skin, musculoskeletal, and cardiac involvement. Actemra  beneficial. Transitioning from methotrexate  to mycophenolate  for skin and lung efficacy. Prednisone  avoided due to complications. - Start mycophenolate  500 mg twice daily. - Monitor for gastrointestinal side effects; consider alternatives if needed. - Continue Actemra  infusions. - Avoid prednisone .  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 - Await echocardiogram results to assess cardiac status.  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 - Continue monitoring cardiac status.  Raynaud's phenomenon Raynaud's phenomenon contributing to foot symptoms, possibly exacerbated by scleroderma. - Monitor symptoms and manage conservatively.  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 Gabapentin avoided due to side effects.        Follow-Up Instructions: Return in about 2 months (around 09/30/2024) for SSc/pulmHTN TOC/MMF switch f/u 2mos.   Lonni LELON Ester, MD  Note - This record has been created using [employer].  Chart creation errors have been sought, but may not always  have been located. Such creation errors do not reflect on  the standard of medical care. "

## 2024-08-02 NOTE — Assessment & Plan Note (Addendum)
 Orders:    Ambulatory referral to Occupational Therapy

## 2024-08-02 NOTE — Assessment & Plan Note (Addendum)
" °  Orders:   mycophenolate  (CELLCEPT ) 500 MG tablet; Take 1 tablet (500 mg total) by mouth 2 (two) times daily.   Ambulatory referral to Occupational Therapy  "

## 2024-08-02 NOTE — Assessment & Plan Note (Addendum)
 SABRA

## 2024-08-02 NOTE — Assessment & Plan Note (Addendum)
 Erin Higgins

## 2024-08-02 NOTE — Patient Instructions (Signed)
 You can start taking the mycophenolate  in place of methotrexate . We would recheck your lab test for monitoring this in March. If you have significant trouble taking the medicine let us  know there are some alternative forms that can be easier on the stomach and for swallowing.

## 2024-08-05 ENCOUNTER — Ambulatory Visit

## 2024-08-08 ENCOUNTER — Ambulatory Visit

## 2024-08-12 ENCOUNTER — Ambulatory Visit

## 2024-08-15 ENCOUNTER — Ambulatory Visit

## 2024-08-15 ENCOUNTER — Ambulatory Visit (HOSPITAL_COMMUNITY): Attending: Internal Medicine | Admitting: Occupational Therapy

## 2024-08-19 ENCOUNTER — Ambulatory Visit

## 2024-08-19 ENCOUNTER — Ambulatory Visit (HOSPITAL_COMMUNITY): Admission: RE | Admit: 2024-08-19 | Source: Ambulatory Visit

## 2024-08-22 ENCOUNTER — Ambulatory Visit

## 2024-10-02 ENCOUNTER — Encounter: Attending: Internal Medicine | Admitting: Internal Medicine

## 2024-10-28 ENCOUNTER — Ambulatory Visit: Admitting: Internal Medicine
